# Patient Record
Sex: Female | Born: 1955 | Race: White | Hispanic: No | Marital: Married | Smoking: Never smoker
Health system: Southern US, Community
[De-identification: ages and names within clinical notes are randomized; demographics above are authoritative.]

## PROBLEM LIST (undated history)

## (undated) ENCOUNTER — Emergency Department (HOSPITAL_COMMUNITY): Payer: Medicare Other

## (undated) DIAGNOSIS — E079 Disorder of thyroid, unspecified: Secondary | ICD-10-CM

## (undated) DIAGNOSIS — F419 Anxiety disorder, unspecified: Secondary | ICD-10-CM

## (undated) DIAGNOSIS — M1990 Unspecified osteoarthritis, unspecified site: Secondary | ICD-10-CM

## (undated) DIAGNOSIS — I10 Essential (primary) hypertension: Secondary | ICD-10-CM

## (undated) DIAGNOSIS — R6889 Other general symptoms and signs: Secondary | ICD-10-CM

## (undated) DIAGNOSIS — R232 Flushing: Secondary | ICD-10-CM

## (undated) DIAGNOSIS — R002 Palpitations: Secondary | ICD-10-CM

## (undated) DIAGNOSIS — Z87891 Personal history of nicotine dependence: Secondary | ICD-10-CM

## (undated) DIAGNOSIS — R5383 Other fatigue: Secondary | ICD-10-CM

## (undated) DIAGNOSIS — R05 Cough: Secondary | ICD-10-CM

## (undated) HISTORY — DX: Cough: R05

## (undated) HISTORY — DX: Flushing: R23.2

## (undated) HISTORY — DX: Other general symptoms and signs: R68.89

## (undated) HISTORY — PX: NASAL SEPTUM SURGERY: SHX37

## (undated) HISTORY — DX: Disorder of thyroid, unspecified: E07.9

## (undated) HISTORY — PX: COLONOSCOPY: SHX174

## (undated) HISTORY — DX: Unspecified osteoarthritis, unspecified site: M19.90

## (undated) HISTORY — DX: Personal history of nicotine dependence: Z87.891

## (undated) HISTORY — DX: Anxiety disorder, unspecified: F41.9

## (undated) HISTORY — PX: ABDOMINAL HYSTERECTOMY: SHX81

## (undated) HISTORY — DX: Other fatigue: R53.83

## (undated) HISTORY — DX: Palpitations: R00.2

## (undated) HISTORY — DX: Essential (primary) hypertension: I10

---

## 1988-03-05 HISTORY — PX: PAROTIDECTOMY: SHX2163

## 1996-03-05 HISTORY — PX: OTHER SURGICAL HISTORY: SHX169

## 1997-08-31 ENCOUNTER — Other Ambulatory Visit: Admission: RE | Admit: 1997-08-31 | Discharge: 1997-08-31 | Payer: Self-pay | Admitting: Obstetrics and Gynecology

## 2003-10-28 ENCOUNTER — Emergency Department (HOSPITAL_COMMUNITY): Admission: EM | Admit: 2003-10-28 | Discharge: 2003-10-28 | Payer: Self-pay | Admitting: Emergency Medicine

## 2004-07-10 ENCOUNTER — Ambulatory Visit: Payer: Self-pay | Admitting: Internal Medicine

## 2004-07-18 ENCOUNTER — Ambulatory Visit (HOSPITAL_COMMUNITY): Admission: RE | Admit: 2004-07-18 | Discharge: 2004-07-18 | Payer: Self-pay | Admitting: Internal Medicine

## 2005-01-08 ENCOUNTER — Ambulatory Visit: Payer: Self-pay | Admitting: Internal Medicine

## 2005-01-11 ENCOUNTER — Ambulatory Visit: Payer: Self-pay | Admitting: Internal Medicine

## 2007-12-08 ENCOUNTER — Encounter: Admission: RE | Admit: 2007-12-08 | Discharge: 2007-12-08 | Payer: Self-pay | Admitting: Orthopedic Surgery

## 2008-01-04 HISTORY — PX: BACK SURGERY: SHX140

## 2008-01-21 ENCOUNTER — Ambulatory Visit: Payer: Self-pay | Admitting: *Deleted

## 2008-01-21 ENCOUNTER — Inpatient Hospital Stay (HOSPITAL_COMMUNITY): Admission: RE | Admit: 2008-01-21 | Discharge: 2008-01-23 | Payer: Self-pay | Admitting: Orthopedic Surgery

## 2008-01-22 ENCOUNTER — Encounter (INDEPENDENT_AMBULATORY_CARE_PROVIDER_SITE_OTHER): Payer: Self-pay | Admitting: Orthopedic Surgery

## 2008-02-03 ENCOUNTER — Encounter: Admission: RE | Admit: 2008-02-03 | Discharge: 2008-02-03 | Payer: Self-pay | Admitting: Orthopedic Surgery

## 2008-03-23 ENCOUNTER — Encounter: Admission: RE | Admit: 2008-03-23 | Discharge: 2008-05-06 | Payer: Self-pay | Admitting: Orthopedic Surgery

## 2008-09-23 ENCOUNTER — Encounter: Admission: RE | Admit: 2008-09-23 | Discharge: 2008-09-23 | Payer: Self-pay | Admitting: Cardiology

## 2010-01-31 ENCOUNTER — Encounter: Admission: RE | Admit: 2010-01-31 | Discharge: 2010-01-31 | Payer: Self-pay | Admitting: Obstetrics and Gynecology

## 2010-07-18 NOTE — Op Note (Signed)
NAMEYEE, GANGI NO.:  [id number]   MEDICAL RECORD NO.:  [id number]          PATIENT TYPE:  INP   LOCATION:  5007                         FACILITY:  [facility]   PHYSICIAN:  Alvy Beal, MD    DATE OF BIRTH:  [birth year]   DATE OF PROCEDURE:  01/21/2008  DATE OF DISCHARGE:                               OPERATIVE REPORT   PREOPERATIVE DIAGNOSIS:  Degenerative disk disease with slight  anterolisthesis L4-5.   POSTOPERATIVE DIAGNOSIS:  Degenerative disk disease with slight  anterolisthesis L4-5.   OPERATIVE PROCEDURE:  Anterior lumbar interbody fusion L4-5  instrumentation using a Stryker size 14 small interbody spacer with a 14-  mm RSB anterior plating device with 25-mm locking screws.   COMPLICATIONS:  None.   ESTIMATED BLOOD LOSS:  200 mL.   HISTORY:  This is a very pleasant 55 year old woman who has had chronic  debilitating low back pain secondary to degenerative disk disease with  instability and spinal stenosis.  The patient has tried and failed  prolonged conservative management consisting of physical therapy,  injection therapy, and narcotic medications.  Despite these modalities,  her symptoms progressed and her quality of life has suffered.  As a  result of this, we elected to proceed to surgery.  All appropriate  risks, benefits and alternatives were discussed with the patient.  Consent was obtained.   OPERATIVE NOTE:  The patient was brought to the operating room and  placed supine on the operating table.  After successful induction of  general anesthesia and endotracheal intubation, TEDs, SCDs and Foley  were applied.  The patient was flipped and then the abdomen was prepped  and draped in a standard fashion.  Dr. Madilyn Fireman, the vascular surgeon then  scrubbed in to perform a standard anterior approach to the spine.  Please refer to his dictation for specifics of the approach.  Once we  had the retractors in, we placed a needle into the L4-5  disk space, took  an x-ray confirming that we are at the appropriate level.  At this point  with the L4-5 level confirmed, I then incised the annulus with a 10  blade scalpel and began to resect the disk space.  Using a combination  of curettes, pituitary rongeurs and Kerrison rongeurs, I removed the  entire L4-5 disk material.  I then placed a lamina spreader and  distracted slightly to the posterior annulus.  I then used a fine angled  curette to strip the annulus from the posterior aspect of the bodies of  L4 and L5.  I then used a 3-mm Kerrison to resect the posterior  osteophyte.  At this point, I had excellent posterior decompression and  release of the annulus. Under live fluoro, I could clearly see parallel  distraction of the endplates.  I then rasped the endplates and measured  the interbody space.  Once I had the appropriate size, I obtained  hemostasis and then packed the appropriate size Stryker PEEK interbody  cage with Actifuse and used the threaded inserting device to place it to  the appropriate depth.  I confirmed trajectory in  position in the AP and  lateral planes.   Once the interbody device was properly seated, I used the remaining  portion of the Actifuse, packed it anterior to the plate anterior to the  interbody device for sentinel fusion.  I then obtained the RSB anterior  lumbar zero profile plate and positioned it appropriately over the  interbody graft.  I then used an awl to breach the cortex and placed two  screws under fluoroscopic guidance through the targeting device into the  body of L4.  I then placed a single screw into the body of L5 again  confirming trajectory in position in the lateral plane.  We had all  three screws properly locked.  I noted I had excellent purchase of the  screws and good lag fixation.  I then irrigated the wound copiously with  normal saline, put the anterior locking plate, secured it to the plate  to prevent back out of the  screws.   I then irrigated again copiously with normal saline and then removed the  retractors and checked to ensure that there was no significant  hemorrhage.  I used bipolar electrocautery to obtain hemostasis and  maintained it with FloSeal.  I then closed the fascia of the rectus  sheath with interrupted #1 Vicryl sutures and closed the adipose tissue  with a layered 0 Vicryl runner and interrupted 2-0 Vicryl sutures.  I  then used a 3-0 Monocryl for the skin.  Final AP and lateral fluoro  views demonstrated satisfactory position of the hardware and the  interbody device in both the lateral and AP planes.  I then took a final  intraoperative digital AP film which confirmed that there was no  retained fragments.  The needle and sponge counts were correct.  The  patient was ultimately extubated and transferred to the PACU without  incident.      Alvy Beal, MD  Electronically Signed     DDB/MEDQ  D:  01/21/2008  T:  01/21/2008  Job:  [id number]   cc:   Balinda Quails, M.D.

## 2010-07-18 NOTE — Op Note (Signed)
NAMEMarland Kitchen  SHAUNIKA, ITALIANO NO.:  [id number]   MEDICAL RECORD NO.:  [id number]          PATIENT TYPE:  INP   LOCATION:  5007                         FACILITY:  [facility]   PHYSICIAN:  Balinda Quails, M.D.    DATE OF BIRTH:  1955/06/19   DATE OF PROCEDURE:  01/21/2008  DATE OF DISCHARGE:                               OPERATIVE REPORT   SURGEON:  Balinda Quails, MD   CO-SURGEON:  Alvy Beal, MD   ANESTHETIC:  General endotracheal.   PREOPERATIVE DIAGNOSIS:  L4-5 degenerative disk disease.   POSTOPERATIVE DIAGNOSIS:  L4-5 degenerative disk disease.   PROCEDURE:  L4-5 anterior lumbar interbody fusion (ALIF).   CLINICAL NOTE:  Anna Moore is a 55 year old female scheduled this  time to undergo L4-5 ALIF.  The patient was evaluated preoperatively in  the holding area.  No contraindications to surgery.  Normal lower  extremity pulses.  No history of DVT or pulmonary embolism.   Procedure reviewed with the patient preoperatively and she consented for  surgery.  Potential risks were reviewed.   OPERATIVE PROCEDURE:  The patient was brought to the operating room in  stable condition.  Placed under general endotracheal anesthesia.  In the  supine position, the abdomen was prepped and draped in a sterile  fashion.  Foley catheter, arterial line, central venous catheter, and  pulse oximetry in the left foot.   A transverse skin incision was made from midline to the lateral margin  of the left rectus muscle at the projection of L4-5 and the anterior  abdominal wall.  Subcutaneous tissue divided by electrocautery.  Left  anterior rectus sheath was incised from midline to lateral margin of the  rectus.  Superior and inferior rectus sheath flaps were created.  The  rectus muscle was mobilized and retracted medially.  The left  retroperitoneal space was entered, the common peritoneum pushed off the  posterior rectus sheath, which was incised longitudinally.  The  peritoneum was then mobilized anteriorly and the psoas muscle and  genitofemoral nerve identified and preserved.  The left common and  external iliac artery was skeletonized, pushing the lymphatics  laterally.  The ureter was mobilized medially with abdominal contents.  The left common iliac vein was exposed and the left external iliac vein  was mobilized.  Three distinct iliolumbar branches of the common iliac  vein were identified, ligated with 2-0 silk and divided.  The L4  segmental vessels were ligated with clips and divided.  The L4-5 disk  was palpated and the soft tissue was pushed off the disk to the right.  Middle sacral vessel was ligated with clips and divided.  The disk was  fully exposed from left to right.   Reverse lip Brau blades were used to expose the disk, placing the blades  on the lateral margins of L4-5 bilaterally with malleable retractor was  placed inferiorly and superiorly.   The L4-5 disk was verified by fluoroscopy.   Dr. Shon Baton then completed L4-5 ALIF.  Details closure dictated under  separate heading by Dr. Shon Baton.      Balinda Quails, M.D.  Electronically Signed     PGH/MEDQ  D:  01/21/2008  T:  01/22/2008  Job:  [id number]   cc:   Alvy Beal, MD

## 2010-12-05 LAB — CBC
HCT: 44.7
Hemoglobin: 15
MCHC: 33.6
MCV: 96.4
Platelets: 296
RBC: 4.64
RDW: 13.2
WBC: 8.5

## 2010-12-05 LAB — TYPE AND SCREEN
ABO/RH(D): O POS
Antibody Screen: NEGATIVE

## 2010-12-05 LAB — ABO/RH: ABO/RH(D): O POS

## 2011-04-30 ENCOUNTER — Encounter: Payer: Self-pay | Admitting: Gastroenterology

## 2011-05-30 ENCOUNTER — Other Ambulatory Visit: Payer: Self-pay | Admitting: Gastroenterology

## 2011-08-07 ENCOUNTER — Encounter: Payer: Self-pay | Admitting: Cardiology

## 2012-04-30 ENCOUNTER — Ambulatory Visit (INDEPENDENT_AMBULATORY_CARE_PROVIDER_SITE_OTHER): Payer: No Typology Code available for payment source | Admitting: Family Medicine

## 2012-04-30 ENCOUNTER — Encounter: Payer: Self-pay | Admitting: Family Medicine

## 2012-04-30 ENCOUNTER — Encounter: Payer: Self-pay | Admitting: Gastroenterology

## 2012-04-30 VITALS — BP 150/88 | HR 88 | Temp 98.1°F | Wt 144.0 lb

## 2012-04-30 DIAGNOSIS — F419 Anxiety disorder, unspecified: Secondary | ICD-10-CM

## 2012-04-30 DIAGNOSIS — Z780 Asymptomatic menopausal state: Secondary | ICD-10-CM

## 2012-04-30 DIAGNOSIS — L301 Dyshidrosis [pompholyx]: Secondary | ICD-10-CM

## 2012-04-30 DIAGNOSIS — F17200 Nicotine dependence, unspecified, uncomplicated: Secondary | ICD-10-CM

## 2012-04-30 DIAGNOSIS — R002 Palpitations: Secondary | ICD-10-CM | POA: Insufficient documentation

## 2012-04-30 DIAGNOSIS — M1990 Unspecified osteoarthritis, unspecified site: Secondary | ICD-10-CM

## 2012-04-30 DIAGNOSIS — F411 Generalized anxiety disorder: Secondary | ICD-10-CM

## 2012-04-30 DIAGNOSIS — Z1211 Encounter for screening for malignant neoplasm of colon: Secondary | ICD-10-CM

## 2012-04-30 DIAGNOSIS — I10 Essential (primary) hypertension: Secondary | ICD-10-CM

## 2012-04-30 DIAGNOSIS — Z136 Encounter for screening for cardiovascular disorders: Secondary | ICD-10-CM

## 2012-04-30 DIAGNOSIS — N959 Unspecified menopausal and perimenopausal disorder: Secondary | ICD-10-CM

## 2012-04-30 DIAGNOSIS — Z720 Tobacco use: Secondary | ICD-10-CM

## 2012-04-30 LAB — COMPREHENSIVE METABOLIC PANEL
Alkaline Phosphatase: 62 U/L (ref 39–117)
BUN: 13 mg/dL (ref 6–23)
Creatinine, Ser: 0.7 mg/dL (ref 0.4–1.2)
Glucose, Bld: 92 mg/dL (ref 70–99)
Sodium: 138 mEq/L (ref 135–145)
Total Bilirubin: 0.8 mg/dL (ref 0.3–1.2)

## 2012-04-30 LAB — LIPID PANEL
Cholesterol: 212 mg/dL — ABNORMAL HIGH (ref 0–200)
HDL: 68.8 mg/dL (ref >39.00)
Total CHOL/HDL Ratio: 3
Triglycerides: 121 mg/dL (ref 0.0–149.0)
VLDL: 24.2 mg/dL (ref 0.0–40.0)

## 2012-04-30 MED ORDER — ESTRADIOL 1 MG PO TABS: 1.0000 mg | ORAL_TABLET | Freq: Every day | ORAL | Status: DC

## 2012-04-30 MED ORDER — HYDROCHLOROTHIAZIDE 12.5 MG PO CAPS: 12.5000 mg | ORAL_CAPSULE | Freq: Every day | ORAL | Status: DC

## 2012-04-30 MED ORDER — TRIAMCINOLONE ACETONIDE 0.1 % EX CREA: TOPICAL_CREAM | Freq: Two times a day (BID) | CUTANEOUS | Status: DC

## 2012-04-30 MED ORDER — AMLODIPINE BESYLATE 5 MG PO TABS: 5.0000 mg | ORAL_TABLET | Freq: Every day | ORAL | Status: DC

## 2012-04-30 NOTE — Progress Notes (Signed)
Subjective:    Patient ID: Anna Moore, female    DOB: October 23, 1955, 57 y.o.   MRN: [id number]  HPI  Very pleasant G1P1 here to establish care.  HTN- on HCTZ 12.5 mg and norvasc 5 mg daily. Could not tolerate lisinopril- "made me feel wiped out." Nervous about meeting me today, states normally BP runs in 130s/70s-80s.  No HA, blurred vision, CP or SOB.  She is currently menopausal- s/p hysterectomy but does still have ovaries.  On estrace- attempted to wean off but hot flashes and insomnia were unbearable. She does still smoke 1-2 cigarettes per week.  Rash on her heels bilaterally- itchy, peeling skin for past 6 months.  Moisturizing only helps a little bit.  Patient Active Problem List  Diagnosis  . Hypertension  . Anxiety  . DJD (degenerative joint disease)  . Palpitations  . Post menopausal problems  . Tobacco abuse  . Eczema, dyshidrotic   Past Medical History  Diagnosis Date  . Cough     Questionably ACE related  . Fatigue     Significant fatigue in the setting of back pain and diaphoresis  . History of tobacco abuse   . Fatigue   . Palpitations   . Forgetfulness   . Hot flashes   . Hypertension   . Anxiety   . DJD (degenerative joint disease)     Of the back   Past Surgical History  Procedure Laterality Date  . Back surgery  01/2008    On L4 and L5  . Other surgical history  1998    Hysterectomy  . Parotidectomy  1990  . Nasal septum surgery    . Abdominal hysterectomy     History  Substance Use Topics  . Smoking status: Current Some Day Smoker  . Smokeless tobacco: Not on file  . Alcohol Use: Yes   Family History  Problem Relation Age of Onset  . Arrhythmia Mother   . Hypertension Father   . Heart disease Father   . Heart attack Father     59's  . Cancer Father     non hodgkins lymphoma   No Known Allergies No current outpatient prescriptions on file prior to visit.   No current facility-administered medications on file prior to  visit.   The PMH, PSH, Social History, Family History, Medications, and allergies have been reviewed in [facility], and have been updated if relevant.   Review of Systems See HPI Patient reports no  vision/ hearing changes,anorexia, weight change, fever ,adenopathy, persistant / recurrent hoarseness, swallowing issues, chest pain, edema,persistant / recurrent cough, hemoptysis, dyspnea(rest, exertional, paroxysmal nocturnal), gastrointestinal  bleeding (melena, rectal bleeding), abdominal pain, excessive heart burn, GU symptoms(dysuria, hematuria, pyuria, voiding/incontinence  Issues) syncope, focal weakness, severe memory loss, concerning skin lesions, depression, anxiety, abnormal bruising/bleeding, major joint swelling, breast masses or abnormal vaginal bleeding.       Objective:   Physical Exam BP 150/88  Pulse 88  Temp(Src) 98.1 F (36.7 C)  Wt 144 lb (65.318 kg)  General:  Well-developed,well-nourished,in no acute distress; alert,appropriate and cooperative throughout examination Head:  normocephalic and atraumatic.   Eyes:  vision grossly intact, pupils equal, pupils round, and pupils reactive to light.   Ears:  R ear normal and L ear normal.   Nose:  no external deformity.   Mouth:  good dentition.   Neck:  No deformities, masses, or tenderness noted. Lungs:  Normal respiratory effort, chest expands symmetrically. Lungs are clear to auscultation, no crackles or wheezes. Heart:  Normal rate and regular rhythm. S1 and S2 normal without gallop, murmur, click, rub or other extra sounds. Abdomen:  Bowel sounds positive,abdomen soft and non-tender without masses, organomegaly or hernias noted. Msk:  No deformity or scoliosis noted of thoracic or lumbar spine.   Extremities:  No clubbing, cyanosis, edema, or deformity noted with normal full range of motion of all joints.   Neurologic:  alert & oriented X3 and gait normal.   Skin:  Dry peeling skin on heels bilaterally, no erythema or  drainage Cervical Nodes:  No lymphadenopathy noted Axillary Nodes:  No palpable lymphadenopathy Psych:  Cognition and judgment appear intact. Alert and cooperative with normal attention span and concentration. No apparent delusions, illusions, hallucinations     Assessment & Plan:  1. Hypertension A little elevated today, likely due to white coat HTN.  She is asymptomatic and will monitor BP at home.  Rxs refilled, no changes made. - Comprehensive metabolic panel  2. Screening for ischemic heart disease  - Lipid Panel  3. Post menopausal problems Unable to tolerate Estrace wean. Advised STOPPING cigarette smoking as it does increase her risk of blood clots. UTD on mammogram  4. Tobacco abuse See above  5. Special screening for malignant neoplasms, colon  - Ambulatory referral to Gastroenterology  6. Eczema, dyshidrotic Triamcinolone cream to area twice daily x 10 days or until area clears. Call or return to clinic prn if these symptoms worsen or fail to improve as anticipated. The patient indicates understanding of these issues and agrees with the plan.

## 2012-04-30 NOTE — Patient Instructions (Addendum)
Nice to meet you. We will call you with your lab results.  Please stop by to see Shirlee Limerick on your way out to set up your colonoscopy.

## 2012-05-01 ENCOUNTER — Encounter: Payer: Self-pay | Admitting: *Deleted

## 2012-05-19 ENCOUNTER — Ambulatory Visit (AMBULATORY_SURGERY_CENTER): Payer: No Typology Code available for payment source | Admitting: *Deleted

## 2012-05-19 VITALS — Ht 65.75 in | Wt 144.8 lb

## 2012-05-19 DIAGNOSIS — Z1211 Encounter for screening for malignant neoplasm of colon: Secondary | ICD-10-CM

## 2012-05-19 MED ORDER — NA SULFATE-K SULFATE-MG SULF 17.5-3.13-1.6 GM/177ML PO SOLN: ORAL | Status: DC

## 2012-05-23 ENCOUNTER — Telehealth: Payer: Self-pay

## 2012-05-23 MED ORDER — AMLODIPINE BESYLATE 10 MG PO TABS: ORAL_TABLET | ORAL | Status: DC

## 2012-05-23 MED ORDER — AMLODIPINE BESYLATE 10 MG PO TABS: 5.0000 mg | ORAL_TABLET | Freq: Every day | ORAL | Status: DC

## 2012-05-23 MED ORDER — ESTRADIOL 1 MG PO TABS: 1.0000 mg | ORAL_TABLET | Freq: Two times a day (BID) | ORAL | Status: DC

## 2012-05-23 MED ORDER — HYDROCHLOROTHIAZIDE 12.5 MG PO CAPS: 12.5000 mg | ORAL_CAPSULE | Freq: Every day | ORAL | Status: DC

## 2012-05-23 NOTE — Telephone Encounter (Signed)
Yes, let's increase her norvasc first to 10 mg daily, continue current dose of HCTZ.  Keep checking BP and call us back next week.  Rx sent to [facility].

## 2012-05-23 NOTE — Telephone Encounter (Signed)
Pt left v/m; pt seen on 04/30/12 and BP readings are higher and pt was to call back with readings and get amlodipine and HCTZ increased. Pt presently taking amlodipine 5 mg one daily and HCTZ 12.5 mg one daily. Once decision made if med needs to be increased pt request refill for both meds to [facility], Pts BP readings averaging 150-156 / 90-99. No h/a, dizziness, CP or SOB. Pt request call back.

## 2012-05-23 NOTE — Telephone Encounter (Signed)
Advised patient as instructed.  Amlodipine instructions changed on med list and new script sent to pharmacy.

## 2012-05-28 ENCOUNTER — Encounter: Payer: Self-pay | Admitting: Gastroenterology

## 2012-06-02 ENCOUNTER — Encounter: Payer: Self-pay | Admitting: Gastroenterology

## 2012-06-02 ENCOUNTER — Ambulatory Visit (AMBULATORY_SURGERY_CENTER): Payer: No Typology Code available for payment source | Admitting: Gastroenterology

## 2012-06-02 VITALS — BP 123/77 | HR 71 | Temp 96.5°F | Resp 24 | Ht 65.75 in | Wt 144.0 lb

## 2012-06-02 DIAGNOSIS — D126 Benign neoplasm of colon, unspecified: Secondary | ICD-10-CM

## 2012-06-02 DIAGNOSIS — Z1211 Encounter for screening for malignant neoplasm of colon: Secondary | ICD-10-CM

## 2012-06-02 MED ORDER — SODIUM CHLORIDE 0.9 % IV SOLN: 500.0000 mL | INTRAVENOUS | Status: DC

## 2012-06-02 NOTE — Patient Instructions (Addendum)
A handout was given to your care partner on polyps.  You may resume your current medications today.  Please call if any questions or concerns.    YOU HAD AN ENDOSCOPIC PROCEDURE TODAY AT THE [facility]: Refer to the procedure report that was given to you for any specific questions about what was found during the examination.  If the procedure report does not answer your questions, please call your gastroenterologist to clarify.  If you requested that your care partner not be given the details of your procedure findings, then the procedure report has been included in a sealed envelope for you to review at your convenience later.  YOU SHOULD EXPECT: Some feelings of bloating in the abdomen. Passage of more gas than usual.  Walking can help get rid of the air that was put into your GI tract during the procedure and reduce the bloating. If you had a lower endoscopy (such as a colonoscopy or flexible sigmoidoscopy) you may notice spotting of blood in your stool or on the toilet paper. If you underwent a bowel prep for your procedure, then you may not have a normal bowel movement for a few days.  DIET: Your first meal following the procedure should be a light meal and then it is ok to progress to your normal diet.  A half-sandwich or bowl of soup is an example of a good first meal.  Heavy or fried foods are harder to digest and may make you feel nauseous or bloated.  Likewise meals heavy in dairy and vegetables can cause extra gas to form and this can also increase the bloating.  Drink plenty of fluids but you should avoid alcoholic beverages for 24 hours.  ACTIVITY: Your care partner should take you home directly after the procedure.  You should plan to take it easy, moving slowly for the rest of the day.  You can resume normal activity the day after the procedure however you should NOT DRIVE or use heavy machinery for 24 hours (because of the sedation medicines used during the test).    SYMPTOMS  TO REPORT IMMEDIATELY: A gastroenterologist can be reached at any hour.  During normal business hours, 8:30 AM to 5:00 PM Monday through Friday, call [phone].  After hours and on weekends, please call the GI answering service at [phone] who will take a message and have the physician on call contact you.   Following lower endoscopy (colonoscopy or flexible sigmoidoscopy):  Excessive amounts of blood in the stool  Significant tenderness or worsening of abdominal pains  Swelling of the abdomen that is new, acute  Fever of 100F or higher    FOLLOW UP: If any biopsies were taken you will be contacted by phone or by letter within the next 1-3 weeks.  Call your gastroenterologist if you have not heard about the biopsies in 3 weeks.  Our staff will call the home number listed on your records the next business day following your procedure to check on you and address any questions or concerns that you may have at that time regarding the information given to you following your procedure. This is a courtesy call and so if there is no answer at the home number and we have not heard from you through the emergency physician on call, we will assume that you have returned to your regular daily activities without incident.  SIGNATURES/CONFIDENTIALITY: You and/or your care partner have signed paperwork which will be entered into your electronic medical record.    These signatures attest to the fact that that the information above on your After Visit Summary has been reviewed and is understood.  Full responsibility of the confidentiality of this discharge information lies with you and/or your care-partner.  

## 2012-06-02 NOTE — Progress Notes (Signed)
1047patient watching procedure denies discomfort.

## 2012-06-02 NOTE — Progress Notes (Signed)
No complaints noted in the recovery room. Maw  Patient did not experience any of the following events: a burn prior to discharge; a fall within the facility; wrong site/side/patient/procedure/implant event; or a hospital transfer or hospital admission upon discharge from the facility. (G8907) Patient did not have preoperative order for IV antibiotic SSI prophylaxis. (G8918)  

## 2012-06-02 NOTE — Op Note (Signed)
[facility] [address] [place], [address]   COLONOSCOPY PROCEDURE REPORT  PATIENT: Anna Moore, Anna Moore  MR#: [id number] BIRTHDATE: 02/19/1956 , 56  yrs. old GENDER: Female ENDOSCOPIST: Rachael Fee, MD REFERRED WG:NFAOZ Aron, M.D. PROCEDURE DATE:  06/02/2012 PROCEDURE:   Colonoscopy with snare polypectomy ASA CLASS:   Class II INDICATIONS:average risk screening. MEDICATIONS: Fentanyl 75 mcg IV, Versed 8 mg IV, and These medications were titrated to patient response per physician's verbal order  DESCRIPTION OF PROCEDURE:   After the risks benefits and alternatives of the procedure were thoroughly explained, informed consent was obtained.  A digital rectal exam revealed no abnormalities of the rectum.   The LB [id number]-[id number]  endoscope was introduced through the anus and advanced to the cecum, which was identified by both the appendix and ileocecal valve. No adverse events experienced.   The quality of the prep was good.  The instrument was then slowly withdrawn as the colon was fully examined.  COLON FINDINGS: Three polyps were found, removed and all were sent to pathology.  One was sessile, ascending segment, 8mm across, removed with cold snare, sent to pathology (jar 1).  One was sessile 3mm across, sigmoid segment, cold snare, pathology jar 1. The last was pedunculated, 10mm, sigmoid segment, removed with snare/cautery, pathology jar 2.  The examination was otherwise normal.  Retroflexed views revealed no abnormalities. The time to cecum=4 minutes 12 seconds.  Withdrawal time=11 minutes 42 seconds. The scope was withdrawn and the procedure completed. COMPLICATIONS: There were no complications.  ENDOSCOPIC IMPRESSION: Three polyps were found, removed and all were sent to pathology. The examination was otherwise normal.  RECOMMENDATIONS: If the polyp(s) removed today are proven to be adenomatous (pre-cancerous) polyps, you will need a colonoscopy  in 3 years. Otherwise you should continue to follow colorectal cancer screening guidelines for "routine risk" patients with a colonoscopy in 10 years.  You will receive a letter within 1-2 weeks with the results of your biopsy as well as final recommendations.  Please call my office if you have not received a letter after 3 weeks.   eSigned:  Rachael Fee, MD 06/02/2012 10:55 AM

## 2012-06-03 ENCOUNTER — Telehealth: Payer: Self-pay | Admitting: *Deleted

## 2012-06-03 NOTE — Telephone Encounter (Signed)
No identifier, left message, follow-up  

## 2012-06-06 ENCOUNTER — Encounter: Payer: Self-pay | Admitting: Gastroenterology

## 2012-12-22 ENCOUNTER — Other Ambulatory Visit: Payer: Self-pay | Admitting: *Deleted

## 2012-12-22 MED ORDER — AMLODIPINE BESYLATE 10 MG PO TABS: ORAL_TABLET | ORAL | Status: DC

## 2013-02-13 ENCOUNTER — Encounter: Payer: Self-pay | Admitting: Family Medicine

## 2013-05-26 ENCOUNTER — Other Ambulatory Visit: Payer: Self-pay | Admitting: *Deleted

## 2013-05-26 MED ORDER — ESTRADIOL 1 MG PO TABS: 1.0000 mg | ORAL_TABLET | Freq: Two times a day (BID) | ORAL | Status: DC

## 2013-05-26 MED ORDER — HYDROCHLOROTHIAZIDE 12.5 MG PO CAPS: 12.5000 mg | ORAL_CAPSULE | Freq: Every day | ORAL | Status: DC

## 2013-05-26 NOTE — Addendum Note (Signed)
Addended by: Daralene Milch C on: 05/26/2013 11:57 AM   Modules accepted: Orders

## 2013-06-22 ENCOUNTER — Other Ambulatory Visit: Payer: Self-pay | Admitting: *Deleted

## 2013-06-22 MED ORDER — AMLODIPINE BESYLATE 10 MG PO TABS: ORAL_TABLET | ORAL | Status: DC

## 2013-07-16 ENCOUNTER — Encounter: Payer: Self-pay | Admitting: Family Medicine

## 2013-07-16 ENCOUNTER — Ambulatory Visit (INDEPENDENT_AMBULATORY_CARE_PROVIDER_SITE_OTHER): Payer: No Typology Code available for payment source | Admitting: Family Medicine

## 2013-07-16 ENCOUNTER — Ambulatory Visit (INDEPENDENT_AMBULATORY_CARE_PROVIDER_SITE_OTHER)
Admission: RE | Admit: 2013-07-16 | Discharge: 2013-07-16 | Disposition: A | Discharge status: Home / Self Care | Payer: No Typology Code available for payment source | Source: Ambulatory Visit | Attending: Family Medicine | Admitting: Family Medicine

## 2013-07-16 VITALS — BP 120/74 | HR 75 | Temp 98.0°F | Ht 65.5 in | Wt 136.5 lb

## 2013-07-16 DIAGNOSIS — I10 Essential (primary) hypertension: Secondary | ICD-10-CM

## 2013-07-16 DIAGNOSIS — Z0000 Encounter for general adult medical examination without abnormal findings: Secondary | ICD-10-CM

## 2013-07-16 DIAGNOSIS — M1990 Unspecified osteoarthritis, unspecified site: Secondary | ICD-10-CM

## 2013-07-16 DIAGNOSIS — R5383 Other fatigue: Secondary | ICD-10-CM | POA: Insufficient documentation

## 2013-07-16 DIAGNOSIS — F17200 Nicotine dependence, unspecified, uncomplicated: Secondary | ICD-10-CM

## 2013-07-16 DIAGNOSIS — R5381 Other malaise: Secondary | ICD-10-CM

## 2013-07-16 DIAGNOSIS — Z136 Encounter for screening for cardiovascular disorders: Secondary | ICD-10-CM

## 2013-07-16 DIAGNOSIS — F419 Anxiety disorder, unspecified: Secondary | ICD-10-CM

## 2013-07-16 DIAGNOSIS — Z720 Tobacco use: Secondary | ICD-10-CM

## 2013-07-16 DIAGNOSIS — F411 Generalized anxiety disorder: Secondary | ICD-10-CM

## 2013-07-16 DIAGNOSIS — R079 Chest pain, unspecified: Secondary | ICD-10-CM

## 2013-07-16 DIAGNOSIS — H6120 Impacted cerumen, unspecified ear: Secondary | ICD-10-CM

## 2013-07-16 DIAGNOSIS — G4700 Insomnia, unspecified: Secondary | ICD-10-CM | POA: Insufficient documentation

## 2013-07-16 LAB — CBC WITH DIFFERENTIAL/PLATELET
Basophils Absolute: 0 10*3/uL (ref 0.0–0.1)
Basophils Relative: 0.3 % (ref 0.0–3.0)
EOS PCT: 0.6 % (ref 0.0–5.0)
Eosinophils Absolute: 0.1 10*3/uL (ref 0.0–0.7)
HEMATOCRIT: 48.7 % — AB (ref 36.0–46.0)
Hemoglobin: 16.3 g/dL — ABNORMAL HIGH (ref 12.0–15.0)
LYMPHS ABS: 1.6 10*3/uL (ref 0.7–4.0)
Lymphocytes Relative: 12.8 % (ref 12.0–46.0)
MCHC: 33.5 g/dL (ref 30.0–36.0)
MCV: 94.4 fl (ref 78.0–100.0)
MONO ABS: 0.6 10*3/uL (ref 0.1–1.0)
Monocytes Relative: 4.9 % (ref 3.0–12.0)
Neutro Abs: 10 10*3/uL — ABNORMAL HIGH (ref 1.4–7.7)
Neutrophils Relative %: 81.4 % — ABNORMAL HIGH (ref 43.0–77.0)
Platelets: 382 10*3/uL (ref 150.0–400.0)
RBC: 5.16 Mil/uL — ABNORMAL HIGH (ref 3.87–5.11)
RDW: 14.1 % (ref 11.5–15.5)
WBC: 12.3 10*3/uL — ABNORMAL HIGH (ref 4.0–10.5)

## 2013-07-16 LAB — COMPREHENSIVE METABOLIC PANEL
ALT: 23 U/L (ref 0–35)
AST: 21 U/L (ref 0–37)
Albumin: 4.8 g/dL (ref 3.5–5.2)
Alkaline Phosphatase: 72 U/L (ref 39–117)
BILIRUBIN TOTAL: 0.7 mg/dL (ref 0.2–1.2)
BUN: 19 mg/dL (ref 6–23)
CO2: 29 meq/L (ref 19–32)
CREATININE: 0.7 mg/dL (ref 0.4–1.2)
Calcium: 10.1 mg/dL (ref 8.4–10.5)
Chloride: 99 mEq/L (ref 96–112)
GFR: 91.23 mL/min (ref >60.00)
GLUCOSE: 103 mg/dL — AB (ref 70–99)
Potassium: 3.7 mEq/L (ref 3.5–5.1)
Sodium: 137 mEq/L (ref 135–145)
Total Protein: 7.7 g/dL (ref 6.0–8.3)

## 2013-07-16 LAB — TSH: TSH: 0.13 u[IU]/mL — ABNORMAL LOW (ref 0.35–4.50)

## 2013-07-16 LAB — VITAMIN B12: Vitamin B-12: 360 pg/mL (ref 211–911)

## 2013-07-16 LAB — LIPID PANEL
CHOLESTEROL: 243 mg/dL — AB (ref 0–200)
HDL: 80 mg/dL (ref >39.00)
LDL Cholesterol: 140 mg/dL — ABNORMAL HIGH (ref 0–99)
TRIGLYCERIDES: 113 mg/dL (ref 0.0–149.0)
Total CHOL/HDL Ratio: 3
VLDL: 22.6 mg/dL (ref 0.0–40.0)

## 2013-07-16 MED ORDER — TRAZODONE HCL 50 MG PO TABS: 25.0000 mg | ORAL_TABLET | Freq: Every evening | ORAL | Status: DC | PRN

## 2013-07-16 MED ORDER — HYDROCHLOROTHIAZIDE 12.5 MG PO CAPS: 12.5000 mg | ORAL_CAPSULE | Freq: Every day | ORAL | Status: DC

## 2013-07-16 MED ORDER — AMLODIPINE BESYLATE 10 MG PO TABS: ORAL_TABLET | ORAL | Status: DC

## 2013-07-16 MED ORDER — ESTRADIOL 1 MG PO TABS: 1.0000 mg | ORAL_TABLET | Freq: Two times a day (BID) | ORAL | Status: DC

## 2013-07-16 NOTE — Addendum Note (Signed)
Addended by: Lucille Passy on: 07/16/2013 09:12 AM   Modules accepted: Level of Service

## 2013-07-16 NOTE — Assessment & Plan Note (Signed)
Likely multifactorial.  Will check labs today.

## 2013-07-16 NOTE — Progress Notes (Addendum)
Subjective:    Patient ID: Anna Moore, female    DOB: [birth year], 58 y.o.   MRN: [id number]  HPI  Very pleasant G1P1 here for CPX.  I have not seen her since she established care with me in 04/2012.  Colonoscopy 06/02/12 Anna Moore)- 3 year recall. Mammogram 02/12/13 Pap smear 06/17/09 (Anna Moore- scanned in [place]). S/p hysterectomy.   Fatigue- primary caregiver for her mom and dad who are aging, have dementia.  She has been struggling and thinking about putting them in SNF.  She is not sure if fatigue is associated with this.  Does not feel depressed.  Not sleeping well- has had trouble staying asleep for years.  Seems worse now that she thinks she is menopausal.  Chest pain- feels tight band- intermittent- can occur at rest or with exertion.  She is very physically active but feels the pain more when she is resting. +nausea +FH of CAD- dad had CABG in his 55s. Lab Results  Component Value Date   CHOL 212* 04/30/2012   HDL 68.80 04/30/2012   LDLDIRECT 127.1 04/30/2012   TRIG 121.0 04/30/2012   CHOLHDL 3 04/30/2012    HTN- on HCTZ 12.5 mg and norvasc 5 mg daily. Could not tolerate lisinopril- "made me feel wiped out."  No HA, blurred vision, CP or SOB.  She is currently menopausal- s/p hysterectomy but does still have ovaries.  On estrace- attempted to wean off but hot flashes and insomnia were unbearable.  Tobacco abuse- She does still smoke 1-2 cigarettes per week.   Patient Active Problem List   Diagnosis Date Noted  . Routine general medical examination at a health care facility 07/16/2013  . Chest pain 07/16/2013  . Post menopausal problems 04/30/2012  . Tobacco abuse 04/30/2012  . Eczema, dyshidrotic 04/30/2012  . Hypertension   . Anxiety   . DJD (degenerative joint disease)   . Palpitations    Past Medical History  Diagnosis Date  . Cough     Questionably ACE related  . Fatigue     Significant fatigue in the setting of back pain and diaphoresis  .  History of tobacco abuse   . Fatigue   . Palpitations   . Forgetfulness   . Hot flashes   . Hypertension   . Anxiety   . DJD (degenerative joint disease)     Of the back   Past Surgical History  Procedure Laterality Date  . Back surgery  01/2008    On L4 and L5  . Other surgical history  1998    Hysterectomy  . Parotidectomy  1990  . Nasal septum surgery    . Abdominal hysterectomy     History  Substance Use Topics  . Smoking status: Current Some Day Smoker    Types: Cigarettes  . Smokeless tobacco: Never Used  . Alcohol Use: 3.6 oz/week    6 Cans of beer per week   Family History  Problem Relation Age of Onset  . Arrhythmia Mother   . Hypertension Father   . Heart disease Father   . Heart attack Father     27's  . Cancer Father     non hodgkins lymphoma  . Colon cancer Neg Hx    No Known Allergies Current Outpatient Prescriptions on File Prior to Visit  Medication Sig Dispense Refill  . aspirin 81 MG tablet Take 81 mg by mouth every other day.      . estradiol (ESTRACE) 1 MG tablet Take 1 tablet (  1 mg total) by mouth 2 (two) times daily.  60 tablet  2   No current facility-administered medications on file prior to visit.   The PMH, PSH, Social History, Family History, Medications, and allergies have been reviewed in [facility], and have been updated if relevant.   Review of Systems See HPI No DOE No LE edema +anxiety, no depression + insomnia No changes in her bowel habits or blood in her stool Denies classic symptoms of GERD    Objective:   Physical Exam BP 120/74  Pulse 75  Temp(Src) 98 F (36.7 C) (Oral)  Ht 5' 5.5" (1.664 m)  Wt 136 lb 8 oz (61.916 kg)  BMI 22.36 kg/m2  SpO2 97%   General:  Well-developed,well-nourished,in no acute distress; alert,appropriate and cooperative throughout examination Head:  normocephalic and atraumatic.   Eyes:  vision grossly intact, pupils equal, pupils round, and pupils reactive to light.   Ears: cerumen  impaction right. Nose:  no external deformity.   Mouth:  good dentition.   Neck:  No deformities, masses, or tenderness noted. Breasts:  No mass, nodules, thickening, tenderness, bulging, retraction, inflamation, nipple discharge or skin changes noted.   Lungs:  Normal respiratory effort, chest expands symmetrically. Lungs are clear to auscultation, no crackles or wheezes. Heart:  Normal rate and regular rhythm. S1 and S2 normal without gallop, murmur, click, rub or other extra sounds. Abdomen:  Bowel sounds positive,abdomen soft and non-tender without masses, organomegaly or hernias noted.  Msk:  No deformity or scoliosis noted of thoracic or lumbar spine.   Extremities:  No clubbing, cyanosis, edema, or deformity noted with normal full range of motion of all joints.   Neurologic:  alert & oriented X3 and gait normal.   Skin:  Intact without suspicious lesions or rashes Cervical Nodes:  No lymphadenopathy noted Axillary Nodes:  No palpable lymphadenopathy Psych:  Cognition and judgment appear intact. Alert and cooperative with normal attention span and concentration. No apparent delusions, illusions, hallucinations      Assessment & Plan:

## 2013-07-16 NOTE — Assessment & Plan Note (Signed)
>  25 min spent with patient, at least half of which was spent on counseling insomnia.  The problem of recurrent insomnia is discussed. Avoidance of caffeine sources is strongly encouraged. Sleep hygiene issues are reviewed.   Start Trazadone prn qhs insomnia.

## 2013-07-16 NOTE — Assessment & Plan Note (Addendum)
Reviewed preventive care protocols, scheduled due services, and updated immunizations Discussed nutrition, exercise, diet, and healthy lifestyle.  Orders Placed This Encounter  Procedures  . CBC with Differential  . Comprehensive metabolic panel  . Lipid panel  . TSH  . Vitamin B12  . Vitamin D, 25-hydroxy  . EKG 12-Lead

## 2013-07-16 NOTE — Assessment & Plan Note (Addendum)
Atypical but does have risk factors for CAD- FH, tobacco abuse, HTN. EKG reassuring- NSR. Unlikely cardiac but if lab work and xray normal and pain continues, will refer to cardiology.

## 2013-07-16 NOTE — Addendum Note (Signed)
Addended by: Ellamae Sia on: 07/16/2013 09:35 AM   Modules accepted: Orders

## 2013-07-16 NOTE — Assessment & Plan Note (Signed)
Ceruminosis is noted.  Wax is removed by syringing and manual debridement. Instructions for home care to prevent wax buildup are given.  

## 2013-07-16 NOTE — Assessment & Plan Note (Signed)
Stable on current rx.  

## 2013-07-16 NOTE — Patient Instructions (Addendum)
Great to see you. I will call you with your lab results.  Let's try Trazadone at bedtime. Let me know how this works.  I will call you with your chest xray results as well.

## 2013-07-16 NOTE — Progress Notes (Signed)
Pre visit review using our clinic review tool, if applicable. No additional management support is needed unless otherwise documented below in the visit note. 

## 2013-07-17 ENCOUNTER — Other Ambulatory Visit: Payer: Self-pay | Admitting: Family Medicine

## 2013-07-17 DIAGNOSIS — R7989 Other specified abnormal findings of blood chemistry: Secondary | ICD-10-CM | POA: Insufficient documentation

## 2013-07-17 LAB — VITAMIN D 25 HYDROXY (VIT D DEFICIENCY, FRACTURES): Vit D, 25-Hydroxy: 57 ng/mL (ref 30–89)

## 2013-07-21 ENCOUNTER — Other Ambulatory Visit (INDEPENDENT_AMBULATORY_CARE_PROVIDER_SITE_OTHER): Payer: No Typology Code available for payment source

## 2013-07-21 DIAGNOSIS — R946 Abnormal results of thyroid function studies: Secondary | ICD-10-CM

## 2013-07-21 DIAGNOSIS — R7989 Other specified abnormal findings of blood chemistry: Secondary | ICD-10-CM

## 2013-07-21 LAB — CBC WITH DIFFERENTIAL/PLATELET
BASOS ABS: 0 10*3/uL (ref 0.0–0.1)
Basophils Relative: 0.5 % (ref 0.0–3.0)
EOS PCT: 1.4 % (ref 0.0–5.0)
Eosinophils Absolute: 0.1 10*3/uL (ref 0.0–0.7)
HEMATOCRIT: 42 % (ref 36.0–46.0)
Hemoglobin: 14.1 g/dL (ref 12.0–15.0)
LYMPHS PCT: 28.1 % (ref 12.0–46.0)
Lymphs Abs: 2.2 10*3/uL (ref 0.7–4.0)
MCHC: 33.6 g/dL (ref 30.0–36.0)
MCV: 93.7 fl (ref 78.0–100.0)
MONOS PCT: 7 % (ref 3.0–12.0)
Monocytes Absolute: 0.6 10*3/uL (ref 0.1–1.0)
Neutro Abs: 5 10*3/uL (ref 1.4–7.7)
Neutrophils Relative %: 63 % (ref 43.0–77.0)
PLATELETS: 336 10*3/uL (ref 150.0–400.0)
RBC: 4.48 Mil/uL (ref 3.87–5.11)
RDW: 13.9 % (ref 11.5–15.5)
WBC: 7.9 10*3/uL (ref 4.0–10.5)

## 2013-07-21 LAB — T3, FREE: T3 FREE: 2.8 pg/mL (ref 2.3–4.2)

## 2013-07-21 LAB — TSH: TSH: 0.14 u[IU]/mL — AB (ref 0.35–4.50)

## 2013-07-21 LAB — T4, FREE: FREE T4: 0.94 ng/dL (ref 0.60–1.60)

## 2013-07-22 ENCOUNTER — Other Ambulatory Visit: Payer: Self-pay | Admitting: Family Medicine

## 2013-07-22 DIAGNOSIS — R7989 Other specified abnormal findings of blood chemistry: Secondary | ICD-10-CM

## 2013-09-01 ENCOUNTER — Ambulatory Visit (INDEPENDENT_AMBULATORY_CARE_PROVIDER_SITE_OTHER): Payer: No Typology Code available for payment source | Admitting: Endocrinology

## 2013-09-01 ENCOUNTER — Encounter: Payer: Self-pay | Admitting: Endocrinology

## 2013-09-01 VITALS — BP 132/88 | HR 73 | Temp 98.1°F | Ht 65.5 in | Wt 142.0 lb

## 2013-09-01 DIAGNOSIS — R7989 Other specified abnormal findings of blood chemistry: Secondary | ICD-10-CM

## 2013-09-01 DIAGNOSIS — R946 Abnormal results of thyroid function studies: Secondary | ICD-10-CM

## 2013-09-01 MED ORDER — METHIMAZOLE 5 MG PO TABS: 5.0000 mg | ORAL_TABLET | Freq: Every day | ORAL | Status: DC

## 2013-09-01 NOTE — Progress Notes (Signed)
Subjective:    Patient ID: Anna Moore, female    DOB: October 05, 1955, 58 y.o.   MRN: [id number]  HPI Pt reports he was dx'ed with hyperthyroidism in early 2015, on a routine blood test.  she has never been on therapy for this.  she has never had XRT to the anterior neck, or thyroid surgery.  she has never had thyroid imaging.  she does not consume kelp or any other prescribed or non-prescribed thyroid medication.  she has never been on amiodarone.  She reports moderate sweating, throughout the body, in the context of sleep.  She has assoc heat intolerance.   Past Medical History  Diagnosis Date  . Cough     Questionably ACE related  . Fatigue     Significant fatigue in the setting of back pain and diaphoresis  . History of tobacco abuse   . Fatigue   . Palpitations   . Forgetfulness   . Hot flashes   . Hypertension   . Anxiety   . DJD (degenerative joint disease)     Of the back    Past Surgical History  Procedure Laterality Date  . Back surgery  01/2008    On L4 and L5  . Other surgical history  1998    Hysterectomy  . Parotidectomy  1990  . Nasal septum surgery    . Abdominal hysterectomy      History   Social History  . Marital Status: Married    Spouse Name: N/A    Number of Children: N/A  . Years of Education: N/A   Occupational History  . Not on file.   Social History Main Topics  . Smoking status: Current Some Day Smoker    Types: Cigarettes  . Smokeless tobacco: Never Used  . Alcohol Use: 3.6 oz/week    6 Cans of beer per week  . Drug Use: No  . Sexual Activity: Not on file   Other Topics Concern  . Not on file   Social History Narrative   Married.  One daughter, 44 yo- moving back in with her.   Retired Medical illustrator.    Current Outpatient Prescriptions on File Prior to Visit  Medication Sig Dispense Refill  . amLODipine (NORVASC) 10 MG tablet Take one tablet by mouth daily.  30 tablet  5  . aspirin 81 MG tablet Take 81 mg by mouth  every other day.      . estradiol (ESTRACE) 1 MG tablet Take 1 tablet (1 mg total) by mouth 2 (two) times daily.  60 tablet  2  . hydrochlorothiazide (MICROZIDE) 12.5 MG capsule Take 1 capsule (12.5 mg total) by mouth daily.  30 capsule  2  . traZODone (DESYREL) 50 MG tablet Take 0.5-1 tablets (25-50 mg total) by mouth at bedtime as needed for sleep.  30 tablet  3   No current facility-administered medications on file prior to visit.    No Known Allergies  Family History  Problem Relation Age of Onset  . Arrhythmia Mother   . Hypertension Father   . Heart disease Father   . Heart attack Father     71's  . Cancer Father     non hodgkins lymphoma  . Colon cancer Neg Hx     BP 132/88  Pulse 73  Temp(Src) 98.1 F (36.7 C) (Oral)  Ht 5' 5.5" (1.664 m)  Wt 142 lb (64.411 kg)  BMI 23.26 kg/m2  SpO2 98%  Review of Systems denies weight loss,  headache, hoarseness, double vision, palpitations, sob, diarrhea, polyuria, itching, numbness, tremor, hypoglycemia, and rhinorrhea.  She has arthralgias, easy bruising, and anxiety.     Objective:   Physical Exam VS: see vs page GEN: no distress HEAD: head: no deformity eyes: no periorbital swelling, no proptosis external nose and ears are normal mouth: no lesion seen NECK: supple, thyroid is not enlarged CHEST WALL: no deformity LUNGS:  Clear to auscultation CV: reg rate and rhythm, no murmur ABD: abdomen is soft, nontender.  no hepatosplenomegaly.  not distended.  no hernia.   MUSCULOSKELETAL: muscle bulk and strength are grossly normal.  no obvious joint swelling.  gait is normal and steady EXTEMITIES: no deformity.  no ulcer on the feet.  feet are of normal color and temp.  no edema PULSES: dorsalis pedis intact bilat.  no carotid bruit NEURO:  cn 2-12 grossly intact.   readily moves all 4's.  sensation is intact to touch on the feet.  No tremor.   SKIN:  Normal texture and temperature.  No rash or suspicious lesion is visible.   Not diaphoretic.  NODES:  None palpable at the neck.   PSYCH: alert, well-oriented.  Does not appear anxious nor depressed.    Lab Results  Component Value Date   TSH 0.14* 07/21/2013      Assessment & Plan:  Hyperthyroidism, new to me.  At this TSH level, usually due to small multinodular goiter.     Anxiety: unlikely thyroid-related.   Diaphoresis: unlikely thyroid-related.     Patient is advised the following: Patient Instructions  i have sent a prescription to your pharmacy, for a pill to slow down the thyroid. if ever you have fever while taking methimazole, stop it and call us, because of the risk of a rare side-effect.   You should consider taking the trazodone pill that Dr Deborra Medina recommended.   Please come back for a follow-up appointment in 2-4 weeks.

## 2013-09-01 NOTE — Patient Instructions (Addendum)
i have sent a prescription to your pharmacy, for a pill to slow down the thyroid. if ever you have fever while taking methimazole, stop it and call us, because of the risk of a rare side-effect.   You should consider taking the trazodone pill that Dr Deborra Medina recommended.   Please come back for a follow-up appointment in 2-4 weeks.

## 2013-09-22 ENCOUNTER — Ambulatory Visit (INDEPENDENT_AMBULATORY_CARE_PROVIDER_SITE_OTHER): Payer: No Typology Code available for payment source | Admitting: Endocrinology

## 2013-09-22 ENCOUNTER — Encounter: Payer: Self-pay | Admitting: Endocrinology

## 2013-09-22 VITALS — BP 134/74 | HR 75 | Temp 98.1°F | Ht 65.5 in | Wt 142.0 lb

## 2013-09-22 DIAGNOSIS — E0590 Thyrotoxicosis, unspecified without thyrotoxic crisis or storm: Secondary | ICD-10-CM | POA: Insufficient documentation

## 2013-09-22 LAB — TSH: TSH: 0.3 u[IU]/mL — ABNORMAL LOW (ref 0.35–4.50)

## 2013-09-22 LAB — T4, FREE: FREE T4: 1.05 ng/dL (ref 0.60–1.60)

## 2013-09-22 NOTE — Patient Instructions (Addendum)
Thyroid blood tests are being requested for you today.  We'll contact you with results.  if ever you have fever while taking methimazole, stop it and call us, because of the risk of a rare side-effect.   Please come back for a follow-up appointment in 6 weeks  

## 2013-09-22 NOTE — Progress Notes (Signed)
Subjective:    Patient ID: Anna Moore, female    DOB: 1955/09/03, 58 y.o.   MRN: [id number]  HPI Pt returns for f/u of hyperthyroidism (dx'ed early 2015, on a routine blood test; tapazole was chosen as rx, as it is mild; she has never had thyroid imaging). Since on the methimazole, pt states she feels no different, and well in general.  Past Medical History  Diagnosis Date  . Cough     Questionably ACE related  . Fatigue     Significant fatigue in the setting of back pain and diaphoresis  . History of tobacco abuse   . Fatigue   . Palpitations   . Forgetfulness   . Hot flashes   . Hypertension   . Anxiety   . DJD (degenerative joint disease)     Of the back    Past Surgical History  Procedure Laterality Date  . Back surgery  01/2008    On L4 and L5  . Other surgical history  1998    Hysterectomy  . Parotidectomy  1990  . Nasal septum surgery    . Abdominal hysterectomy      History   Social History  . Marital Status: Married    Spouse Name: N/A    Number of Children: N/A  . Years of Education: N/A   Occupational History  . Not on file.   Social History Main Topics  . Smoking status: Current Some Day Smoker    Types: Cigarettes  . Smokeless tobacco: Never Used  . Alcohol Use: 3.6 oz/week    6 Cans of beer per week  . Drug Use: No  . Sexual Activity: Not on file   Other Topics Concern  . Not on file   Social History Narrative   Married.  One daughter, 66 yo- moving back in with her.   Retired Medical illustrator.    Current Outpatient Prescriptions on File Prior to Visit  Medication Sig Dispense Refill  . amLODipine (NORVASC) 10 MG tablet Take one tablet by mouth daily.  30 tablet  5  . aspirin 81 MG tablet Take 81 mg by mouth every other day.      . estradiol (ESTRACE) 1 MG tablet Take 1 tablet (1 mg total) by mouth 2 (two) times daily.  60 tablet  2  . hydrochlorothiazide (MICROZIDE) 12.5 MG capsule Take 1 capsule (12.5 mg total) by mouth daily.   30 capsule  2  . methimazole (TAPAZOLE) 5 MG tablet Take 1 tablet (5 mg total) by mouth daily.  30 tablet  2  . traZODone (DESYREL) 50 MG tablet Take 0.5-1 tablets (25-50 mg total) by mouth at bedtime as needed for sleep.  30 tablet  3   No current facility-administered medications on file prior to visit.    No Known Allergies  Family History  Problem Relation Age of Onset  . Arrhythmia Mother   . Hypertension Father   . Heart disease Father   . Heart attack Father     61's  . Cancer Father     non hodgkins lymphoma  . Colon cancer Neg Hx   . Thyroid disease Neg Hx     BP 134/74  Pulse 75  Temp(Src) 98.1 F (36.7 C) (Oral)  Ht 5' 5.5" (1.664 m)  Wt 142 lb (64.411 kg)  BMI 23.26 kg/m2  SpO2 97%  Review of Systems Denies fever.    Objective:   Physical Exam VITAL SIGNS:  See vs page GENERAL:  no distress Skin: not diaphoretic.   Neuro: no tremor.    Lab Results  Component Value Date   TSH 0.30* 09/22/2013       Assessment & Plan:  Hyperthyroidism, improved.   Patient is advised the following: Patient Instructions  Thyroid blood tests are being requested for you today.  We'll contact you with results. if ever you have fever while taking methimazole, stop it and call us, because of the risk of a rare side-effect.   Please come back for a follow-up appointment in 6 weeks.

## 2013-11-03 ENCOUNTER — Encounter: Payer: Self-pay | Admitting: Endocrinology

## 2013-11-03 ENCOUNTER — Ambulatory Visit (INDEPENDENT_AMBULATORY_CARE_PROVIDER_SITE_OTHER): Payer: No Typology Code available for payment source | Admitting: Endocrinology

## 2013-11-03 VITALS — BP 136/88 | HR 74 | Temp 97.9°F | Ht 65.5 in | Wt 139.0 lb

## 2013-11-03 DIAGNOSIS — E0590 Thyrotoxicosis, unspecified without thyrotoxic crisis or storm: Secondary | ICD-10-CM

## 2013-11-03 LAB — T4, FREE: Free T4: 1.01 ng/dL (ref 0.80–1.80)

## 2013-11-03 LAB — TSH: TSH: 0.592 u[IU]/mL (ref 0.350–4.500)

## 2013-11-03 NOTE — Progress Notes (Signed)
Subjective:    Patient ID: Anna Moore, female    DOB: [birth year], 58 y.o.   MRN: [id number]  HPI Pt returns for f/u of hyperthyroidism (dx'ed early 2015, on a routine blood test; tapazole was chosen as rx, as it is mild; she has never had thyroid imaging).  She takes tapazole as rx'ed.   She has slight "sour" sensation in the stomach, but no assoc heartburn.   Past Medical History  Diagnosis Date  . Cough     Questionably ACE related  . Fatigue     Significant fatigue in the setting of back pain and diaphoresis  . History of tobacco abuse   . Fatigue   . Palpitations   . Forgetfulness   . Hot flashes   . Hypertension   . Anxiety   . DJD (degenerative joint disease)     Of the back    Past Surgical History  Procedure Laterality Date  . Back surgery  01/2008    On L4 and L5  . Other surgical history  1998    Hysterectomy  . Parotidectomy  1990  . Nasal septum surgery    . Abdominal hysterectomy      History   Social History  . Marital Status: Married    Spouse Name: N/A    Number of Children: N/A  . Years of Education: N/A   Occupational History  . Not on file.   Social History Main Topics  . Smoking status: Current Some Day Smoker    Types: Cigarettes  . Smokeless tobacco: Never Used  . Alcohol Use: 3.6 oz/week    6 Cans of beer per week  . Drug Use: No  . Sexual Activity: Not on file   Other Topics Concern  . Not on file   Social History Narrative   Married.  One daughter, 26 yo- moving back in with her.   Retired Medical illustrator.    Current Outpatient Prescriptions on File Prior to Visit  Medication Sig Dispense Refill  . amLODipine (NORVASC) 10 MG tablet Take one tablet by mouth daily.  30 tablet  5  . aspirin 81 MG tablet Take 81 mg by mouth every other day.      . estradiol (ESTRACE) 1 MG tablet Take 1 tablet (1 mg total) by mouth 2 (two) times daily.  60 tablet  2  . hydrochlorothiazide (MICROZIDE) 12.5 MG capsule Take 1 capsule (12.5  mg total) by mouth daily.  30 capsule  2  . methimazole (TAPAZOLE) 5 MG tablet Take 1 tablet (5 mg total) by mouth daily.  30 tablet  2  . traZODone (DESYREL) 50 MG tablet Take 0.5-1 tablets (25-50 mg total) by mouth at bedtime as needed for sleep.  30 tablet  3   No current facility-administered medications on file prior to visit.    No Known Allergies  Family History  Problem Relation Age of Onset  . Arrhythmia Mother   . Hypertension Father   . Heart disease Father   . Heart attack Father     34's  . Cancer Father     non hodgkins lymphoma  . Colon cancer Neg Hx   . Thyroid disease Neg Hx     BP 136/88  Pulse 74  Temp(Src) 97.9 F (36.6 C) (Oral)  Ht 5' 5.5" (1.664 m)  Wt 139 lb (63.05 kg)  BMI 22.77 kg/m2  SpO2 98%   Review of Systems Denies fever, BRBPR, and dysphagia.  She has weight  gain.     Objective:   Physical Exam VITAL SIGNS:  See vs page GENERAL: no distress LUNGS:  Clear to auscultation ABDOMEN: abdomen is soft, nontender.  no hepatosplenomegaly. not distended.  no hernia.     Lab Results  Component Value Date   TSH 0.592 11/03/2013       Assessment & Plan:  Dyspeptic sxs, new, uncertain etiology Hyperthyroidism, well-controlled    Patient is advised the following: Patient Instructions  Try "omeprazole" 20 mg daily. Please see Dr Deborra Medina if this does not help. blood tests are being requested for you today.  We'll contact you with results. Please come back for a follow-up appointment in 2 months.

## 2013-11-03 NOTE — Patient Instructions (Signed)
Try "omeprazole" 20 mg daily. Please see Dr Deborra Medina if this does not help. blood tests are being requested for you today.  We'll contact you with results. Please come back for a follow-up appointment in 2 months.

## 2013-11-24 ENCOUNTER — Other Ambulatory Visit: Payer: Self-pay | Admitting: *Deleted

## 2013-11-24 ENCOUNTER — Other Ambulatory Visit: Payer: Self-pay | Admitting: Endocrinology

## 2013-11-24 MED ORDER — ESTRADIOL 1 MG PO TABS: 1.0000 mg | ORAL_TABLET | Freq: Two times a day (BID) | ORAL | Status: DC

## 2013-11-24 MED ORDER — HYDROCHLOROTHIAZIDE 12.5 MG PO CAPS: 12.5000 mg | ORAL_CAPSULE | Freq: Every day | ORAL | Status: DC

## 2013-12-20 ENCOUNTER — Other Ambulatory Visit: Payer: Self-pay | Admitting: Endocrinology

## 2014-01-05 ENCOUNTER — Ambulatory Visit: Payer: No Typology Code available for payment source | Admitting: Endocrinology

## 2014-01-07 ENCOUNTER — Ambulatory Visit (INDEPENDENT_AMBULATORY_CARE_PROVIDER_SITE_OTHER): Payer: No Typology Code available for payment source | Admitting: Endocrinology

## 2014-01-07 ENCOUNTER — Encounter: Payer: Self-pay | Admitting: Endocrinology

## 2014-01-07 VITALS — BP 132/70 | HR 71 | Temp 98.2°F | Ht 65.5 in | Wt 141.0 lb

## 2014-01-07 DIAGNOSIS — E0590 Thyrotoxicosis, unspecified without thyrotoxic crisis or storm: Secondary | ICD-10-CM

## 2014-01-07 LAB — TSH: TSH: 0.56 u[IU]/mL (ref 0.35–4.50)

## 2014-01-07 LAB — T4, FREE: Free T4: 1.13 ng/dL (ref 0.60–1.60)

## 2014-01-07 NOTE — Patient Instructions (Addendum)
blood tests are being requested for you today.  We'll contact you with results. Please come back for a follow-up appointment in 3-4 months.  if ever you have fever while taking methimazole, stop it and call us, because of the risk of a rare side-effect.

## 2014-01-07 NOTE — Progress Notes (Signed)
Subjective:    Patient ID: Anna Moore, female    DOB: [birth year], 58 y.o.   MRN: [id number]  HPI Pt returns for f/u of hyperthyroidism (dx'ed early 2015, on a routine blood test; tapazole was chosen as rx, as it is mild; she has never had thyroid imaging).  She takes tapazole as rx'ed.  She denies fever and tremor.  Past Medical History  Diagnosis Date  . Cough     Questionably ACE related  . Fatigue     Significant fatigue in the setting of back pain and diaphoresis  . History of tobacco abuse   . Fatigue   . Palpitations   . Forgetfulness   . Hot flashes   . Hypertension   . Anxiety   . DJD (degenerative joint disease)     Of the back    Past Surgical History  Procedure Laterality Date  . Back surgery  01/2008    On L4 and L5  . Other surgical history  1998    Hysterectomy  . Parotidectomy  1990  . Nasal septum surgery    . Abdominal hysterectomy      History   Social History  . Marital Status: Married    Spouse Name: N/A    Number of Children: N/A  . Years of Education: N/A   Occupational History  . Not on file.   Social History Main Topics  . Smoking status: Current Some Day Smoker    Types: Cigarettes  . Smokeless tobacco: Never Used  . Alcohol Use: 3.6 oz/week    6 Cans of beer per week  . Drug Use: No  . Sexual Activity: Not on file   Other Topics Concern  . Not on file   Social History Narrative   Married.  One daughter, 70 yo- moving back in with her.   Retired Medical illustrator.    Current Outpatient Prescriptions on File Prior to Visit  Medication Sig Dispense Refill  . amLODipine (NORVASC) 10 MG tablet Take one tablet by mouth daily. 30 tablet 5  . aspirin 81 MG tablet Take 81 mg by mouth every other day.    . estradiol (ESTRACE) 1 MG tablet Take 1 tablet (1 mg total) by mouth 2 (two) times daily. 60 tablet 2  . hydrochlorothiazide (MICROZIDE) 12.5 MG capsule Take 1 capsule (12.5 mg total) by mouth daily. 30 capsule 2  .  methimazole (TAPAZOLE) 5 MG tablet TAKE ONE TABLET BY MOUTH ONCE DAILY 30 tablet 0  . traZODone (DESYREL) 50 MG tablet Take 0.5-1 tablets (25-50 mg total) by mouth at bedtime as needed for sleep. 30 tablet 3   No current facility-administered medications on file prior to visit.    No Known Allergies  Family History  Problem Relation Age of Onset  . Arrhythmia Mother   . Hypertension Father   . Heart disease Father   . Heart attack Father     63's  . Cancer Father     non hodgkins lymphoma  . Colon cancer Neg Hx   . Thyroid disease Neg Hx     BP 132/70 mmHg  Pulse 71  Temp(Src) 98.2 F (36.8 C) (Oral)  Ht 5' 5.5" (1.664 m)  Wt 141 lb (63.957 kg)  BMI 23.10 kg/m2  SpO2 96%    Review of Systems Heartburn is resolved.     Objective:   Physical Exam VITAL SIGNS:  See vs page GENERAL: no distress NECK: There is no palpable thyroid enlargement.  No  thyroid nodule is palpable.  No palpable lymphadenopathy at the anterior neck.     Lab Results  Component Value Date   TSH 0.56 01/07/2014       Assessment & Plan:  Hyperthyroidism, well-controlled  Patient is advised the following: Patient Instructions  blood tests are being requested for you today.  We'll contact you with results. Please come back for a follow-up appointment in 3-4 months.  if ever you have fever while taking methimazole, stop it and call us, because of the risk of a rare side-effect.

## 2014-01-18 ENCOUNTER — Other Ambulatory Visit: Payer: Self-pay | Admitting: Endocrinology

## 2014-02-15 ENCOUNTER — Other Ambulatory Visit: Payer: Self-pay | Admitting: Endocrinology

## 2014-02-23 ENCOUNTER — Other Ambulatory Visit: Payer: Self-pay

## 2014-02-23 MED ORDER — HYDROCHLOROTHIAZIDE 12.5 MG PO CAPS: 12.5000 mg | ORAL_CAPSULE | Freq: Every day | ORAL | Status: DC

## 2014-02-23 MED ORDER — ESTRADIOL 1 MG PO TABS: 1.0000 mg | ORAL_TABLET | Freq: Two times a day (BID) | ORAL | Status: DC

## 2014-02-23 NOTE — Telephone Encounter (Signed)
Pt left v/m requesting refill estradiol and HCTZ to [facility]; pt said requested refills from pharmacy last week; pt going out of town on 02/24/14; pt request refill to be done today. Pt last seen 07/16/13 and no future appt scheduled.

## 2014-03-19 ENCOUNTER — Other Ambulatory Visit: Payer: Self-pay | Admitting: Endocrinology

## 2014-03-29 ENCOUNTER — Other Ambulatory Visit: Payer: Self-pay | Admitting: *Deleted

## 2014-03-29 MED ORDER — ESTRADIOL 1 MG PO TABS: 1.0000 mg | ORAL_TABLET | Freq: Two times a day (BID) | ORAL | Status: DC

## 2014-03-29 MED ORDER — HYDROCHLOROTHIAZIDE 12.5 MG PO CAPS: 12.5000 mg | ORAL_CAPSULE | Freq: Every day | ORAL | Status: DC

## 2014-04-16 ENCOUNTER — Other Ambulatory Visit: Payer: Self-pay | Admitting: Endocrinology

## 2014-05-11 ENCOUNTER — Encounter: Payer: Self-pay | Admitting: Endocrinology

## 2014-05-11 ENCOUNTER — Ambulatory Visit (INDEPENDENT_AMBULATORY_CARE_PROVIDER_SITE_OTHER): Payer: No Typology Code available for payment source | Admitting: Endocrinology

## 2014-05-11 VITALS — BP 166/87 | HR 87 | Temp 98.1°F | Wt 142.6 lb

## 2014-05-11 DIAGNOSIS — E0590 Thyrotoxicosis, unspecified without thyrotoxic crisis or storm: Secondary | ICD-10-CM

## 2014-05-11 LAB — T4, FREE: FREE T4: 1.07 ng/dL (ref 0.60–1.60)

## 2014-05-11 LAB — TSH: TSH: 0.98 u[IU]/mL (ref 0.35–4.50)

## 2014-05-11 NOTE — Patient Instructions (Addendum)
blood tests are being requested for you today.  We'll contact you with results.  Please come back for a follow-up appointment in 4-6 months.  if ever you have fever while taking methimazole, stop it and call us, because of the risk of a rare side-effect.

## 2014-05-11 NOTE — Progress Notes (Signed)
Subjective:    Patient ID: Anna Moore, female    DOB: 1955-06-10, 59 y.o.   MRN: [id number]  HPI Pt returns for f/u of hyperthyroidism (dx'ed early 2015, on a routine blood test; tapazole was chosen as rx, as it is mild; she has never had thyroid imaging).  She takes tapazole as rx'ed.  She denies tremor.   Past Medical History  Diagnosis Date  . Cough     Questionably ACE related  . Fatigue     Significant fatigue in the setting of back pain and diaphoresis  . History of tobacco abuse   . Fatigue   . Palpitations   . Forgetfulness   . Hot flashes   . Hypertension   . Anxiety   . DJD (degenerative joint disease)     Of the back    Past Surgical History  Procedure Laterality Date  . Back surgery  01/2008    On L4 and L5  . Other surgical history  1998    Hysterectomy  . Parotidectomy  1990  . Nasal septum surgery    . Abdominal hysterectomy      History   Social History  . Marital Status: Married    Spouse Name: N/A  . Number of Children: N/A  . Years of Education: N/A   Occupational History  . Not on file.   Social History Main Topics  . Smoking status: Current Some Day Smoker    Types: Cigarettes  . Smokeless tobacco: Never Used  . Alcohol Use: 3.6 oz/week    6 Cans of beer per week  . Drug Use: No  . Sexual Activity: Not on file   Other Topics Concern  . Not on file   Social History Narrative   Married.  One daughter, 69 yo- moving back in with her.   Retired Medical illustrator.    Current Outpatient Prescriptions on File Prior to Visit  Medication Sig Dispense Refill  . amLODipine (NORVASC) 10 MG tablet Take one tablet by mouth daily. 30 tablet 5  . aspirin 81 MG tablet Take 81 mg by mouth every other day.    . estradiol (ESTRACE) 1 MG tablet Take 1 tablet (1 mg total) by mouth 2 (two) times daily. 60 tablet 5  . hydrochlorothiazide (MICROZIDE) 12.5 MG capsule Take 1 capsule (12.5 mg total) by mouth daily. 30 capsule 5  . methimazole  (TAPAZOLE) 5 MG tablet TAKE ONE TABLET BY MOUTH ONCE DAILY. 30 tablet 0  . traZODone (DESYREL) 50 MG tablet Take 0.5-1 tablets (25-50 mg total) by mouth at bedtime as needed for sleep. 30 tablet 3   No current facility-administered medications on file prior to visit.    No Known Allergies  Family History  Problem Relation Age of Onset  . Arrhythmia Mother   . Hypertension Father   . Heart disease Father   . Heart attack Father     1's  . Cancer Father     non hodgkins lymphoma  . Colon cancer Neg Hx   . Thyroid disease Neg Hx     BP 166/87 mmHg  Pulse 87  Temp(Src) 98.1 F (36.7 C) (Oral)  Wt 142 lb 9.6 oz (64.683 kg)  Review of Systems Denies fever    Objective:   Physical Exam VITAL SIGNS:  See vs page GENERAL: no distress Skin: not diaphoretic Neuro: no tremor.     Lab Results  Component Value Date   TSH 0.98 05/11/2014  Assessment & Plan:  Hyperthyroidism, well-controlled  Patient is advised the following: Patient Instructions  blood tests are being requested for you today.  We'll contact you with results.  Please come back for a follow-up appointment in 4-6 months.  if ever you have fever while taking methimazole, stop it and call us, because of the risk of a rare side-effect.   addendum: please continue the same tapazole

## 2014-05-14 ENCOUNTER — Other Ambulatory Visit: Payer: Self-pay | Admitting: Endocrinology

## 2014-05-19 ENCOUNTER — Ambulatory Visit (INDEPENDENT_AMBULATORY_CARE_PROVIDER_SITE_OTHER): Payer: No Typology Code available for payment source | Admitting: Family Medicine

## 2014-05-19 ENCOUNTER — Encounter: Payer: Self-pay | Admitting: Family Medicine

## 2014-05-19 ENCOUNTER — Telehealth: Payer: Self-pay

## 2014-05-19 VITALS — BP 146/90 | HR 75 | Temp 97.8°F | Wt 144.0 lb

## 2014-05-19 DIAGNOSIS — R079 Chest pain, unspecified: Secondary | ICD-10-CM

## 2014-05-19 LAB — CBC WITH DIFFERENTIAL/PLATELET
BASOS PCT: 0.4 % (ref 0.0–3.0)
Basophils Absolute: 0 10*3/uL (ref 0.0–0.1)
EOS ABS: 0.1 10*3/uL (ref 0.0–0.7)
EOS PCT: 0.6 % (ref 0.0–5.0)
HCT: 44.6 % (ref 36.0–46.0)
Hemoglobin: 15.2 g/dL — ABNORMAL HIGH (ref 12.0–15.0)
Lymphocytes Relative: 23.2 % (ref 12.0–46.0)
Lymphs Abs: 2.3 10*3/uL (ref 0.7–4.0)
MCHC: 34 g/dL (ref 30.0–36.0)
MCV: 91.8 fl (ref 78.0–100.0)
Monocytes Absolute: 0.7 10*3/uL (ref 0.1–1.0)
Monocytes Relative: 7.4 % (ref 3.0–12.0)
NEUTROS PCT: 68.4 % (ref 43.0–77.0)
Neutro Abs: 6.7 10*3/uL (ref 1.4–7.7)
Platelets: 337 10*3/uL (ref 150.0–400.0)
RBC: 4.86 Mil/uL (ref 3.87–5.11)
RDW: 14 % (ref 11.5–15.5)
WBC: 9.8 10*3/uL (ref 4.0–10.5)

## 2014-05-19 LAB — BASIC METABOLIC PANEL
BUN: 14 mg/dL (ref 6–23)
CO2: 31 mEq/L (ref 19–32)
Calcium: 10.3 mg/dL (ref 8.4–10.5)
Chloride: 100 mEq/L (ref 96–112)
Creatinine, Ser: 0.63 mg/dL (ref 0.40–1.20)
GFR: 102.73 mL/min (ref >60.00)
Glucose, Bld: 90 mg/dL (ref 70–99)
POTASSIUM: 3.8 meq/L (ref 3.5–5.1)
Sodium: 137 mEq/L (ref 135–145)

## 2014-05-19 LAB — TROPONIN I: TNIDX: 0.02 ug/L (ref 0.00–0.06)

## 2014-05-19 NOTE — Telephone Encounter (Signed)
-----   Message from Owens Loffler, MD sent at 05/19/2014  5:43 PM EDT ----- All labs look stable and ok

## 2014-05-19 NOTE — Progress Notes (Signed)
Pre visit review using our clinic review tool, if applicable. No additional management support is needed unless otherwise documented below in the visit note. 

## 2014-05-19 NOTE — Progress Notes (Signed)
Dr. Frederico Hamman T. Bensen Chadderdon, MD, [facility] [place], [address] Phone: [phone] Fax: [phone]  05/19/2014  Patient: Britiny Defrain, MRN: [id number], DOB: 1955-04-27, 59 y.o.  Primary Physician:  Arnette Norris, MD  Chief Complaint: Back Pain  Subjective:   Loralai Eisman is a 59 y.o. very pleasant female patient who presents with the following:  Hurting really bad in her upper back. Having some radiating pain down shoulder, radiating around neck.  No real history of thoracic back pain. Ibuprofen did not help.   Had to stop on Monday, hurting bad in her back. Hard to get a deep breath.   L4-5 fusion in 2009 by Dr. Rolena Infante.   Cardiac risk factors.  Smoking 35 years, 2 cigs a week. HTN Hyperlipidemia Father, CABG x 5 - 22 - 60, s/p aortic valve replacement Mom, A. Fib No brothers or sisters with CAD  Past Medical History, Surgical History, Social History, Family History, Problem List, Medications, and Allergies have been reviewed and updated if relevant.  GEN: No fevers, chills. Nontoxic. Primarily MSK c/o today. MSK: Detailed in the HPI GI: tolerating PO intake without difficulty Neuro: No numbness, parasthesias, or tingling associated. Otherwise the pertinent positives of the ROS are noted above.   Objective:   BP 146/90 mmHg  Pulse 75  Temp(Src) 97.8 F (36.6 C) (Oral)  Wt 144 lb (65.318 kg)  SpO2 98%  GEN: WDWN, NAD, Non-toxic, A & O x 3 HEENT: Atraumatic, Normocephalic. Neck supple. No masses, No LAD. Ears and Nose: No external deformity. CV: RRR, No M/G/R. No JVD. No thrill. No extra heart sounds. Some chest wall pain PULM: CTA B, no wheezes, crackles, rhonchi. No retractions. No resp. distress. No accessory muscle use. EXTR: No c/c/e NEURO Normal gait.  PSYCH: Normally interactive. Conversant. Not depressed or anxious appearing.  Calm demeanor.    Radiology: Results for orders placed or  performed in visit on 70/62/37  Basic metabolic panel  Result Value Ref Range   Sodium 137 135 - 145 mEq/L   Potassium 3.8 3.5 - 5.1 mEq/L   Chloride 100 96 - 112 mEq/L   CO2 31 19 - 32 mEq/L   Glucose, Bld 90 70 - 99 mg/dL   BUN 14 6 - 23 mg/dL   Creatinine, Ser 0.63 0.40 - 1.20 mg/dL   Calcium 10.3 8.4 - 10.5 mg/dL   GFR 102.73 >60.00 mL/min  CBC with Differential/Platelet  Result Value Ref Range   WBC 9.8 4.0 - 10.5 K/uL   RBC 4.86 3.87 - 5.11 Mil/uL   Hemoglobin 15.2 (H) 12.0 - 15.0 g/dL   HCT 44.6 36.0 - 46.0 %   MCV 91.8 78.0 - 100.0 fl   MCHC 34.0 30.0 - 36.0 g/dL   RDW 14.0 11.5 - 15.5 %   Platelets 337.0 150.0 - 400.0 K/uL   Neutrophils Relative % 68.4 43.0 - 77.0 %   Lymphocytes Relative 23.2 12.0 - 46.0 %   Monocytes Relative 7.4 3.0 - 12.0 %   Eosinophils Relative 0.6 0.0 - 5.0 %   Basophils Relative 0.4 0.0 - 3.0 %   Neutro Abs 6.7 1.4 - 7.7 K/uL   Lymphs Abs 2.3 0.7 - 4.0 K/uL   Monocytes Absolute 0.7 0.1 - 1.0 K/uL   Eosinophils Absolute 0.1 0.0 - 0.7 K/uL   Basophils Absolute 0.0 0.0 - 0.1 K/uL  Troponin I  Result Value Ref Range   TNIDX 0.02 0.00 - 0.06  ug/l     Assessment and Plan:   Chest pain, unspecified chest pain type - Plan: EKG 36-RWER, Basic metabolic panel, CBC with Differential/Platelet, Troponin I, Ambulatory referral to Cardiology, CANCELED: Troponin I  Atypical chest pain in a woman with current chest pain. Troponin and EKG are normal. Multiple risk factors. I think cardiology eval is most appropriate in this case.   May be atypical thoracic radicular pain, as well.   EKG: Normal sinus rhythm. Normal axis, normal R wave progression, No acute ST elevation or depression.   Follow-up: No Follow-up on file.  New Prescriptions   No medications on file   Orders Placed This Encounter  Procedures  . Basic metabolic panel  . CBC with Differential/Platelet  . Troponin I  . Ambulatory referral to Cardiology  . EKG 12-Lead     Signed,  Lonni Dirden T. Lawanda Holzheimer, MD   Patient's Medications  New Prescriptions   No medications on file  Previous Medications   AMLODIPINE (NORVASC) 10 MG TABLET    Take one tablet by mouth daily.   ASPIRIN 81 MG TABLET    Take 81 mg by mouth every other day.   ESTRADIOL (ESTRACE) 1 MG TABLET    Take 1 tablet (1 mg total) by mouth 2 (two) times daily.   HYDROCHLOROTHIAZIDE (MICROZIDE) 12.5 MG CAPSULE    Take 1 capsule (12.5 mg total) by mouth daily.   METHIMAZOLE (TAPAZOLE) 5 MG TABLET    TAKE ONE TABLET BY MOUTH ONCE DAILY   METHOCARBAMOL (ROBAXIN) 500 MG TABLET    Take 500 mg by mouth as needed.   Modified Medications   No medications on file  Discontinued Medications   TRAZODONE (DESYREL) 50 MG TABLET    Take 0.5-1 tablets (25-50 mg total) by mouth at bedtime as needed for sleep.

## 2014-05-19 NOTE — Patient Instructions (Signed)

## 2014-05-19 NOTE — Telephone Encounter (Signed)
Patient informed of lab results. 

## 2014-05-20 ENCOUNTER — Encounter: Payer: Self-pay | Admitting: Cardiovascular Disease

## 2014-05-20 ENCOUNTER — Telehealth: Payer: Self-pay | Admitting: Family Medicine

## 2014-05-20 ENCOUNTER — Ambulatory Visit (INDEPENDENT_AMBULATORY_CARE_PROVIDER_SITE_OTHER): Payer: No Typology Code available for payment source | Admitting: Cardiovascular Disease

## 2014-05-20 VITALS — BP 138/86 | HR 81 | Ht 66.0 in | Wt 141.5 lb

## 2014-05-20 DIAGNOSIS — R5382 Chronic fatigue, unspecified: Secondary | ICD-10-CM

## 2014-05-20 DIAGNOSIS — E785 Hyperlipidemia, unspecified: Secondary | ICD-10-CM

## 2014-05-20 DIAGNOSIS — M479 Spondylosis, unspecified: Secondary | ICD-10-CM

## 2014-05-20 DIAGNOSIS — I10 Essential (primary) hypertension: Secondary | ICD-10-CM

## 2014-05-20 DIAGNOSIS — M549 Dorsalgia, unspecified: Secondary | ICD-10-CM

## 2014-05-20 DIAGNOSIS — R079 Chest pain, unspecified: Secondary | ICD-10-CM

## 2014-05-20 DIAGNOSIS — Z720 Tobacco use: Secondary | ICD-10-CM

## 2014-05-20 NOTE — Assessment & Plan Note (Signed)
Blood pressure is well controlled on today's visit. No changes made to the medications. 

## 2014-05-20 NOTE — Telephone Encounter (Signed)
emmi emailed °

## 2014-05-20 NOTE — Progress Notes (Signed)
Patient ID: Anna Moore, female    DOB: [birth year], 59 y.o.   MRN: [id number]  HPI Comments: Anna Moore is a very pleasant 59 year old woman with prior back pain, back surgery in the lumbar area, cervical back issues followed by orthopedics in [place], hypertension, hyperlipidemia, previous smoking history who presents for evaluation of back pain, neck pain, shortness of breath.  She reports that approximately 5 days ago on Sunday, she developed thoracic back pain. She denies any trauma or heavy lifting over the weekend. It hurts to move her arms outstretched, hurts to twist her upper body. She has to lay a certain way in bed. There is pain on palpation in her thoracic area. She is concerned as she did have some discomfort radiating into her left neck, also some shortness of breath. 4 days ago on Monday she was for laying out  fertilizer and week killer on her lawn. She had fatigue with exertion and she started to worry. In general she tries to stay active through the winter. Does regular activities, not very sedentary. Denies having cardiac issues in the past but does report that her father had bypass surgery. He did not smoke for a much but he was a diabetic.  She's never had chest pain symptoms in the past. Never had back pain like this in the past and she is concerned it is different. She does report having routine treadmill study in the past several years ago.  EKG on today's visit shows normal sinus rhythm with rate 81 bpm, no significant ST or T-wave changes   No Known Allergies  Outpatient Encounter Prescriptions as of 05/20/2014  Medication Sig  . amLODipine (NORVASC) 10 MG tablet Take one tablet by mouth daily.  Marland Kitchen aspirin 81 MG tablet Take 81 mg by mouth every other day.  . estradiol (ESTRACE) 1 MG tablet Take 1 tablet (1 mg total) by mouth 2 (two) times daily.  . hydrochlorothiazide (MICROZIDE) 12.5 MG capsule Take 1 capsule (12.5 mg total) by mouth daily.  . methimazole  (TAPAZOLE) 5 MG tablet TAKE ONE TABLET BY MOUTH ONCE DAILY  . methocarbamol (ROBAXIN) 500 MG tablet Take 500 mg by mouth as needed.   . [DISCONTINUED] traZODone (DESYREL) 50 MG tablet Take 0.5-1 tablets (25-50 mg total) by mouth at bedtime as needed for sleep. (Patient not taking: Reported on 05/20/2014)    Past Medical History  Diagnosis Date  . Cough     Questionably ACE related  . Fatigue     Significant fatigue in the setting of back pain and diaphoresis  . History of tobacco abuse   . Fatigue   . Palpitations   . Forgetfulness   . Hot flashes   . Hypertension   . Anxiety   . DJD (degenerative joint disease)     Of the back    Past Surgical History  Procedure Laterality Date  . Back surgery  01/2008    On L4 and L5  . Other surgical history  1998    Hysterectomy  . Parotidectomy  1990  . Nasal septum surgery    . Abdominal hysterectomy      Social History  reports that she has been smoking Cigarettes.  She has never used smokeless tobacco. She reports that she drinks about 3.6 oz of alcohol per week. She reports that she does not use illicit drugs.  Family History family history includes Arrhythmia in her mother; Cancer in her father; Heart attack in her father; Heart disease in her  father; Hypertension in her father. There is no history of Colon cancer or Thyroid disease.   \   Review of Systems  Constitutional: Negative.   HENT: Negative.   Eyes: Negative.   Respiratory: Negative.   Cardiovascular: Negative.   Gastrointestinal: Negative.   Endocrine: Negative.   Musculoskeletal: Negative.   Skin: Negative.   Allergic/Immunologic: Negative.   Neurological: Negative.   Hematological: Negative.   Psychiatric/Behavioral: Negative.   All other systems reviewed and are negative.   BP 138/86 mmHg  Pulse 81  Ht 5\' 6"  (1.676 m)  Wt 141 lb 8 oz (64.184 kg)  BMI 22.85 kg/m2  Physical Exam  Constitutional: She is oriented to person, place, and time. She  appears well-developed and well-nourished.  HENT:  Head: Normocephalic.  Nose: Nose normal.  Mouth/Throat: Oropharynx is clear and moist.  Eyes: Conjunctivae are normal. Pupils are equal, round, and reactive to light.  Neck: Normal range of motion. Neck supple. No JVD present.  Cardiovascular: Normal rate, regular rhythm, S1 normal, S2 normal, normal heart sounds and intact distal pulses.  Exam reveals no gallop and no friction rub.   No murmur heard. Pulmonary/Chest: Effort normal and breath sounds normal. No respiratory distress. She has no wheezes. She has no rales. She exhibits no tenderness.  Abdominal: Soft. Bowel sounds are normal. She exhibits no distension. There is no tenderness.  Musculoskeletal: Normal range of motion. She exhibits no edema or tenderness.  Mild discomfort to palpation, also some discomfort when moving her arms and torso with pain in her thoracic vertebral area midline  Lymphadenopathy:    She has no cervical adenopathy.  Neurological: She is alert and oriented to person, place, and time. Coordination normal.  Skin: Skin is warm and dry. No rash noted. No erythema.  Psychiatric: She has a normal mood and affect. Her behavior is normal. Judgment and thought content normal.    Assessment and Plan  Nursing note and vitals reviewed.

## 2014-05-20 NOTE — Assessment & Plan Note (Signed)
With symptoms of chest pain neck pain back pain are likely coming from her back, most likely muscular ligamental, unable to exclude other disease such as disc injury. She does have a orthopedic doctor that she will follow-up with. Given her family history of coronary disease, we have talked about various treatment options for her including stress testing with treadmill, alternatively a coronary calcium score. We will order the latter for risk stratification. Her cholesterol is elevated and if her calcium score is high, would need to discuss more aggressive cholesterol management with her. If  coronary calcium score is low, no further cardiac testing would be needed as symptoms most likely musculoskeletal

## 2014-05-20 NOTE — Assessment & Plan Note (Signed)
Recent cholesterol in 2015 of 240. Strong family history of coronary artery disease. Coronary calcium score ordered for risk stratification

## 2014-05-20 NOTE — Assessment & Plan Note (Signed)
We have encouraged her to continue to work on weaning her cigarettes and smoking cessation. She will continue to work on this and does not want any assistance with chantix.  

## 2014-05-20 NOTE — Patient Instructions (Addendum)
You are doing well. No medication changes were made.  We will schedule a coronary calcium score for shortness of breath, back pain, chest pain, neck pain 3/24 at 0945 am   Please call us if you have new issues that need to be addressed before your next appt.

## 2014-05-20 NOTE — Assessment & Plan Note (Signed)
Recent fatigue with exertion out in her yard. Very atypical in nature, likely from poor sleep, secondary to her back pain. Unable to exclude some deconditioning after the winter

## 2014-05-20 NOTE — Assessment & Plan Note (Signed)
I suspect her heart back pain is a new issue and have recommended that she see her orthopedic physician. Recommended ice, muscle relaxants, NSAIDs

## 2014-05-21 ENCOUNTER — Telehealth: Payer: Self-pay | Admitting: Family Medicine

## 2014-05-21 NOTE — Telephone Encounter (Signed)
emmi emailed °

## 2014-05-25 ENCOUNTER — Encounter: Payer: Self-pay | Admitting: Family Medicine

## 2014-05-27 ENCOUNTER — Ambulatory Visit (INDEPENDENT_AMBULATORY_CARE_PROVIDER_SITE_OTHER)
Admission: RE | Admit: 2014-05-27 | Discharge: 2014-05-27 | Disposition: A | Discharge status: Home / Self Care | Payer: No Typology Code available for payment source | Source: Ambulatory Visit | Attending: Cardiovascular Disease | Admitting: Cardiovascular Disease

## 2014-05-27 DIAGNOSIS — M549 Dorsalgia, unspecified: Secondary | ICD-10-CM

## 2014-05-27 DIAGNOSIS — R079 Chest pain, unspecified: Secondary | ICD-10-CM

## 2014-06-04 ENCOUNTER — Telehealth: Payer: Self-pay | Admitting: *Deleted

## 2014-06-04 NOTE — Telephone Encounter (Signed)
Pt is calling asking about the calcium score that she did last week and has not heard anything from [place] She kept stating that someone in the office told her she would hear from [place] by Tuesday  Stated to her that doctor is in clinic and was not sure if we were able to read the results or if we have them, I was not able to see it She said that since she paid out of pocket  She needs to know soon.  Please advise.

## 2014-06-04 NOTE — Telephone Encounter (Signed)
Please see result note 

## 2014-06-15 ENCOUNTER — Other Ambulatory Visit: Payer: Self-pay | Admitting: *Deleted

## 2014-06-15 MED ORDER — AMLODIPINE BESYLATE 10 MG PO TABS: ORAL_TABLET | ORAL | Status: DC

## 2014-06-15 NOTE — Telephone Encounter (Signed)
Received refill request electronically from pharmacy. Refill sent to pharmacy electronically.

## 2014-07-20 ENCOUNTER — Encounter: Payer: No Typology Code available for payment source | Admitting: Family Medicine

## 2014-07-28 ENCOUNTER — Ambulatory Visit (INDEPENDENT_AMBULATORY_CARE_PROVIDER_SITE_OTHER): Payer: No Typology Code available for payment source | Admitting: Family Medicine

## 2014-07-28 ENCOUNTER — Encounter: Payer: Self-pay | Admitting: Family Medicine

## 2014-07-28 VITALS — BP 136/82 | HR 78 | Temp 98.2°F | Ht 65.25 in | Wt 139.2 lb

## 2014-07-28 DIAGNOSIS — Z0000 Encounter for general adult medical examination without abnormal findings: Secondary | ICD-10-CM | POA: Diagnosis not present

## 2014-07-28 DIAGNOSIS — E0590 Thyrotoxicosis, unspecified without thyrotoxic crisis or storm: Secondary | ICD-10-CM

## 2014-07-28 DIAGNOSIS — G4700 Insomnia, unspecified: Secondary | ICD-10-CM | POA: Diagnosis not present

## 2014-07-28 DIAGNOSIS — Z79890 Hormone replacement therapy (postmenopausal): Secondary | ICD-10-CM | POA: Insufficient documentation

## 2014-07-28 DIAGNOSIS — I10 Essential (primary) hypertension: Secondary | ICD-10-CM

## 2014-07-28 DIAGNOSIS — E785 Hyperlipidemia, unspecified: Secondary | ICD-10-CM | POA: Diagnosis not present

## 2014-07-28 DIAGNOSIS — F419 Anxiety disorder, unspecified: Secondary | ICD-10-CM

## 2014-07-28 LAB — LIPID PANEL
CHOLESTEROL: 238 mg/dL — AB (ref 0–200)
HDL: 81.1 mg/dL (ref >39.00)
LDL CALC: 140 mg/dL — AB (ref 0–99)
NonHDL: 156.9
Total CHOL/HDL Ratio: 3
Triglycerides: 83 mg/dL (ref 0.0–149.0)
VLDL: 16.6 mg/dL (ref 0.0–40.0)

## 2014-07-28 LAB — VITAMIN D 25 HYDROXY (VIT D DEFICIENCY, FRACTURES): VITD: 40.72 ng/mL (ref 30.00–100.00)

## 2014-07-28 LAB — VITAMIN B12: VITAMIN B 12: 315 pg/mL (ref 211–911)

## 2014-07-28 NOTE — Patient Instructions (Signed)
Good to see you. We will call you with your lab results.   

## 2014-07-28 NOTE — Assessment & Plan Note (Signed)
Reviewed preventive care protocols, scheduled due services, and updated immunizations Discussed nutrition, exercise, diet, and healthy lifestyle.  BME done today.  Orders Placed This Encounter  Procedures  . Lipid panel  . Vitamin B12  . Vitamin D, 25-hydroxy

## 2014-07-28 NOTE — Addendum Note (Signed)
Addended by: Lucille Passy on: 07/28/2014 09:48 AM   Modules accepted: Miquel Dunn

## 2014-07-28 NOTE — Assessment & Plan Note (Signed)
Well controlled. No changes made today. 

## 2014-07-28 NOTE — Progress Notes (Signed)
Subjective:    Patient ID: Anna Moore, female    DOB: Apr 03, 1955, 60 y.o.   MRN: [id number]  HPI  Very pleasant 59 yo female here for CPX   Colonoscopy 06/02/12 Ardis Hughs)- 3 year recall. Mammogram 05/24/14 Pap smear 06/17/09 ([facility] [facility]- scanned in [place]). S/p hysterectomy.   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  Mom is currently at SNF due to recent fall.  Does get tearful but does not feel depressed.  Husband is very supportive.    Chest pain-recurrent episodes in 05/2014.  Neg CT cardiac scoring in 05/2014.  No further episodes.   +FH of CAD- dad had CABG in his 61s. Lab Results  Component Value Date   CHOL 243* 07/16/2013   HDL 80.00 07/16/2013   LDLCALC 140* 07/16/2013   LDLDIRECT 127.1 04/30/2012   TRIG 113.0 07/16/2013   CHOLHDL 3 07/16/2013   Hyperthyroidism- well controlled on Methimazole. Lab Results  Component Value Date   TSH 0.98 05/11/2014    HTN- on HCTZ 12.5 mg and norvasc 10 mg daily.  No HA, blurred vision, CP or SOB.  Lab Results  Component Value Date   CREATININE 0.63 05/19/2014    Lab Results  Component Value Date   TSH 0.98 05/11/2014   Lab Results  Component Value Date   WBC 9.8 05/19/2014   HGB 15.2* 05/19/2014   HCT 44.6 05/19/2014   MCV 91.8 05/19/2014   PLT 337.0 05/19/2014   Lab Results  Component Value Date   NA 137 05/19/2014   K 3.8 05/19/2014   CL 100 05/19/2014   CO2 31 05/19/2014    HRT- s/p hysterectomy but does still have ovaries.  On estrace- attempted to wean off but hot flashes and insomnia were unbearable.  Tobacco abuse- She does still smoke 1-2 cigarettes per week- "I'm a stress smoker.".   Patient Active Problem List   Diagnosis Date Noted  . Hyperlipidemia 05/20/2014  . Hyperthyroidism 09/22/2013  . Routine general medical examination at a health care facility 07/16/2013  . Chest pain 07/16/2013  . Fatigue 07/16/2013  . Insomnia 07/16/2013  . Post  menopausal problems 04/30/2012  . Tobacco abuse 04/30/2012  . Eczema, dyshidrotic 04/30/2012  . Hypertension   . Anxiety   . DJD (degenerative joint disease)   . Palpitations    Past Medical History  Diagnosis Date  . Cough     Questionably ACE related  . Fatigue     Significant fatigue in the setting of back pain and diaphoresis  . History of tobacco abuse   . Fatigue   . Palpitations   . Forgetfulness   . Hot flashes   . Hypertension   . Anxiety   . DJD (degenerative joint disease)     Of the back   Past Surgical History  Procedure Laterality Date  . Back surgery  01/2008    On L4 and L5  . Other surgical history  1998    Hysterectomy  . Parotidectomy  1990  . Nasal septum surgery    . Abdominal hysterectomy     History  Substance Use Topics  . Smoking status: Current Some Day Smoker    Types: Cigarettes  . Smokeless tobacco: Never Used     Comment: while drinking  . Alcohol Use: 3.6 oz/week    6 Cans of beer per week     Comment: occasional   Family History  Problem Relation Age of Onset  .  Arrhythmia Mother   . Hypertension Father   . Heart disease Father   . Heart attack Father     12's  . Cancer Father     non hodgkins lymphoma  . Colon cancer Neg Hx   . Thyroid disease Neg Hx    No Known Allergies Current Outpatient Prescriptions on File Prior to Visit  Medication Sig Dispense Refill  . amLODipine (NORVASC) 10 MG tablet Take one tablet by mouth daily. 30 tablet 5  . aspirin 81 MG tablet Take 81 mg by mouth every other day.    . estradiol (ESTRACE) 1 MG tablet Take 1 tablet (1 mg total) by mouth 2 (two) times daily. 60 tablet 5  . hydrochlorothiazide (MICROZIDE) 12.5 MG capsule Take 1 capsule (12.5 mg total) by mouth daily. 30 capsule 5  . methimazole (TAPAZOLE) 5 MG tablet TAKE ONE TABLET BY MOUTH ONCE DAILY 30 tablet 2  . methocarbamol (ROBAXIN) 500 MG tablet Take 500 mg by mouth as needed.      No current facility-administered medications on  file prior to visit.   The PMH, PSH, Social History, Family History, Medications, and allergies have been reviewed in [facility], and have been updated if relevant.   Review of Systems  Constitutional: Positive for fatigue. Negative for unexpected weight change.  HENT: Negative.   Eyes: Negative.  Negative for discharge.  Respiratory: Negative.   Endocrine: Negative.   Genitourinary: Negative.   Musculoskeletal: Negative.   Skin: Negative.   Allergic/Immunologic: Negative.   Neurological: Negative.   Hematological: Negative.   Psychiatric/Behavioral: Negative.        Objective:   Physical Exam BP 136/82 mmHg  Pulse 78  Temp(Src) 98.2 F (36.8 C) (Oral)  Ht 5' 5.25" (1.657 m)  Wt 139 lb 4 oz (63.163 kg)  BMI 23.00 kg/m2  SpO2 96%   General:  Well-developed,well-nourished,in no acute distress; alert,appropriate and cooperative throughout examination Head:  normocephalic and atraumatic.   Eyes:  vision grossly intact, pupils equal, pupils round, and pupils reactive to light.   Ears:  R ear normal and L ear normal.   Nose:  no external deformity.   Mouth:  good dentition.   Neck:  No deformities, masses, or tenderness noted. Breasts:  No mass, nodules, thickening, tenderness, bulging, retraction, inflamation, nipple discharge or skin changes noted.   Lungs:  Normal respiratory effort, chest expands symmetrically. Lungs are clear to auscultation, no crackles or wheezes. Heart:  Normal rate and regular rhythm. S1 and S2 normal without gallop, murmur, click, rub or other extra sounds. Abdomen:  Bowel sounds positive,abdomen soft and non-tender without masses, organomegaly or hernias noted. Rectal:  no external abnormalities.   Genitalia:  Pelvic Exam:        External: normal female genitalia without lesions or masses        Vagina: normal without lesions or masses        Cervix: absent        Adnexa: normal bimanual exam without masses or fullness        Uterus: absent    Msk:   No deformity or scoliosis noted of thoracic or lumbar spine.   Extremities:  No clubbing, cyanosis, edema, or deformity noted with normal full range of motion of all joints.   Neurologic:  alert & oriented X3 and gait normal.   Skin:  Intact without suspicious lesions or rashes Cervical Nodes:  No lymphadenopathy noted Axillary Nodes:  No palpable lymphadenopathy Psych:  Cognition and judgment appear  intact. Alert and cooperative with normal attention span and concentration. No apparent delusions, illusions, hallucinations      Assessment & Plan:

## 2014-07-28 NOTE — Assessment & Plan Note (Signed)
Discussing attempting a wean again- start with 1 tablet night, then 1 tablet every other night, etc. She will keep me updated.

## 2014-07-28 NOTE — Progress Notes (Signed)
Pre visit review using our clinic review tool, if applicable. No additional management support is needed unless otherwise documented below in the visit note. 

## 2014-07-28 NOTE — Assessment & Plan Note (Signed)
Adjusting to current stressors well. She will keep me updated- offered my support.

## 2014-07-29 ENCOUNTER — Encounter: Payer: Self-pay | Admitting: *Deleted

## 2014-08-16 ENCOUNTER — Other Ambulatory Visit: Payer: Self-pay | Admitting: Endocrinology

## 2014-09-15 ENCOUNTER — Other Ambulatory Visit: Payer: Self-pay | Admitting: Endocrinology

## 2014-09-16 ENCOUNTER — Other Ambulatory Visit: Payer: Self-pay | Admitting: *Deleted

## 2014-09-16 MED ORDER — HYDROCHLOROTHIAZIDE 12.5 MG PO CAPS: 12.5000 mg | ORAL_CAPSULE | Freq: Every day | ORAL | Status: DC

## 2014-09-16 MED ORDER — ESTRADIOL 1 MG PO TABS: 1.0000 mg | ORAL_TABLET | Freq: Two times a day (BID) | ORAL | Status: DC

## 2014-11-11 ENCOUNTER — Encounter: Payer: Self-pay | Admitting: Endocrinology

## 2014-11-11 ENCOUNTER — Ambulatory Visit (INDEPENDENT_AMBULATORY_CARE_PROVIDER_SITE_OTHER): Payer: No Typology Code available for payment source | Admitting: Endocrinology

## 2014-11-11 VITALS — BP 137/84 | HR 73 | Temp 98.0°F | Ht 65.25 in | Wt 140.0 lb

## 2014-11-11 DIAGNOSIS — E0590 Thyrotoxicosis, unspecified without thyrotoxic crisis or storm: Secondary | ICD-10-CM | POA: Diagnosis not present

## 2014-11-11 NOTE — Progress Notes (Signed)
Subjective:    Patient ID: Anna Moore, female    DOB: 03-Jul-1955, 59 y.o.   MRN: [id number]  HPI Pt returns for f/u of hyperthyroidism (dx'ed early 2015, on a routine blood test; tapazole was chosen as rx, as it is mild; she has never had thyroid imaging).  She takes tapazole as rx'ed.  She denies tremor.   Past Medical History  Diagnosis Date  . Cough     Questionably ACE related  . Fatigue     Significant fatigue in the setting of back pain and diaphoresis  . History of tobacco abuse   . Fatigue   . Palpitations   . Forgetfulness   . Hot flashes   . Hypertension   . Anxiety   . DJD (degenerative joint disease)     Of the back    Past Surgical History  Procedure Laterality Date  . Back surgery  01/2008    On L4 and L5  . Other surgical history  1998    Hysterectomy  . Parotidectomy  1990  . Nasal septum surgery    . Abdominal hysterectomy      Social History   Social History  . Marital Status: Married    Spouse Name: N/A  . Number of Children: N/A  . Years of Education: N/A   Occupational History  . Not on file.   Social History Main Topics  . Smoking status: Current Some Day Smoker    Types: Cigarettes  . Smokeless tobacco: Never Used     Comment: while drinking  . Alcohol Use: 3.6 oz/week    6 Cans of beer per week     Comment: occasional  . Drug Use: No  . Sexual Activity: Not on file   Other Topics Concern  . Not on file   Social History Narrative   Married.  One daughter, 61 yo- moving back in with her.   Retired Medical illustrator.    Current Outpatient Prescriptions on File Prior to Visit  Medication Sig Dispense Refill  . amLODipine (NORVASC) 10 MG tablet Take one tablet by mouth daily. 30 tablet 5  . aspirin 81 MG tablet Take 81 mg by mouth every other day.    . estradiol (ESTRACE) 1 MG tablet Take 1 tablet (1 mg total) by mouth 2 (two) times daily. 60 tablet 5  . hydrochlorothiazide (MICROZIDE) 12.5 MG capsule Take 1 capsule (12.5  mg total) by mouth daily. 30 capsule 5  . methimazole (TAPAZOLE) 5 MG tablet TAKE ONE TABLET BY MOUTH ONCE DAILY 30 tablet 5  . methocarbamol (ROBAXIN) 500 MG tablet Take 500 mg by mouth as needed.      No current facility-administered medications on file prior to visit.    No Known Allergies  Family History  Problem Relation Age of Onset  . Arrhythmia Mother   . Hypertension Father   . Heart disease Father   . Heart attack Father     69's  . Cancer Father     non hodgkins lymphoma  . Colon cancer Neg Hx   . Thyroid disease Neg Hx     BP 137/84 mmHg  Pulse 73  Temp(Src) 98 F (36.7 C) (Oral)  Ht 5' 5.25" (1.657 m)  Wt 140 lb (63.504 kg)  BMI 23.13 kg/m2  SpO2 98%  Review of Systems Denies fever.      Objective:   Physical Exam VITAL SIGNS:  See vs page GENERAL: no distress NECK: Thyroid is slightly and diffusely  enlarged.  No thyroid nodule is palpable.   Nodes: No palpable lymphadenopathy at the anterior neck. SKIN: not diaphoretic NEURO: no tremor      Assessment & Plan:  Hyperthyroidism: due for recheck.   Patient is advised the following: Patient Instructions  Thyroid blood tests are being requested for you today.  We'll contact you with results.  Please come back for a follow-up appointment in 6 months.   if ever you have fever while taking methimazole, stop it and call us, because of the risk of a rare side-effect.

## 2014-11-11 NOTE — Patient Instructions (Signed)
Thyroid blood tests are being requested for you today.  We'll contact you with results.  Please come back for a follow-up appointment in 6 months.   if ever you have fever while taking methimazole, stop it and call us, because of the risk of a rare side-effect.

## 2014-11-16 ENCOUNTER — Other Ambulatory Visit: Payer: No Typology Code available for payment source

## 2014-12-03 ENCOUNTER — Other Ambulatory Visit (INDEPENDENT_AMBULATORY_CARE_PROVIDER_SITE_OTHER): Payer: No Typology Code available for payment source

## 2014-12-03 DIAGNOSIS — E0590 Thyrotoxicosis, unspecified without thyrotoxic crisis or storm: Secondary | ICD-10-CM

## 2014-12-03 LAB — TSH: TSH: 0.47 u[IU]/mL (ref 0.35–4.50)

## 2014-12-17 ENCOUNTER — Other Ambulatory Visit: Payer: Self-pay | Admitting: Family Medicine

## 2015-02-04 ENCOUNTER — Telehealth: Payer: Self-pay | Admitting: *Deleted

## 2015-02-04 NOTE — Telephone Encounter (Signed)
Lm on pts vm requesting a call back if wanting to schedule a flu shot 

## 2015-03-24 ENCOUNTER — Other Ambulatory Visit: Payer: Self-pay | Admitting: Endocrinology

## 2015-03-24 ENCOUNTER — Other Ambulatory Visit: Payer: Self-pay | Admitting: Family Medicine

## 2015-05-11 ENCOUNTER — Encounter: Payer: Self-pay | Admitting: Endocrinology

## 2015-05-11 ENCOUNTER — Ambulatory Visit (INDEPENDENT_AMBULATORY_CARE_PROVIDER_SITE_OTHER): Payer: No Typology Code available for payment source | Admitting: Endocrinology

## 2015-05-11 VITALS — BP 124/74 | HR 78 | Temp 97.7°F | Ht 65.25 in | Wt 142.0 lb

## 2015-05-11 DIAGNOSIS — E0590 Thyrotoxicosis, unspecified without thyrotoxic crisis or storm: Secondary | ICD-10-CM | POA: Diagnosis not present

## 2015-05-11 DIAGNOSIS — E785 Hyperlipidemia, unspecified: Secondary | ICD-10-CM

## 2015-05-11 NOTE — Patient Instructions (Addendum)
Please do your thyroid blood tests, at your annual visit in May with Dr Deborra Medina.  Please come back here for a follow-up appointment in 9 months (or if you want, you could have this checked twice a year with Dr Deborra Medina, and come back here as needed).  if ever you have fever while taking methimazole, stop it and call us, because of the risk of a rare side-effect.

## 2015-05-11 NOTE — Progress Notes (Signed)
Subjective:    Patient ID: Anna Moore, female    DOB: 12-16-1955, 60 y.o.   MRN: [id number]  HPI Pt returns for f/u of hyperthyroidism (dx'ed 2015, on a routine blood test; tapazole was chosen as rx, as it is mild; she has never had thyroid imaging).  She takes tapazole as rx'ed.  She denies tremor.   Past Medical History  Diagnosis Date  . Cough     Questionably ACE related  . Fatigue     Significant fatigue in the setting of back pain and diaphoresis  . History of tobacco abuse   . Fatigue   . Palpitations   . Forgetfulness   . Hot flashes   . Hypertension   . Anxiety   . DJD (degenerative joint disease)     Of the back    Past Surgical History  Procedure Laterality Date  . Back surgery  01/2008    On L4 and L5  . Other surgical history  1998    Hysterectomy  . Parotidectomy  1990  . Nasal septum surgery    . Abdominal hysterectomy      Social History   Social History  . Marital Status: Married    Spouse Name: N/A  . Number of Children: N/A  . Years of Education: N/A   Occupational History  . Not on file.   Social History Main Topics  . Smoking status: Current Some Day Smoker    Types: Cigarettes  . Smokeless tobacco: Never Used     Comment: while drinking  . Alcohol Use: 3.6 oz/week    6 Cans of beer per week     Comment: occasional  . Drug Use: No  . Sexual Activity: Not on file   Other Topics Concern  . Not on file   Social History Narrative   Married.  One daughter, 65 yo- moving back in with her.   Retired Medical illustrator.    Current Outpatient Prescriptions on File Prior to Visit  Medication Sig Dispense Refill  . amLODipine (NORVASC) 10 MG tablet TAKE ONE TABLET BY MOUTH ONCE DAILY 30 tablet 6  . aspirin 81 MG tablet Take 81 mg by mouth every other day.    . estradiol (ESTRACE) 1 MG tablet TAKE ONE TABLET BY MOUTH TWICE DAILY 60 tablet 4  . hydrochlorothiazide (MICROZIDE) 12.5 MG capsule TAKE ONE CAPSULE BY MOUTH ONCE DAILY 30  capsule 4  . methimazole (TAPAZOLE) 5 MG tablet TAKE ONE TABLET BY MOUTH ONCE DAILY 30 tablet 3  . methocarbamol (ROBAXIN) 500 MG tablet Take 500 mg by mouth as needed.      No current facility-administered medications on file prior to visit.    No Known Allergies  Family History  Problem Relation Age of Onset  . Arrhythmia Mother   . Hypertension Father   . Heart disease Father   . Heart attack Father     32's  . Cancer Father     non hodgkins lymphoma  . Colon cancer Neg Hx   . Thyroid disease Neg Hx     BP 124/74 mmHg  Pulse 78  Temp(Src) 97.7 F (36.5 C) (Oral)  Ht 5' 5.25" (1.657 m)  Wt 142 lb (64.411 kg)  BMI 23.46 kg/m2  SpO2 96%  Review of Systems Denies fever.     Objective:   Physical Exam VITAL SIGNS:  See vs page GENERAL: no distress NECK: Thyroid is slightly and diffusely enlarged.  No thyroid nodule is palpable.  Nodes: No palpable lymphadenopathy at the anterior neck.  SKIN: not diaphoretic.  NEURO: no tremor.      Assessment & Plan:  Hyperthyroidism: clinically well-controlled.   Patient is advised the following: Patient Instructions  Please do your thyroid blood tests, at your annual visit in May with Dr Deborra Medina.  Please come back here for a follow-up appointment in 9 months (or if you want, you could have this checked twice a year with Dr Deborra Medina, and come back here as needed).  if ever you have fever while taking methimazole, stop it and call us, because of the risk of a rare side-effect.

## 2015-05-26 ENCOUNTER — Encounter: Payer: Self-pay | Admitting: Gastroenterology

## 2015-08-02 ENCOUNTER — Ambulatory Visit (INDEPENDENT_AMBULATORY_CARE_PROVIDER_SITE_OTHER): Payer: No Typology Code available for payment source | Admitting: Family Medicine

## 2015-08-02 ENCOUNTER — Other Ambulatory Visit: Payer: Self-pay | Admitting: Family Medicine

## 2015-08-02 ENCOUNTER — Encounter: Payer: Self-pay | Admitting: Family Medicine

## 2015-08-02 VITALS — BP 116/62 | HR 68 | Temp 97.7°F | Ht 65.25 in | Wt 137.2 lb

## 2015-08-02 DIAGNOSIS — M159 Polyosteoarthritis, unspecified: Secondary | ICD-10-CM

## 2015-08-02 DIAGNOSIS — M150 Primary generalized (osteo)arthritis: Secondary | ICD-10-CM

## 2015-08-02 DIAGNOSIS — Z1159 Encounter for screening for other viral diseases: Secondary | ICD-10-CM

## 2015-08-02 DIAGNOSIS — Z0000 Encounter for general adult medical examination without abnormal findings: Secondary | ICD-10-CM | POA: Diagnosis not present

## 2015-08-02 DIAGNOSIS — E0590 Thyrotoxicosis, unspecified without thyrotoxic crisis or storm: Secondary | ICD-10-CM

## 2015-08-02 DIAGNOSIS — E785 Hyperlipidemia, unspecified: Secondary | ICD-10-CM

## 2015-08-02 DIAGNOSIS — Z720 Tobacco use: Secondary | ICD-10-CM

## 2015-08-02 DIAGNOSIS — Z79890 Hormone replacement therapy (postmenopausal): Secondary | ICD-10-CM | POA: Diagnosis not present

## 2015-08-02 DIAGNOSIS — I10 Essential (primary) hypertension: Secondary | ICD-10-CM

## 2015-08-02 DIAGNOSIS — F419 Anxiety disorder, unspecified: Secondary | ICD-10-CM

## 2015-08-02 LAB — CBC WITH DIFFERENTIAL/PLATELET
BASOS ABS: 0 10*3/uL (ref 0.0–0.1)
Basophils Relative: 0.3 % (ref 0.0–3.0)
EOS PCT: 1.5 % (ref 0.0–5.0)
Eosinophils Absolute: 0.2 10*3/uL (ref 0.0–0.7)
HCT: 44 % (ref 36.0–46.0)
HEMOGLOBIN: 14.7 g/dL (ref 12.0–15.0)
LYMPHS ABS: 2.6 10*3/uL (ref 0.7–4.0)
Lymphocytes Relative: 24.9 % (ref 12.0–46.0)
MCHC: 33.3 g/dL (ref 30.0–36.0)
MCV: 93.5 fl (ref 78.0–100.0)
MONO ABS: 0.7 10*3/uL (ref 0.1–1.0)
MONOS PCT: 6.7 % (ref 3.0–12.0)
NEUTROS PCT: 66.6 % (ref 43.0–77.0)
Neutro Abs: 6.8 10*3/uL (ref 1.4–7.7)
Platelets: 353 10*3/uL (ref 150.0–400.0)
RBC: 4.71 Mil/uL (ref 3.87–5.11)
RDW: 14.1 % (ref 11.5–15.5)
WBC: 10.3 10*3/uL (ref 4.0–10.5)

## 2015-08-02 LAB — COMPREHENSIVE METABOLIC PANEL
ALBUMIN: 4.7 g/dL (ref 3.5–5.2)
ALK PHOS: 66 U/L (ref 39–117)
ALT: 17 U/L (ref 0–35)
AST: 16 U/L (ref 0–37)
BILIRUBIN TOTAL: 0.4 mg/dL (ref 0.2–1.2)
BUN: 16 mg/dL (ref 6–23)
CO2: 29 mEq/L (ref 19–32)
Calcium: 10 mg/dL (ref 8.4–10.5)
Chloride: 103 mEq/L (ref 96–112)
Creatinine, Ser: 0.65 mg/dL (ref 0.40–1.20)
GFR: 98.68 mL/min (ref >60.00)
GLUCOSE: 96 mg/dL (ref 70–99)
POTASSIUM: 3.7 meq/L (ref 3.5–5.1)
SODIUM: 138 meq/L (ref 135–145)
TOTAL PROTEIN: 6.8 g/dL (ref 6.0–8.3)

## 2015-08-02 LAB — LIPID PANEL
CHOL/HDL RATIO: 3
Cholesterol: 230 mg/dL — ABNORMAL HIGH (ref 0–200)
HDL: 76.2 mg/dL (ref >39.00)
LDL CALC: 131 mg/dL — AB (ref 0–99)
NONHDL: 153.66
Triglycerides: 114 mg/dL (ref 0.0–149.0)
VLDL: 22.8 mg/dL (ref 0.0–40.0)

## 2015-08-02 LAB — TSH: TSH: 0.61 u[IU]/mL (ref 0.35–4.50)

## 2015-08-02 LAB — T4, FREE: Free T4: 0.73 ng/dL (ref 0.60–1.60)

## 2015-08-02 MED ORDER — HYDROCHLOROTHIAZIDE 12.5 MG PO CAPS: 12.5000 mg | ORAL_CAPSULE | Freq: Every day | ORAL | Status: DC

## 2015-08-02 NOTE — Assessment & Plan Note (Addendum)
Reviewed preventive care protocols, scheduled due services, and updated immunizations Discussed nutrition, exercise, diet, and healthy lifestyle.  She will call her insurance company regarding zostavax coverage.  She will call Dr. Ardis Hughs about scheduling follow up colonoscopy.  She will also schedule mammogram.

## 2015-08-02 NOTE — Addendum Note (Signed)
Addended by: Marchia Bond on: 08/02/2015 02:47 PM   Modules accepted: Miquel Dunn

## 2015-08-02 NOTE — Assessment & Plan Note (Signed)
Well controlled.  No changes made. 

## 2015-08-02 NOTE — Progress Notes (Signed)
Subjective:    Patient ID: Anna Moore, female    DOB: 1955-12-03, 60 y.o.   MRN: [id number]  HPI  Very pleasant 60 yo female here for CPX   Colonoscopy 06/02/12 Ardis Hughs)- 3 year recall. Mammogram 05/24/14 Pap smear 06/17/09 ([facility]- scanned in [place]). S/p hysterectomy.   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   Dad currently at [facility].  She feels she is coping with the stress ok.  Lab Results  Component Value Date   CHOL 238* 07/28/2014   HDL 81.10 07/28/2014   LDLCALC 140* 07/28/2014   LDLDIRECT 127.1 04/30/2012   TRIG 83.0 07/28/2014   CHOLHDL 3 07/28/2014   Hyperthyroidism- well controlled on Methimazole.  Followed by Dr. Loanne Drilling. Last saw him on 05/11/15.  Note reviewed.  No changes made to rxs.  Lab Results  Component Value Date   TSH 0.47 12/03/2014    HTN- on HCTZ 12.5 mg and norvasc 10 mg daily.  No HA, blurred vision, CP or SOB.  Lab Results  Component Value Date   CREATININE 0.63 05/19/2014    Lab Results  Component Value Date   TSH 0.47 12/03/2014   Lab Results  Component Value Date   WBC 9.8 05/19/2014   HGB 15.2* 05/19/2014   HCT 44.6 05/19/2014   MCV 91.8 05/19/2014   PLT 337.0 05/19/2014   Lab Results  Component Value Date   NA 137 05/19/2014   K 3.8 05/19/2014   CL 100 05/19/2014   CO2 31 05/19/2014    HRT- s/p hysterectomy but does still have ovaries.  On estrace- attempted to wean off but hot flashes and insomnia were unbearable.  Tobacco abuse- She does still smoke 1-2 cigarettes per week- "I'm a stress smoker.".   Patient Active Problem List   Diagnosis Date Noted  . Postmenopausal HRT (hormone replacement therapy) 07/28/2014  . Hyperlipidemia 05/20/2014  . Hyperthyroidism 09/22/2013  . Routine general medical examination at a health care facility 07/16/2013  . Insomnia 07/16/2013  . Post menopausal problems 04/30/2012  . Tobacco abuse 04/30/2012  . Eczema, dyshidrotic 04/30/2012  .  Hypertension   . Anxiety   . DJD (degenerative joint disease)   . Palpitations    Past Medical History  Diagnosis Date  . Cough     Questionably ACE related  . Fatigue     Significant fatigue in the setting of back pain and diaphoresis  . History of tobacco abuse   . Fatigue   . Palpitations   . Forgetfulness   . Hot flashes   . Hypertension   . Anxiety   . DJD (degenerative joint disease)     Of the back   Past Surgical History  Procedure Laterality Date  . Back surgery  01/2008    On L4 and L5  . Other surgical history  1998    Hysterectomy  . Parotidectomy  1990  . Nasal septum surgery    . Abdominal hysterectomy     Social History  Substance Use Topics  . Smoking status: Current Some Day Smoker    Types: Cigarettes  . Smokeless tobacco: Never Used     Comment: while drinking  . Alcohol Use: 3.6 oz/week    6 Cans of beer per week     Comment: occasional   Family History  Problem Relation Age of Onset  . Arrhythmia Mother   . Hypertension Father   . Heart disease Father   . Heart attack Father  106's  . Cancer Father     non hodgkins lymphoma  . Colon cancer Neg Hx   . Thyroid disease Neg Hx    No Known Allergies Current Outpatient Prescriptions on File Prior to Visit  Medication Sig Dispense Refill  . estradiol (ESTRACE) 1 MG tablet TAKE ONE TABLET BY MOUTH TWICE DAILY 60 tablet 4  . methimazole (TAPAZOLE) 5 MG tablet TAKE ONE TABLET BY MOUTH ONCE DAILY 30 tablet 3  . methocarbamol (ROBAXIN) 500 MG tablet Take 500 mg by mouth as needed.      No current facility-administered medications on file prior to visit.   The PMH, PSH, Social History, Family History, Medications, and allergies have been reviewed in [facility], and have been updated if relevant.   Review of Systems  Constitutional: Positive for fatigue. Negative for unexpected weight change.  HENT: Negative.   Eyes: Negative.  Negative for discharge.  Respiratory: Negative.   Endocrine:  Negative.   Genitourinary: Negative.   Musculoskeletal: Negative.   Skin: Negative.   Allergic/Immunologic: Negative.   Neurological: Negative.   Hematological: Negative.   Psychiatric/Behavioral: Negative.        Objective:   Physical Exam BP 116/62 mmHg  Pulse 68  Temp(Src) 97.7 F (36.5 C) (Oral)  Ht 5' 5.25" (1.657 m)  Wt 137 lb 4 oz (62.256 kg)  BMI 22.67 kg/m2  SpO2 97%   General:  Well-developed,well-nourished,in no acute distress; alert,appropriate and cooperative throughout examination Head:  normocephalic and atraumatic.   Eyes:  vision grossly intact, pupils equal, pupils round, and pupils reactive to light.   Ears:  R ear normal and L ear normal.   Nose:  no external deformity.   Mouth:  good dentition.   Neck:  No deformities, masses, or tenderness noted. Breasts:  No mass, nodules, thickening, tenderness, bulging, retraction, inflamation, nipple discharge or skin changes noted.   Lungs:  Normal respiratory effort, chest expands symmetrically. Lungs are clear to auscultation, no crackles or wheezes. Heart:  Normal rate and regular rhythm. S1 and S2 normal without gallop, murmur, click, rub or other extra sounds. Abdomen:  Bowel sounds positive,abdomen soft and non-tender without masses, organomegaly or hernias noted. Rectal:  no external abnormalities.   Genitalia:  Pelvic Exam:        External: normal female genitalia without lesions or masses        Vagina: normal without lesions or masses        Cervix: absent        Adnexa: normal bimanual exam without masses or fullness        Uterus: absent   Msk:  No deformity or scoliosis noted of thoracic or lumbar spine.   Extremities:  No clubbing, cyanosis, edema, or deformity noted with normal full range of motion of all joints.   Neurologic:  alert & oriented X3 and gait normal.   Skin:  Intact without suspicious lesions or rashes Cervical Nodes:  No lymphadenopathy noted Axillary Nodes:  No palpable  lymphadenopathy Psych:  Cognition and judgment appear intact. Alert and cooperative with normal attention span and concentration. No apparent delusions, illusions, hallucinations      Assessment & Plan:

## 2015-08-02 NOTE — Assessment & Plan Note (Signed)
Well controlled clinically. Due for labs. Followed by endo.

## 2015-08-02 NOTE — Patient Instructions (Addendum)
Great to see you. Please call Dr. Eugenia Pancoast office about your colonoscopy.  Please schedule your colonoscopy.  Check with your insurance to see if they will cover the shingles shot.  We will call you with your lab results and you can view them online.

## 2015-08-02 NOTE — Progress Notes (Signed)
Pre visit review using our clinic review tool, if applicable. No additional management support is needed unless otherwise documented below in the visit note. 

## 2015-08-03 LAB — HEPATITIS C ANTIBODY: HCV Ab: NEGATIVE

## 2015-08-04 ENCOUNTER — Encounter: Payer: Self-pay | Admitting: *Deleted

## 2015-08-04 ENCOUNTER — Other Ambulatory Visit: Payer: Self-pay | Admitting: Family Medicine

## 2015-08-30 ENCOUNTER — Other Ambulatory Visit: Payer: Self-pay | Admitting: Family Medicine

## 2016-03-25 NOTE — Progress Notes (Signed)
Subjective:    Patient ID: Anna Moore, female    DOB: 18-Dec-1955, 61 y.o.   MRN: [id number]  HPI Pt returns for f/u of hyperthyroidism (dx'ed 2015, on a routine blood test; tapazole was chosen as rx, as it is mild; she has never had thyroid imaging).  She takes tapazole only MWF.  She denies tremor.   Past Medical History:  Diagnosis Date  . Anxiety   . Cough    Questionably ACE related  . DJD (degenerative joint disease)    Of the back  . Fatigue    Significant fatigue in the setting of back pain and diaphoresis  . Fatigue   . Forgetfulness   . History of tobacco abuse   . Hot flashes   . Hypertension   . Palpitations     Past Surgical History:  Procedure Laterality Date  . ABDOMINAL HYSTERECTOMY    . BACK SURGERY  01/2008   On L4 and L5  . NASAL SEPTUM SURGERY    . OTHER SURGICAL HISTORY  1998   Hysterectomy  . PAROTIDECTOMY  1990    Social History   Social History  . Marital status: Married    Spouse name: N/A  . Number of children: N/A  . Years of education: N/A   Occupational History  . Not on file.   Social History Main Topics  . Smoking status: Current Some Day Smoker    Types: Cigarettes  . Smokeless tobacco: Never Used     Comment: while drinking  . Alcohol use 3.6 oz/week    6 Cans of beer per week     Comment: occasional  . Drug use: No  . Sexual activity: Not on file   Other Topics Concern  . Not on file   Social History Narrative   Married.  One daughter, 68 yo- moving back in with her.   Retired Medical illustrator.    Current Outpatient Prescriptions on File Prior to Visit  Medication Sig Dispense Refill  . amLODipine (NORVASC) 10 MG tablet TAKE ONE TABLET BY MOUTH ONCE DAILY 90 tablet 3  . estradiol (ESTRACE) 1 MG tablet TAKE ONE TABLET BY MOUTH TWICE DAILY 180 tablet 3  . hydrochlorothiazide (MICROZIDE) 12.5 MG capsule Take 1 capsule (12.5 mg total) by mouth daily. 30 capsule 10  . methocarbamol (ROBAXIN) 500 MG tablet Take  500 mg by mouth as needed.      No current facility-administered medications on file prior to visit.     No Known Allergies  Family History  Problem Relation Age of Onset  . Arrhythmia Mother   . Hypertension Father   . Heart disease Father   . Heart attack Father     7's  . Cancer Father     non hodgkins lymphoma  . Colon cancer Neg Hx   . Thyroid disease Neg Hx     BP 130/80   Pulse 82   Ht 5' 2.5" (1.588 m)   Wt 138 lb (62.6 kg)   SpO2 95%   BMI 24.84 kg/m    Review of Systems Denies fever.      Objective:   Physical Exam VITAL SIGNS:  See vs page GENERAL: no distress. NECK: Thyroid is slightly and diffusely enlarged.  No nodule is palpable at the anterior neck.   Nodes: No palpable lymphadenopathy at the anterior neck.  SKIN: not diaphoretic.  NEURO: no tremor.   Lab Results  Component Value Date   TSH 0.78 03/28/2016  Assessment & Plan:  Hyperthyroidism: well-controlled.  Please continue the same medication

## 2016-03-28 ENCOUNTER — Ambulatory Visit (INDEPENDENT_AMBULATORY_CARE_PROVIDER_SITE_OTHER): Payer: No Typology Code available for payment source | Admitting: Endocrinology

## 2016-03-28 ENCOUNTER — Encounter: Payer: Self-pay | Admitting: Endocrinology

## 2016-03-28 VITALS — BP 130/80 | HR 82 | Ht 62.5 in | Wt 138.0 lb

## 2016-03-28 DIAGNOSIS — E0590 Thyrotoxicosis, unspecified without thyrotoxic crisis or storm: Secondary | ICD-10-CM | POA: Diagnosis not present

## 2016-03-28 LAB — TSH: TSH: 0.78 u[IU]/mL (ref 0.35–4.50)

## 2016-03-28 LAB — T4, FREE: FREE T4: 0.89 ng/dL (ref 0.60–1.60)

## 2016-03-28 MED ORDER — METHIMAZOLE 5 MG PO TABS: 5.0000 mg | ORAL_TABLET | ORAL | 5 refills | Status: DC

## 2016-03-28 NOTE — Patient Instructions (Addendum)
Thyroid blood tests are requested for you today.  We'll let you know about the results.  Please come back here for a follow-up appointment in 6 months (or if you want, you could have this checked twice a year with Dr Deborra Medina, and come back here as needed).  if ever you have fever while taking methimazole, stop it and call us, because of the risk of a rare side-effect.

## 2016-04-12 ENCOUNTER — Encounter: Payer: Self-pay | Admitting: Family Medicine

## 2016-04-12 ENCOUNTER — Ambulatory Visit (INDEPENDENT_AMBULATORY_CARE_PROVIDER_SITE_OTHER): Payer: No Typology Code available for payment source | Admitting: Family Medicine

## 2016-04-12 VITALS — BP 124/70 | HR 111 | Temp 98.7°F | Ht 65.25 in | Wt 132.5 lb

## 2016-04-12 DIAGNOSIS — E860 Dehydration: Secondary | ICD-10-CM | POA: Diagnosis not present

## 2016-04-12 DIAGNOSIS — B349 Viral infection, unspecified: Secondary | ICD-10-CM | POA: Diagnosis not present

## 2016-04-12 DIAGNOSIS — R1110 Vomiting, unspecified: Secondary | ICD-10-CM | POA: Diagnosis not present

## 2016-04-12 DIAGNOSIS — R112 Nausea with vomiting, unspecified: Secondary | ICD-10-CM | POA: Diagnosis not present

## 2016-04-12 DIAGNOSIS — R110 Nausea: Secondary | ICD-10-CM | POA: Insufficient documentation

## 2016-04-12 MED ORDER — ONDANSETRON 8 MG PO TBDP: 8.0000 mg | ORAL_TABLET | Freq: Three times a day (TID) | ORAL | 0 refills | Status: DC | PRN

## 2016-04-12 MED ORDER — PROMETHAZINE HCL 25 MG/ML IJ SOLN
25.0000 mg | Freq: Once | INTRAMUSCULAR | Status: AC
  Administered 2016-04-12: 25 mg via INTRAMUSCULAR

## 2016-04-12 NOTE — Progress Notes (Signed)
Pre visit review using our clinic review tool, if applicable. No additional management support is needed unless otherwise documented below in the visit note. 

## 2016-04-12 NOTE — Patient Instructions (Signed)
Likely viral infection.  Rest.  Given phenergan in office for nausea. Gradually push fluids such as gatorade. Start zofran for nausea as needed.  If unable to keep down fluids in next 8-10 hours go to ER for IVF.

## 2016-04-12 NOTE — Assessment & Plan Note (Signed)
Supportive care. Not clearly suggestive of flu.  No s/s of bacterial infection.

## 2016-04-12 NOTE — Progress Notes (Signed)
   Subjective:    Patient ID: Anna Moore, female    DOB: [birth year], 61 y.o.   MRN: [id number]  HPI  61 year old female presents with new onset in last week of low abd pain, chest pain, congestion, cough.  Myalgia early on.  Weak all over. Dizzy with standing.  Neck sore   Nauseous and emesis... Cannot keep down water in last few days. Generalized abd soreness, no sharp pain.  No D, no constipation.  Productive cough, yellow thick mucus. She is mildly short of breath.. Hard to get deep breath.  Chest pain with coughing fits. Nasal congestion, ear popping.  no ear pain, no  Sinus pressure  Started fever 100.1 today F.  She is peeing like normal.  Former smoker, no COPD, asthma.   No flu shot earlier.  Sick :  Friend with flu, not close contact.  Tried to take coricidin.. Could not keep it down.  Occ has kept down tylenol.     Blood pressure 124/70, pulse (!) 111, temperature 98.7 F (37.1 C), temperature source Oral, height 5' 5.25" (1.657 m), weight 132 lb 8 oz (60.1 kg). Review of Systems  Constitutional: Positive for fatigue and fever.  HENT: Negative for ear pain.   Eyes: Negative for pain.  Cardiovascular: Negative for palpitations and leg swelling.  Gastrointestinal: Positive for abdominal pain, nausea and vomiting. Negative for constipation and diarrhea.       Objective:   Physical Exam  Constitutional: Vital signs are normal. She appears well-developed and well-nourished. She is cooperative.  Non-toxic appearance. She appears ill. No distress.  HENT:  Head: Normocephalic.  Right Ear: Hearing, tympanic membrane, external ear and ear canal normal. Tympanic membrane is not erythematous, not retracted and not bulging.  Left Ear: Hearing, tympanic membrane, external ear and ear canal normal. Tympanic membrane is not erythematous, not retracted and not bulging.  Nose: No mucosal edema or rhinorrhea. Right sinus exhibits no maxillary sinus tenderness and no  frontal sinus tenderness. Left sinus exhibits no maxillary sinus tenderness and no frontal sinus tenderness.  Mouth/Throat: Uvula is midline, oropharynx is clear and moist and mucous membranes are normal.  Eyes: Conjunctivae, EOM and lids are normal. Pupils are equal, round, and reactive to light. Lids are everted and swept, no foreign bodies found.  Neck: Trachea normal and normal range of motion. Neck supple. Carotid bruit is not present. No thyroid mass and no thyromegaly present.  Cardiovascular: Regular rhythm, S1 normal, S2 normal, normal heart sounds, intact distal pulses and normal pulses.  Tachycardia present.  Exam reveals no gallop and no friction rub.   No murmur heard. Pulmonary/Chest: Effort normal and breath sounds normal. No tachypnea. No respiratory distress. She has no decreased breath sounds. She has no wheezes. She has no rhonchi. She has no rales.  Abdominal: Soft. Normal appearance and bowel sounds are normal. There is no tenderness. There is no rigidity, no rebound, no guarding and no CVA tenderness.  Neurological: She is alert.  Skin: Skin is warm, dry and intact. No rash noted.  Psychiatric: Her speech is normal and behavior is normal. Judgment and thought content normal. Her mood appears not anxious. Cognition and memory are normal. She does not exhibit a depressed mood.          Assessment & Plan:

## 2016-04-12 NOTE — Assessment & Plan Note (Signed)
Given phenergan in office. Rx for zofran ODT. If not able to keep fluids down in next 8-10 hours.. Go to ER for IVF.

## 2016-04-14 ENCOUNTER — Encounter (HOSPITAL_COMMUNITY): Payer: Self-pay | Admitting: Emergency Medicine

## 2016-04-14 ENCOUNTER — Emergency Department (HOSPITAL_COMMUNITY): Payer: No Typology Code available for payment source

## 2016-04-14 ENCOUNTER — Emergency Department (HOSPITAL_COMMUNITY)
Admission: EM | Admit: 2016-04-14 | Discharge: 2016-04-14 | Disposition: A | Discharge status: Home / Self Care | Payer: No Typology Code available for payment source | Attending: Emergency Medicine | Admitting: Emergency Medicine

## 2016-04-14 DIAGNOSIS — N390 Urinary tract infection, site not specified: Secondary | ICD-10-CM | POA: Diagnosis not present

## 2016-04-14 DIAGNOSIS — R112 Nausea with vomiting, unspecified: Secondary | ICD-10-CM | POA: Diagnosis present

## 2016-04-14 DIAGNOSIS — I10 Essential (primary) hypertension: Secondary | ICD-10-CM | POA: Diagnosis not present

## 2016-04-14 DIAGNOSIS — F17210 Nicotine dependence, cigarettes, uncomplicated: Secondary | ICD-10-CM | POA: Diagnosis not present

## 2016-04-14 LAB — COMPREHENSIVE METABOLIC PANEL
ALT: 22 U/L (ref 14–54)
AST: 27 U/L (ref 15–41)
Albumin: 4.5 g/dL (ref 3.5–5.0)
Alkaline Phosphatase: 74 U/L (ref 38–126)
Anion gap: 12 (ref 5–15)
BUN: 10 mg/dL (ref 6–20)
CO2: 25 mmol/L (ref 22–32)
Calcium: 9.3 mg/dL (ref 8.9–10.3)
Chloride: 95 mmol/L — ABNORMAL LOW (ref 101–111)
Creatinine, Ser: 0.62 mg/dL (ref 0.44–1.00)
GFR calc Af Amer: >60 mL/min (ref >60)
GFR calc non Af Amer: >60 mL/min (ref >60)
Glucose, Bld: 122 mg/dL — ABNORMAL HIGH (ref 65–99)
Potassium: 3 mmol/L — ABNORMAL LOW (ref 3.5–5.1)
Sodium: 132 mmol/L — ABNORMAL LOW (ref 135–145)
Total Bilirubin: 0.4 mg/dL (ref 0.3–1.2)
Total Protein: 7.3 g/dL (ref 6.5–8.1)

## 2016-04-14 LAB — LIPASE, BLOOD: Lipase: 15 U/L (ref 11–51)

## 2016-04-14 LAB — CBC
HCT: 48.2 % — ABNORMAL HIGH (ref 36.0–46.0)
Hemoglobin: 17.2 g/dL — ABNORMAL HIGH (ref 12.0–15.0)
MCH: 30.7 pg (ref 26.0–34.0)
MCHC: 35.7 g/dL (ref 30.0–36.0)
MCV: 85.9 fL (ref 78.0–100.0)
Platelets: 219 10*3/uL (ref 150–400)
RBC: 5.61 MIL/uL — ABNORMAL HIGH (ref 3.87–5.11)
RDW: 12.9 % (ref 11.5–15.5)
WBC: 4.8 10*3/uL (ref 4.0–10.5)

## 2016-04-14 LAB — URINALYSIS, ROUTINE W REFLEX MICROSCOPIC
Bilirubin Urine: NEGATIVE
Glucose, UA: NEGATIVE mg/dL
Ketones, ur: 5 mg/dL — AB
Nitrite: NEGATIVE
Protein, ur: 30 mg/dL — AB
Specific Gravity, Urine: 1.009 (ref 1.005–1.030)
pH: 6 (ref 5.0–8.0)

## 2016-04-14 MED ORDER — SODIUM CHLORIDE 0.9 % IV BOLUS (SEPSIS)
1000.0000 mL | Freq: Once | INTRAVENOUS | Status: AC
  Administered 2016-04-14: 1000 mL via INTRAVENOUS

## 2016-04-14 MED ORDER — ONDANSETRON HCL 4 MG PO TABS: 4.0000 mg | ORAL_TABLET | Freq: Three times a day (TID) | ORAL | 0 refills | Status: DC | PRN

## 2016-04-14 MED ORDER — PROMETHAZINE HCL 25 MG/ML IJ SOLN
12.5000 mg | Freq: Once | INTRAMUSCULAR | Status: AC
  Administered 2016-04-14: 12.5 mg via INTRAVENOUS
  Filled 2016-04-14: qty 1

## 2016-04-14 MED ORDER — HYDROMORPHONE HCL 1 MG/ML IJ SOLN
0.7500 mg | Freq: Once | INTRAMUSCULAR | Status: AC
  Administered 2016-04-14: 0.75 mg via INTRAVENOUS
  Filled 2016-04-14: qty 1

## 2016-04-14 MED ORDER — DEXTROSE 5 % IV SOLN
1.0000 g | Freq: Once | INTRAVENOUS | Status: AC
  Administered 2016-04-14: 1 g via INTRAVENOUS
  Filled 2016-04-14: qty 10

## 2016-04-14 MED ORDER — POTASSIUM CHLORIDE CRYS ER 20 MEQ PO TBCR
40.0000 meq | EXTENDED_RELEASE_TABLET | Freq: Once | ORAL | Status: AC
  Administered 2016-04-14: 40 meq via ORAL
  Filled 2016-04-14: qty 2

## 2016-04-14 MED ORDER — ONDANSETRON HCL 4 MG/2ML IJ SOLN
4.0000 mg | Freq: Once | INTRAMUSCULAR | Status: AC
  Administered 2016-04-14: 4 mg via INTRAVENOUS
  Filled 2016-04-14: qty 2

## 2016-04-14 MED ORDER — CEPHALEXIN 500 MG PO CAPS: 500.0000 mg | ORAL_CAPSULE | Freq: Three times a day (TID) | ORAL | 0 refills | Status: DC

## 2016-04-14 NOTE — ED Provider Notes (Signed)
[facility] DEPT Provider Note   CSN: [id number] Arrival date & time: 04/14/16  1408     History   Chief Complaint Chief Complaint  Patient presents with  . Emesis  . Cough    HPI Anna Moore is a 61 y.o. female.  HPI   61 year old female with nausea and vomiting since Monday. Persistent symptoms since then. She once saw her PCP on Thursday and was given a shot of Phenergan and a prescription for Zofran. She's not had continued vomiting since then but has had significant nausea and anorexia. One episode of diarrhea yesterday. Nonbloody vomitus and stool. She said complaints of persistent cough and congestion since the middle of the week. Denies any dyspnea. She felt generally weak. No urinary complaints. Her significant other at bedside reports cold-like symptoms earlier that week which has since improved. He did not have any vomiting or diarrhea though.  Past Medical History:  Diagnosis Date  . Anxiety   . Cough    Questionably ACE related  . DJD (degenerative joint disease)    Of the back  . Fatigue    Significant fatigue in the setting of back pain and diaphoresis  . Fatigue   . Forgetfulness   . History of tobacco abuse   . Hot flashes   . Hypertension   . Palpitations     Patient Active Problem List   Diagnosis Date Noted  . Acute viral syndrome 04/12/2016  . Dehydration 04/12/2016  . Nausea & vomiting 04/12/2016  . Postmenopausal HRT (hormone replacement therapy) 07/28/2014  . Hyperlipidemia 05/20/2014  . Hyperthyroidism 09/22/2013  . Routine general medical examination at a health care facility 07/16/2013  . Insomnia 07/16/2013  . Post menopausal problems 04/30/2012  . Tobacco abuse 04/30/2012  . Eczema, dyshidrotic 04/30/2012  . Hypertension   . Anxiety   . DJD (degenerative joint disease)   . Palpitations     Past Surgical History:  Procedure Laterality Date  . ABDOMINAL HYSTERECTOMY    . BACK SURGERY  01/2008   On L4 and L5  . NASAL  SEPTUM SURGERY    . OTHER SURGICAL HISTORY  1998   Hysterectomy  . PAROTIDECTOMY  1990    OB History    No data available       Home Medications    Prior to Admission medications   Medication Sig Start Date End Date Taking? Authorizing Provider  amLODipine (NORVASC) 10 MG tablet TAKE ONE TABLET BY MOUTH ONCE DAILY 08/04/15   Lucille Passy, MD  estradiol (ESTRACE) 1 MG tablet TAKE ONE TABLET BY MOUTH TWICE DAILY 08/30/15   Lucille Passy, MD  hydrochlorothiazide (MICROZIDE) 12.5 MG capsule Take 1 capsule (12.5 mg total) by mouth daily. 08/02/15   Lucille Passy, MD  methimazole (TAPAZOLE) 5 MG tablet Take 1 tablet (5 mg total) by mouth 3 (three) times a week. 03/28/16   Renato Shin, MD  methocarbamol (ROBAXIN) 500 MG tablet Take 500 mg by mouth as needed.  04/15/14   Historical Provider, MD  ondansetron (ZOFRAN ODT) 8 MG disintegrating tablet Take 1 tablet (8 mg total) by mouth every 8 (eight) hours as needed for nausea or vomiting. 04/12/16   Jinny Sanders, MD    Family History Family History  Problem Relation Age of Onset  . Arrhythmia Mother   . Hypertension Father   . Heart disease Father   . Heart attack Father     57's  . Cancer Father     non  hodgkins lymphoma  . Colon cancer Neg Hx   . Thyroid disease Neg Hx     Social History Social History  Substance Use Topics  . Smoking status: Current Some Day Smoker    Types: Cigarettes  . Smokeless tobacco: Never Used     Comment: while drinking  . Alcohol use 3.6 oz/week    6 Cans of beer per week     Comment: occasional     Allergies   Patient has no known allergies.   Review of Systems Review of Systems  All systems reviewed and negative, other than as noted in HPI.   Physical Exam Updated Vital Signs BP 159/87 (BP Location: Right Arm)   Pulse (!) 126   Temp 98.6 F (37 C) (Oral)   Resp 18   SpO2 98%   Physical Exam  Constitutional: She appears well-developed and well-nourished. She appears distressed.    Laying in bed. Appears tired but not distressed.  HENT:  Head: Normocephalic and atraumatic.  Eyes: Conjunctivae are normal. Right eye exhibits no discharge. Left eye exhibits no discharge.  Neck: Neck supple.  Cardiovascular: Regular rhythm and normal heart sounds.  Exam reveals no gallop and no friction rub.   No murmur heard. Tachycardia  Pulmonary/Chest: Effort normal and breath sounds normal. No respiratory distress.  Abdominal: Soft. She exhibits no distension. There is no tenderness.  Mild diffuse tenderness without rebound or guarding. No distention.  Musculoskeletal: She exhibits no edema or tenderness.  Neurological: She is alert.  Skin: Skin is warm and dry.  Psychiatric: She has a normal mood and affect. Her behavior is normal. Thought content normal.  Nursing note and vitals reviewed.    ED Treatments / Results  Labs (all labs ordered are listed, but only abnormal results are displayed) Labs Reviewed  URINE CULTURE - Abnormal; Notable for the following:       Result Value   Culture >=100,000 COLONIES/mL ESCHERICHIA COLI (*)    Organism ID, Bacteria ESCHERICHIA COLI (*)    All other components within normal limits  COMPREHENSIVE METABOLIC PANEL - Abnormal; Notable for the following:    Sodium 132 (*)    Potassium 3.0 (*)    Chloride 95 (*)    Glucose, Bld 122 (*)    All other components within normal limits  CBC - Abnormal; Notable for the following:    RBC 5.61 (*)    Hemoglobin 17.2 (*)    HCT 48.2 (*)    All other components within normal limits  URINALYSIS, ROUTINE W REFLEX MICROSCOPIC - Abnormal; Notable for the following:    APPearance HAZY (*)    Hgb urine dipstick SMALL (*)    Ketones, ur 5 (*)    Protein, ur 30 (*)    Leukocytes, UA SMALL (*)    Bacteria, UA MANY (*)    Squamous Epithelial / LPF 6-30 (*)    All other components within normal limits  LIPASE, BLOOD    EKG  EKG Interpretation None       Radiology No results  found.  Procedures Procedures (including critical care time)  Medications Ordered in ED Medications  sodium chloride 0.9 % bolus 1,000 mL (not administered)  ondansetron (ZOFRAN) injection 4 mg (not administered)     Initial Impression / Assessment and Plan / ED Course  I have reviewed the triage vital signs and the nursing notes.  Pertinent labs & imaging results that were available during my care of the patient were reviewed by  me and considered in my medical decision making (see chart for details).   Final Clinical Impressions(s) / ED Diagnoses   Final diagnoses:  Nausea and vomiting, intractability of vomiting not specified, unspecified vomiting type  Urinary tract infection without hematuria, site unspecified    New Prescriptions New Prescriptions   No medications on file     Virgel Manifold, MD 04/25/16 [phone]

## 2016-04-14 NOTE — ED Triage Notes (Signed)
Pt reports emesis and nasal/chest congestion since Monday. Pt reports she has been unable to keep fluids down. Only had 1 episode of diarrhea

## 2016-04-14 NOTE — ED Notes (Signed)
Patient d/c'd self care.  F/U and medications reviewed.  Patient verbalized understanding. 

## 2016-04-17 LAB — URINE CULTURE: Culture: >=100000 — AB

## 2016-04-18 ENCOUNTER — Telehealth: Payer: Self-pay | Admitting: Emergency Medicine

## 2016-04-18 NOTE — Telephone Encounter (Signed)
Post ED Visit - Positive Culture Follow-up  Culture report reviewed by antimicrobial stewardship pharmacist:  []  Elenor Quinones, Pharm.D. [x]  Heide Guile, Pharm.D., BCPS []  Parks Neptune, Pharm.D. []  Alycia Rossetti, Pharm.D., BCPS []  [place], [place].D., BCPS, AAHIVP []  Legrand Como, Pharm.D., BCPS, AAHIVP []  Milus Glazier, Pharm.D. []  Stephens November, Pharm.D.  Positive urine culture Treated with cephalexin, organism sensitive to the same and no further patient follow-up is required at this time.  Hazle Nordmann 04/18/2016, 11:04 AM

## 2016-05-01 ENCOUNTER — Ambulatory Visit (INDEPENDENT_AMBULATORY_CARE_PROVIDER_SITE_OTHER): Payer: No Typology Code available for payment source | Admitting: Family Medicine

## 2016-05-01 ENCOUNTER — Encounter: Payer: Self-pay | Admitting: Family Medicine

## 2016-05-01 DIAGNOSIS — N390 Urinary tract infection, site not specified: Secondary | ICD-10-CM | POA: Diagnosis not present

## 2016-05-01 DIAGNOSIS — R102 Pelvic and perineal pain: Secondary | ICD-10-CM | POA: Diagnosis not present

## 2016-05-01 LAB — POC URINALSYSI DIPSTICK (AUTOMATED)
BILIRUBIN UA: NEGATIVE
GLUCOSE UA: NEGATIVE
KETONES UA: NEGATIVE
Leukocytes, UA: NEGATIVE
Nitrite, UA: NEGATIVE
Protein, UA: NEGATIVE
RBC UA: NEGATIVE
Urobilinogen, UA: NEGATIVE
pH, UA: 6

## 2016-05-01 LAB — COMPREHENSIVE METABOLIC PANEL
ALT: 13 U/L (ref 0–35)
AST: 14 U/L (ref 0–37)
Albumin: 4.4 g/dL (ref 3.5–5.2)
Alkaline Phosphatase: 52 U/L (ref 39–117)
BILIRUBIN TOTAL: 0.4 mg/dL (ref 0.2–1.2)
BUN: 21 mg/dL (ref 6–23)
CHLORIDE: 103 meq/L (ref 96–112)
CO2: 29 meq/L (ref 19–32)
CREATININE: 0.63 mg/dL (ref 0.40–1.20)
Calcium: 9.6 mg/dL (ref 8.4–10.5)
GFR: 102.05 mL/min (ref >60.00)
GLUCOSE: 98 mg/dL (ref 70–99)
Potassium: 3.7 mEq/L (ref 3.5–5.1)
SODIUM: 139 meq/L (ref 135–145)
Total Protein: 6.7 g/dL (ref 6.0–8.3)

## 2016-05-01 MED ORDER — FLUCONAZOLE 150 MG PO TABS: 150.0000 mg | ORAL_TABLET | Freq: Once | ORAL | 0 refills | Status: AC

## 2016-05-01 NOTE — Progress Notes (Signed)
Pre visit review using our clinic review tool, if applicable. No additional management support is needed unless otherwise documented below in the visit note. 

## 2016-05-01 NOTE — Assessment & Plan Note (Signed)
Resolved. UA neg. Will treat with time dose of dilflucan for possible remaining yeast infection- took a topical OTC treatment. Check CMET today. The patient indicates understanding of these issues and agrees with the plan.

## 2016-05-01 NOTE — Addendum Note (Signed)
Addended by: Abelardo Diesel on: 05/01/2016 11:26 AM   Modules accepted: Orders

## 2016-05-01 NOTE — Progress Notes (Signed)
Subjective:   Patient ID: Anna Moore, female    DOB: 1955/05/08, 61 y.o.   MRN: [id number]  Anna Moore is a pleasant 61 y.o. year old female who presents to clinic today with Pelvic Pain  on 05/01/2016  HPI:   Saw Dr. Diona Browner in 04/12/2016 for vomiting. Note reviewed.  Given anti emetics and advised to go to ER if symptoms persisted.  Went to ER on 04/14/16 for persistent vomiting.  Note reviewed.  UA positive- treated with keflex. Urine cx- >100,000 E coli sensitive to cephalosporins.  After finishing keflex, felt better but developed itchy vaginal discharge.  Used OTC yeast treatment.  Feels better but still not quite herself.  Having some pelvic pressure.  No dysuria but did not have dysuria prior to going to ER.  Here today to recheck urine to make sure infection has cleared.  Dg Chest 2 View  Result Date: 04/14/2016 CLINICAL DATA:  Nausea and vomiting.  Congestion and cough. EXAM: CHEST  2 VIEW COMPARISON:  Jul 16, 2013 FINDINGS: The heart size and mediastinal contours are within normal limits. Both lungs are clear. The visualized skeletal structures are unremarkable. IMPRESSION: No active cardiopulmonary disease. Electronically Signed   By: Dorise Bullion III M.D   On: 04/14/2016 17:57       Current Outpatient Prescriptions on File Prior to Visit  Medication Sig Dispense Refill  . acetaminophen (TYLENOL) 500 MG tablet Take 500 mg by mouth every 6 (six) hours as needed (pain, fever).    Marland Kitchen amLODipine (NORVASC) 10 MG tablet TAKE ONE TABLET BY MOUTH ONCE DAILY 90 tablet 3  . estradiol (ESTRACE) 1 MG tablet TAKE ONE TABLET BY MOUTH TWICE DAILY 180 tablet 3  . hydrochlorothiazide (MICROZIDE) 12.5 MG capsule Take 1 capsule (12.5 mg total) by mouth daily. 30 capsule 10  . methimazole (TAPAZOLE) 5 MG tablet Take 1 tablet (5 mg total) by mouth 3 (three) times a week. 13 tablet 5  . ondansetron (ZOFRAN ODT) 8 MG disintegrating tablet Take 1 tablet (8 mg total) by mouth  every 8 (eight) hours as needed for nausea or vomiting. (Patient not taking: Reported on 05/01/2016) 20 tablet 0  . ondansetron (ZOFRAN) 4 MG tablet Take 1 tablet (4 mg total) by mouth every 8 (eight) hours as needed for nausea or vomiting. (Patient not taking: Reported on 05/01/2016) 12 tablet 0   No current facility-administered medications on file prior to visit.     No Known Allergies  Past Medical History:  Diagnosis Date  . Anxiety   . Cough    Questionably ACE related  . DJD (degenerative joint disease)    Of the back  . Fatigue    Significant fatigue in the setting of back pain and diaphoresis  . Fatigue   . Forgetfulness   . History of tobacco abuse   . Hot flashes   . Hypertension   . Palpitations     Past Surgical History:  Procedure Laterality Date  . ABDOMINAL HYSTERECTOMY    . BACK SURGERY  01/2008   On L4 and L5  . NASAL SEPTUM SURGERY    . OTHER SURGICAL HISTORY  1998   Hysterectomy  . PAROTIDECTOMY  1990    Family History  Problem Relation Age of Onset  . Arrhythmia Mother   . Hypertension Father   . Heart disease Father   . Heart attack Father     71's  . Cancer Father     non hodgkins lymphoma  .  Colon cancer Neg Hx   . Thyroid disease Neg Hx     Social History   Social History  . Marital status: Married    Spouse name: N/A  . Number of children: N/A  . Years of education: N/A   Occupational History  . Not on file.   Social History Main Topics  . Smoking status: Current Some Day Smoker    Types: Cigarettes  . Smokeless tobacco: Never Used     Comment: while drinking  . Alcohol use 3.6 oz/week    6 Cans of beer per week     Comment: occasional  . Drug use: No  . Sexual activity: Not on file   Other Topics Concern  . Not on file   Social History Narrative   Married.  One daughter, 16 yo- moving back in with her.   Retired Medical illustrator.   The PMH, PSH, Social History, Family History, Medications, and allergies have been  reviewed in [facility], and have been updated if relevant.  Review of Systems  Constitutional: Negative.   Gastrointestinal: Negative for abdominal distention, abdominal pain, anal bleeding, constipation, diarrhea, nausea and vomiting.  Genitourinary: Positive for pelvic pain. Negative for difficulty urinating, dyspareunia, dysuria, hematuria, urgency, vaginal bleeding, vaginal discharge and vaginal pain.  Musculoskeletal: Negative.   All other systems reviewed and are negative.      Objective:    BP 118/68 (BP Location: Right Arm, Patient Position: Sitting, Cuff Size: Normal)   Pulse 80   Temp 97.6 F (36.4 C) (Oral)   Ht 5' 5.25" (1.657 m)   Wt 134 lb (60.8 kg)   SpO2 100%   BMI 22.13 kg/m    Physical Exam  Constitutional: She is oriented to person, place, and time. She appears well-developed and well-nourished. No distress.  HENT:  Head: Normocephalic and atraumatic.  Eyes: Conjunctivae are normal.  Cardiovascular: Normal rate.   Pulmonary/Chest: Effort normal.  Abdominal: Soft. Bowel sounds are normal. She exhibits no distension. There is no tenderness. There is no rebound and no guarding.  Neurological: She is alert and oriented to person, place, and time. No cranial nerve deficit.  Skin: Skin is warm and dry. She is not diaphoretic.  Psychiatric: She has a normal mood and affect. Her behavior is normal. Judgment and thought content normal.  Nursing note and vitals reviewed.         Assessment & Plan:   Urinary tract infection without hematuria, site unspecified  Pelvic pain No Follow-up on file.

## 2016-05-01 NOTE — Patient Instructions (Signed)
Great to see you. Your urine was clear which is great news.  Please take the one time dose of Diflucan.  I will call you with your lab results.

## 2016-05-02 ENCOUNTER — Telehealth: Payer: Self-pay | Admitting: Family Medicine

## 2016-05-02 NOTE — Telephone Encounter (Signed)
Patient received Araceli's message.  Patient said she's feeling ok.  She said the antibiotic upset her stomach, but she's feeling better. Patient said she forgot to mention that she's having a hard time sleeping.  Patient said she'd ike to have a rx called in to her pharmacy for something to help her sleep.  She said she doesn't want to take it all the time, but occasionally. Patient uses [facility] [place].

## 2016-05-03 NOTE — Telephone Encounter (Signed)
Unfortunately unless she takes something like Unisom, benadryl, or melatonin OTC, she would need to be seen to prescribe a prescription sleep aid like ambien.

## 2016-05-03 NOTE — Telephone Encounter (Signed)
Pt states she has tried Melatonin but is willing to try Unisom---pt expressed understanding that she needs an OV for Rx sleep aid... She declines at this time

## 2016-06-19 ENCOUNTER — Encounter: Payer: Self-pay | Admitting: Family Medicine

## 2016-08-06 ENCOUNTER — Ambulatory Visit (INDEPENDENT_AMBULATORY_CARE_PROVIDER_SITE_OTHER): Payer: No Typology Code available for payment source | Admitting: Family Medicine

## 2016-08-06 ENCOUNTER — Encounter: Payer: Self-pay | Admitting: Family Medicine

## 2016-08-06 VITALS — BP 120/80 | HR 77 | Ht 65.5 in | Wt 133.5 lb

## 2016-08-06 DIAGNOSIS — I10 Essential (primary) hypertension: Secondary | ICD-10-CM

## 2016-08-06 DIAGNOSIS — Z1211 Encounter for screening for malignant neoplasm of colon: Secondary | ICD-10-CM

## 2016-08-06 DIAGNOSIS — E0590 Thyrotoxicosis, unspecified without thyrotoxic crisis or storm: Secondary | ICD-10-CM | POA: Diagnosis not present

## 2016-08-06 DIAGNOSIS — Z79890 Hormone replacement therapy (postmenopausal): Secondary | ICD-10-CM | POA: Diagnosis not present

## 2016-08-06 DIAGNOSIS — E785 Hyperlipidemia, unspecified: Secondary | ICD-10-CM

## 2016-08-06 DIAGNOSIS — G4700 Insomnia, unspecified: Secondary | ICD-10-CM | POA: Diagnosis not present

## 2016-08-06 DIAGNOSIS — Z0000 Encounter for general adult medical examination without abnormal findings: Secondary | ICD-10-CM | POA: Diagnosis not present

## 2016-08-06 LAB — CBC WITH DIFFERENTIAL/PLATELET
BASOS PCT: 0.6 % (ref 0.0–3.0)
Basophils Absolute: 0.1 10*3/uL (ref 0.0–0.1)
EOS PCT: 1.6 % (ref 0.0–5.0)
Eosinophils Absolute: 0.2 10*3/uL (ref 0.0–0.7)
HEMATOCRIT: 45 % (ref 36.0–46.0)
HEMOGLOBIN: 15.2 g/dL — AB (ref 12.0–15.0)
LYMPHS PCT: 18.5 % (ref 12.0–46.0)
Lymphs Abs: 1.8 10*3/uL (ref 0.7–4.0)
MCHC: 33.7 g/dL (ref 30.0–36.0)
MCV: 94 fl (ref 78.0–100.0)
Monocytes Absolute: 0.5 10*3/uL (ref 0.1–1.0)
Monocytes Relative: 5.8 % (ref 3.0–12.0)
NEUTROS ABS: 7 10*3/uL (ref 1.4–7.7)
Neutrophils Relative %: 73.5 % (ref 43.0–77.0)
Platelets: 348 10*3/uL (ref 150.0–400.0)
RBC: 4.79 Mil/uL (ref 3.87–5.11)
RDW: 13.6 % (ref 11.5–15.5)
WBC: 9.5 10*3/uL (ref 4.0–10.5)

## 2016-08-06 LAB — COMPREHENSIVE METABOLIC PANEL
ALBUMIN: 4.5 g/dL (ref 3.5–5.2)
ALT: 12 U/L (ref 0–35)
AST: 14 U/L (ref 0–37)
Alkaline Phosphatase: 68 U/L (ref 39–117)
BUN: 17 mg/dL (ref 6–23)
CALCIUM: 9.9 mg/dL (ref 8.4–10.5)
CHLORIDE: 104 meq/L (ref 96–112)
CO2: 30 mEq/L (ref 19–32)
CREATININE: 0.67 mg/dL (ref 0.40–1.20)
GFR: 94.97 mL/min (ref >60.00)
Glucose, Bld: 119 mg/dL — ABNORMAL HIGH (ref 70–99)
POTASSIUM: 3.8 meq/L (ref 3.5–5.1)
Sodium: 140 mEq/L (ref 135–145)
Total Bilirubin: 0.3 mg/dL (ref 0.2–1.2)
Total Protein: 6.9 g/dL (ref 6.0–8.3)

## 2016-08-06 LAB — LIPID PANEL
CHOLESTEROL: 226 mg/dL — AB (ref 0–200)
HDL: 74.4 mg/dL (ref >39.00)
LDL CALC: 131 mg/dL — AB (ref 0–99)
NonHDL: 151.66
TRIGLYCERIDES: 102 mg/dL (ref 0.0–149.0)
Total CHOL/HDL Ratio: 3
VLDL: 20.4 mg/dL (ref 0.0–40.0)

## 2016-08-06 LAB — TSH: TSH: 0.31 u[IU]/mL — ABNORMAL LOW (ref 0.35–4.50)

## 2016-08-06 LAB — T4, FREE: FREE T4: 1.02 ng/dL (ref 0.60–1.60)

## 2016-08-06 MED ORDER — ZOLPIDEM TARTRATE 5 MG PO TABS: 5.0000 mg | ORAL_TABLET | Freq: Every evening | ORAL | 0 refills | Status: DC | PRN

## 2016-08-06 NOTE — Addendum Note (Signed)
Addended by: Ellamae Sia on: 08/06/2016 10:02 AM   Modules accepted: Orders

## 2016-08-06 NOTE — Assessment & Plan Note (Signed)
>  25 min spent with patient, at least half of which was spent on counseling insomnia.  The problem of recurrent insomnia is discussed. Avoidance of caffeine sources is strongly encouraged. Sleep hygiene issues are reviewed. The use of sedative hypnotics for temporary relief is appropriate; we discussed the addictive nature of these drugs, and a one-time only prescription for prn use of a hypnotic is given, to use no more than 3 times per week for 2-3 weeks.   

## 2016-08-06 NOTE — Assessment & Plan Note (Signed)
Well controlled on current rxs. 

## 2016-08-06 NOTE — Assessment & Plan Note (Signed)
Controlled with estrace.

## 2016-08-06 NOTE — Assessment & Plan Note (Signed)
Reviewed preventive care protocols, scheduled due services, and updated immunizations Discussed nutrition, exercise, diet, and healthy lifestyle.  Refusing colonoscopy- agrees to stool cards.

## 2016-08-06 NOTE — Assessment & Plan Note (Signed)
Followed by endo.  

## 2016-08-06 NOTE — Progress Notes (Signed)
Subjective:    Patient ID: Anna Moore, female    DOB: Aug 29, 1955, 61 y.o.   MRN: [id number]  HPI  Very pleasant 61 yo female here for CPX   Colonoscopy 06/02/12 Ardis Hughs)- 3 year recall. Mammogram 06/19/16  Remote h/o hysterectomy.   Insomnia- deteriorated. We attempted trazodone last year but was ineffective. Melatonin and benadryl OTC not effective.   Lab Results  Component Value Date   CHOL 230 (H) 08/02/2015   HDL 76.20 08/02/2015   LDLCALC 131 (H) 08/02/2015   LDLDIRECT 127.1 04/30/2012   TRIG 114.0 08/02/2015   CHOLHDL 3 08/02/2015   Hyperthyroidism- well controlled on Methimazole.  Followed by Dr. Loanne Drilling. Last saw him on 03/28/16.  Note reviewed.  No changes made to rxs.  Lab Results  Component Value Date   TSH 0.78 03/28/2016    HTN- on HCTZ 12.5 mg and norvasc 10 mg daily.  No HA, blurred vision, CP or SOB.  Lab Results  Component Value Date   CREATININE 0.63 05/01/2016    Lab Results  Component Value Date   TSH 0.78 03/28/2016   Lab Results  Component Value Date   WBC 4.8 04/14/2016   HGB 17.2 (H) 04/14/2016   HCT 48.2 (H) 04/14/2016   MCV 85.9 04/14/2016   PLT 219 04/14/2016   Lab Results  Component Value Date   NA 139 05/01/2016   K 3.7 05/01/2016   CL 103 05/01/2016   CO2 29 05/01/2016    HRT- s/p hysterectomy but does still have ovaries.  On estrace- attempted to wean off but hot flashes and insomnia were unbearable.  Tobacco abuse- She does still smoke 1-2 cigarettes per week- "I'm a stress smoker.".   Patient Active Problem List   Diagnosis Date Noted  . Dehydration 04/12/2016  . Postmenopausal HRT (hormone replacement therapy) 07/28/2014  . Hyperlipidemia 05/20/2014  . Hyperthyroidism 09/22/2013  . Routine general medical examination at a health care facility 07/16/2013  . Insomnia 07/16/2013  . Post menopausal problems 04/30/2012  . Tobacco abuse 04/30/2012  . Eczema, dyshidrotic 04/30/2012  . Hypertension   .  Anxiety   . DJD (degenerative joint disease)    Past Medical History:  Diagnosis Date  . Anxiety   . Cough    Questionably ACE related  . DJD (degenerative joint disease)    Of the back  . Fatigue    Significant fatigue in the setting of back pain and diaphoresis  . Fatigue   . Forgetfulness   . History of tobacco abuse   . Hot flashes   . Hypertension   . Palpitations    Past Surgical History:  Procedure Laterality Date  . ABDOMINAL HYSTERECTOMY    . BACK SURGERY  01/2008   On L4 and L5  . NASAL SEPTUM SURGERY    . OTHER SURGICAL HISTORY  1998   Hysterectomy  . PAROTIDECTOMY  1990   Social History  Substance Use Topics  . Smoking status: Former Smoker    Types: Cigarettes    Quit date: 04/08/2016  . Smokeless tobacco: Never Used  . Alcohol use 3.6 oz/week    6 Cans of beer per week     Comment: 5 beers a week   Family History  Problem Relation Age of Onset  . Arrhythmia Mother   . Hypertension Father   . Heart disease Father   . Heart attack Father        6's  . Cancer Father  non hodgkins lymphoma  . Colon cancer Neg Hx   . Thyroid disease Neg Hx    No Known Allergies Current Outpatient Prescriptions on File Prior to Visit  Medication Sig Dispense Refill  . acetaminophen (TYLENOL) 500 MG tablet Take 500 mg by mouth every 6 (six) hours as needed (pain, fever).    Marland Kitchen amLODipine (NORVASC) 10 MG tablet TAKE ONE TABLET BY MOUTH ONCE DAILY 90 tablet 3  . estradiol (ESTRACE) 1 MG tablet TAKE ONE TABLET BY MOUTH TWICE DAILY 180 tablet 3  . hydrochlorothiazide (MICROZIDE) 12.5 MG capsule Take 1 capsule (12.5 mg total) by mouth daily. 30 capsule 10  . methimazole (TAPAZOLE) 5 MG tablet Take 1 tablet (5 mg total) by mouth 3 (three) times a week. 13 tablet 5  . ondansetron (ZOFRAN ODT) 8 MG disintegrating tablet Take 1 tablet (8 mg total) by mouth every 8 (eight) hours as needed for nausea or vomiting. 20 tablet 0  . ondansetron (ZOFRAN) 4 MG tablet Take 1 tablet  (4 mg total) by mouth every 8 (eight) hours as needed for nausea or vomiting. 12 tablet 0   No current facility-administered medications on file prior to visit.    The PMH, PSH, Social History, Family History, Medications, and allergies have been reviewed in [facility], and have been updated if relevant.   Review of Systems  Constitutional: Positive for fatigue. Negative for unexpected weight change.  HENT: Negative.   Eyes: Negative.  Negative for discharge.  Respiratory: Negative.   Endocrine: Negative.   Genitourinary: Negative.   Musculoskeletal: Negative.   Skin: Negative.   Allergic/Immunologic: Negative.   Neurological: Negative.   Hematological: Negative.   Psychiatric/Behavioral: Negative.        Objective:   Physical Exam BP 120/80   Pulse 77   Ht 5' 5.5" (1.664 m)   Wt 133 lb 8 oz (60.6 kg)   SpO2 99%   BMI 21.88 kg/m    General:  Well-developed,well-nourished,in no acute distress; alert,appropriate and cooperative throughout examination Head:  normocephalic and atraumatic.   Eyes:  vision grossly intact, pupils equal, pupils round, and pupils reactive to light.   Ears:  R ear normal and L ear normal.   Nose:  no external deformity.   Mouth:  good dentition.   Neck:  No deformities, masses, or tenderness noted. Breasts:  No mass, nodules, thickening, tenderness, bulging, retraction, inflamation, nipple discharge or skin changes noted.   Lungs:  Normal respiratory effort, chest expands symmetrically. Lungs are clear to auscultation, no crackles or wheezes. Heart:  Normal rate and regular rhythm. S1 and S2 normal without gallop, murmur, click, rub or other extra sounds. Abdomen:  Bowel sounds positive,abdomen soft and non-tender without masses, organomegaly or hernias noted. Rectal:  no external abnormalities.   Genitalia:  Pelvic Exam:        External: normal female genitalia without lesions or masses        Vagina: normal without lesions or masses        Cervix:  absent        Adnexa: normal bimanual exam without masses or fullness        Uterus: absent   Msk:  No deformity or scoliosis noted of thoracic or lumbar spine.   Extremities:  No clubbing, cyanosis, edema, or deformity noted with normal full range of motion of all joints.   Neurologic:  alert & oriented X3 and gait normal.   Skin:  Intact without suspicious lesions or rashes Cervical Nodes:  No lymphadenopathy noted Axillary Nodes:  No palpable lymphadenopathy Psych:  Cognition and judgment appear intact. Alert and cooperative with normal attention span and concentration. No apparent delusions, illusions, hallucinations      Assessment & Plan:

## 2016-08-08 ENCOUNTER — Other Ambulatory Visit: Payer: Self-pay | Admitting: Family Medicine

## 2016-08-16 ENCOUNTER — Other Ambulatory Visit: Payer: Self-pay | Admitting: Family Medicine

## 2016-08-23 ENCOUNTER — Other Ambulatory Visit: Payer: Self-pay | Admitting: Family Medicine

## 2016-10-08 ENCOUNTER — Encounter (HOSPITAL_COMMUNITY): Payer: Self-pay | Admitting: *Deleted

## 2016-10-08 ENCOUNTER — Telehealth: Payer: Self-pay | Admitting: Family Medicine

## 2016-10-08 ENCOUNTER — Emergency Department (HOSPITAL_COMMUNITY)
Admission: EM | Admit: 2016-10-08 | Discharge: 2016-10-08 | Disposition: A | Discharge status: Home / Self Care | Payer: No Typology Code available for payment source | Attending: Emergency Medicine | Admitting: Emergency Medicine

## 2016-10-08 ENCOUNTER — Emergency Department (HOSPITAL_COMMUNITY): Payer: No Typology Code available for payment source

## 2016-10-08 DIAGNOSIS — M546 Pain in thoracic spine: Secondary | ICD-10-CM | POA: Diagnosis not present

## 2016-10-08 DIAGNOSIS — Z79899 Other long term (current) drug therapy: Secondary | ICD-10-CM | POA: Diagnosis not present

## 2016-10-08 DIAGNOSIS — R531 Weakness: Secondary | ICD-10-CM

## 2016-10-08 DIAGNOSIS — E0590 Thyrotoxicosis, unspecified without thyrotoxic crisis or storm: Secondary | ICD-10-CM | POA: Insufficient documentation

## 2016-10-08 DIAGNOSIS — Z87891 Personal history of nicotine dependence: Secondary | ICD-10-CM | POA: Diagnosis not present

## 2016-10-08 DIAGNOSIS — R5383 Other fatigue: Secondary | ICD-10-CM | POA: Diagnosis present

## 2016-10-08 DIAGNOSIS — F419 Anxiety disorder, unspecified: Secondary | ICD-10-CM | POA: Insufficient documentation

## 2016-10-08 DIAGNOSIS — I10 Essential (primary) hypertension: Secondary | ICD-10-CM | POA: Insufficient documentation

## 2016-10-08 LAB — URINALYSIS, ROUTINE W REFLEX MICROSCOPIC
Bilirubin Urine: NEGATIVE
GLUCOSE, UA: NEGATIVE mg/dL
HGB URINE DIPSTICK: NEGATIVE
Ketones, ur: 15 mg/dL — AB
Leukocytes, UA: NEGATIVE
Nitrite: NEGATIVE
Protein, ur: NEGATIVE mg/dL
SPECIFIC GRAVITY, URINE: 1.015 (ref 1.005–1.030)
pH: 6.5 (ref 5.0–8.0)

## 2016-10-08 LAB — BASIC METABOLIC PANEL
Anion gap: 14 (ref 5–15)
BUN: 10 mg/dL (ref 6–20)
CALCIUM: 10 mg/dL (ref 8.9–10.3)
CHLORIDE: 103 mmol/L (ref 101–111)
CO2: 19 mmol/L — ABNORMAL LOW (ref 22–32)
CREATININE: 0.72 mg/dL (ref 0.44–1.00)
GFR calc Af Amer: >60 mL/min (ref >60)
GFR calc non Af Amer: >60 mL/min (ref >60)
Glucose, Bld: 137 mg/dL — ABNORMAL HIGH (ref 65–99)
Potassium: 3.3 mmol/L — ABNORMAL LOW (ref 3.5–5.1)
SODIUM: 136 mmol/L (ref 135–145)

## 2016-10-08 LAB — CBC
HCT: 45.6 % (ref 36.0–46.0)
Hemoglobin: 15.7 g/dL — ABNORMAL HIGH (ref 12.0–15.0)
MCH: 31 pg (ref 26.0–34.0)
MCHC: 34.4 g/dL (ref 30.0–36.0)
MCV: 90.1 fL (ref 78.0–100.0)
PLATELETS: 365 10*3/uL (ref 150–400)
RBC: 5.06 MIL/uL (ref 3.87–5.11)
RDW: 14.1 % (ref 11.5–15.5)
WBC: 12.5 10*3/uL — AB (ref 4.0–10.5)

## 2016-10-08 LAB — URINALYSIS, MICROSCOPIC (REFLEX): RBC / HPF: NONE SEEN RBC/hpf (ref 0–5)

## 2016-10-08 LAB — HEPATIC FUNCTION PANEL
ALK PHOS: 69 U/L (ref 38–126)
ALT: 14 U/L (ref 14–54)
AST: 18 U/L (ref 15–41)
Albumin: 4.4 g/dL (ref 3.5–5.0)
BILIRUBIN TOTAL: 0.8 mg/dL (ref 0.3–1.2)
Total Protein: 7 g/dL (ref 6.5–8.1)

## 2016-10-08 LAB — TROPONIN I: Troponin I: <0.03 ng/mL (ref <0.03)

## 2016-10-08 LAB — I-STAT TROPONIN, ED: Troponin i, poc: 0.01 ng/mL (ref 0.00–0.08)

## 2016-10-08 LAB — D-DIMER, QUANTITATIVE: D-Dimer, Quant: 0.38 ug/mL-FEU (ref 0.00–0.50)

## 2016-10-08 MED ORDER — TRAMADOL HCL 50 MG PO TABS: 50.0000 mg | ORAL_TABLET | Freq: Four times a day (QID) | ORAL | 0 refills | Status: DC | PRN

## 2016-10-08 MED ORDER — SODIUM CHLORIDE 0.9 % IV BOLUS (SEPSIS)
1000.0000 mL | Freq: Once | INTRAVENOUS | Status: AC
  Administered 2016-10-08: 1000 mL via INTRAVENOUS

## 2016-10-08 MED ORDER — ONDANSETRON HCL 4 MG/2ML IJ SOLN
4.0000 mg | Freq: Once | INTRAMUSCULAR | Status: AC
  Administered 2016-10-08: 4 mg via INTRAVENOUS
  Filled 2016-10-08: qty 2

## 2016-10-08 MED ORDER — IOPAMIDOL (ISOVUE-370) INJECTION 76%
INTRAVENOUS | Status: AC
Start: 2016-10-08 — End: 2016-10-08
  Administered 2016-10-08: 100 mL
  Filled 2016-10-08: qty 100

## 2016-10-08 MED ORDER — FENTANYL CITRATE (PF) 100 MCG/2ML IJ SOLN
50.0000 ug | Freq: Once | INTRAMUSCULAR | Status: AC
  Administered 2016-10-08: 50 ug via INTRAVENOUS
  Filled 2016-10-08: qty 2

## 2016-10-08 NOTE — ED Notes (Signed)
Pt transported to xray 

## 2016-10-08 NOTE — Telephone Encounter (Signed)
I spoke with pt and she was winded while talking on the phone;pt has had CP on and off for few days; pt has not felt well for 2 weeks; now pt had difficulty breathing, nauseated, sweating and fatigue also upper back pain; no CP now. Pt is at home by herself and concerned it could be her heart; no one there to take pt to hospital. Pt asked me to call 911 and her husband; both done and husband did not say what he was going to do but was notified of above information. I called pt back and the EMTs were there and I notified pt her husband was notified. FYI to Dr Deborra Medina.

## 2016-10-08 NOTE — Telephone Encounter (Signed)
Left message on answering machine at home number for patient to call back. Patient is in ED at time time.

## 2016-10-08 NOTE — ED Notes (Signed)
Pt returned to room from xray.

## 2016-10-08 NOTE — Telephone Encounter (Signed)
Please call to check on patient. 

## 2016-10-08 NOTE — ED Triage Notes (Signed)
C/o generalized weakness with back pain and feeling very tired for 2 weeks. C/o nausea with several periods of vomting last week. States she was in the ED in Feb with UTI. C/o neck pain after arrival to Ed. C/o sob at times states she feels like she can't get a deep enough breath in.

## 2016-10-08 NOTE — Telephone Encounter (Signed)
Patient Name: Anna Moore DOB: 1955/10/20 Initial Comment Caller states she has been sweating, tired, upper pain in her back. Having trouble catching her breath. Nurse Assessment Nurse: Vallery Sa, RN, Cathy Date/Time (Eastern Time): 10/08/2016 8:11:22 AM Confirm and document reason for call. If symptomatic, describe symptoms. ---[name] states she developed shortness of breath with chest pain (pain rated as a 4 on the 1 to 10 scale) about a week ago that has been coming and going. No injury in the past week. Alert and responsive. Does the patient have any new or worsening symptoms? ---Yes Will a triage be completed? ---Yes Related visit to physician within the last 2 weeks? ---No Does the PT have any chronic conditions? (i.e. diabetes, asthma, etc.) ---Yes List chronic conditions. ---Thyroid problems, High Blood Pressure Is this a behavioral health or substance abuse call? ---No Guidelines Guideline Title Affirmed Question Affirmed Notes Chest Pain [1] Intermittent chest pain or "angina" AND [2] increasing in severity or frequency (Exception: pains lasting a few seconds) Final Disposition User Go to ED Now Vallery Sa, RN, [employer] declined the Go to ER disposition. Reinforced the Go to ER disposition. She states she wants to see Dr. Deborra Medina. Called the office backline and [facility] disconnected while on hold. Notified Reena at the office. Referrals GO TO FACILITY REFUSED Disagree/Comply: Disagree Disagree/Comply Reason: Disagree with instructions

## 2016-10-08 NOTE — ED Provider Notes (Signed)
[facility] DEPT Provider Note   CSN: [id number] Arrival date & time: 10/08/16  1287     History   Chief Complaint Chief Complaint  Patient presents with  . Back Pain  . Weakness    HPI Anna Moore is a 61 y.o. female.  Patient is a 61 year old female with a history of hyperlipidemia, hypertension, anxiety and fibromyalgia who presents with vague symptoms of fatigue and upper back pain. She states over the last 2 weeks she's had increased fatigue and just feeling very tired. She's had decreased activity level. She's had some ongoing nausea with occasional episodes of vomiting. She also has pain in her upper back. She typically has pain in the center of her upper back but now has pain to the right side of her upper back. It hurts when she breathes and hurts when she moves. She denies any cough or anterior chest pain. She has a little bit of sinus congestion but no chest congestion. No known fevers. No rashes. No known tick bites. No urinary symptoms. No abdominal pain. No change in bowel habits. She has some tingling in her fingers which is not unusual for her but no other numbness or weakness to extremities.      Past Medical History:  Diagnosis Date  . Anxiety   . Cough    Questionably ACE related  . DJD (degenerative joint disease)    Of the back  . Fatigue    Significant fatigue in the setting of back pain and diaphoresis  . Fatigue   . Forgetfulness   . History of tobacco abuse   . Hot flashes   . Hypertension   . Palpitations     Patient Active Problem List   Diagnosis Date Noted  . Dehydration 04/12/2016  . Postmenopausal HRT (hormone replacement therapy) 07/28/2014  . Hyperlipidemia 05/20/2014  . Hyperthyroidism 09/22/2013  . Routine general medical examination at a health care facility 07/16/2013  . Insomnia 07/16/2013  . Post menopausal problems 04/30/2012  . Tobacco abuse 04/30/2012  . Eczema, dyshidrotic 04/30/2012  . Hypertension   . Anxiety     . DJD (degenerative joint disease)     Past Surgical History:  Procedure Laterality Date  . ABDOMINAL HYSTERECTOMY    . BACK SURGERY  01/2008   On L4 and L5  . NASAL SEPTUM SURGERY    . OTHER SURGICAL HISTORY  1998   Hysterectomy  . PAROTIDECTOMY  1990    OB History    No data available       Home Medications    Prior to Admission medications   Medication Sig Start Date End Date Taking? Authorizing Provider  acetaminophen (TYLENOL) 500 MG tablet Take 500 mg by mouth every 6 (six) hours as needed (pain, fever).    [provider]  amLODipine (NORVASC) 10 MG tablet TAKE ONE TABLET BY MOUTH ONCE DAILY 08/08/16   Lucille Passy, MD  estradiol (ESTRACE) 1 MG tablet TAKE ONE TABLET BY MOUTH TWICE DAILY 08/23/16   Lucille Passy, MD  hydrochlorothiazide (MICROZIDE) 12.5 MG capsule TAKE ONE CAPSULE BY MOUTH ONCE DAILY 08/16/16   Lucille Passy, MD  methimazole (TAPAZOLE) 5 MG tablet Take 1 tablet (5 mg total) by mouth 3 (three) times a week. 03/28/16   Renato Shin, MD  traMADol (ULTRAM) 50 MG tablet Take 1 tablet (50 mg total) by mouth every 6 (six) hours as needed. 10/08/16   Malvin Johns, MD  zolpidem (AMBIEN) 5 MG tablet Take 1 tablet (  5 mg total) by mouth at bedtime as needed for sleep. 08/06/16   Lucille Passy, MD    Family History Family History  Problem Relation Age of Onset  . Arrhythmia Mother   . Hypertension Father   . Heart disease Father   . Heart attack Father        68's  . Cancer Father        non hodgkins lymphoma  . Colon cancer Neg Hx   . Thyroid disease Neg Hx     Social History Social History  Substance Use Topics  . Smoking status: Former Smoker    Types: Cigarettes    Quit date: 04/08/2016  . Smokeless tobacco: Never Used  . Alcohol use 3.6 oz/week    6 Cans of beer per week     Comment: 5 beers a week     Allergies   Patient has no known allergies.   Review of Systems Review of Systems  Constitutional: Positive for fatigue. Negative  for chills, diaphoresis and fever.  HENT: Positive for congestion and rhinorrhea. Negative for sneezing.   Eyes: Negative.   Respiratory: Positive for shortness of breath. Negative for cough and chest tightness.   Cardiovascular: Negative for chest pain and leg swelling.  Gastrointestinal: Positive for nausea and vomiting. Negative for abdominal pain, blood in stool and diarrhea.  Genitourinary: Negative for difficulty urinating, flank pain, frequency and hematuria.  Musculoskeletal: Positive for back pain. Negative for arthralgias.  Skin: Negative for rash.  Neurological: Negative for dizziness, speech difficulty, weakness, numbness and headaches.     Physical Exam Updated Vital Signs BP (!) 168/73   Pulse 90   Temp 97.9 F (36.6 C) (Oral)   Resp 16   Ht 5\' 6"  (1.676 m)   Wt 61.2 kg (135 lb)   SpO2 98%   BMI 21.79 kg/m   Physical Exam  Constitutional: She is oriented to person, place, and time. She appears well-developed and well-nourished.  HENT:  Head: Normocephalic and atraumatic.  Eyes: Pupils are equal, round, and reactive to light.  Neck: Normal range of motion. Neck supple.  Cardiovascular: Normal rate, regular rhythm and normal heart sounds.   Pulmonary/Chest: Effort normal and breath sounds normal. No respiratory distress. She has no wheezes. She has no rales. She exhibits no tenderness.  Abdominal: Soft. Bowel sounds are normal. There is no tenderness. There is no rebound and no guarding.  Musculoskeletal: Normal range of motion. She exhibits no edema.  Mild tenderness to her right upper back, no crepitus or deformity.  There's also some mild tenderness along her mid thoracic spine which she states is typical for her.  Lymphadenopathy:    She has no cervical adenopathy.  Neurological: She is alert and oriented to person, place, and time.  Skin: Skin is warm and dry. No rash noted.  Psychiatric: She has a normal mood and affect.     ED Treatments / Results    Labs (all labs ordered are listed, but only abnormal results are displayed) Labs Reviewed  BASIC METABOLIC PANEL - Abnormal; Notable for the following:       Result Value   Potassium 3.3 (*)    CO2 19 (*)    Glucose, Bld 137 (*)    All other components within normal limits  CBC - Abnormal; Notable for the following:    WBC 12.5 (*)    Hemoglobin 15.7 (*)    All other components within normal limits  URINALYSIS, ROUTINE W REFLEX  MICROSCOPIC - Abnormal; Notable for the following:    APPearance TURBID (*)    Ketones, ur 15 (*)    All other components within normal limits  HEPATIC FUNCTION PANEL - Abnormal; Notable for the following:    Bilirubin, Direct <0.1 (*)    All other components within normal limits  URINALYSIS, MICROSCOPIC (REFLEX) - Abnormal; Notable for the following:    Bacteria, UA MANY (*)    Squamous Epithelial / LPF TOO NUMEROUS TO COUNT (*)    All other components within normal limits  URINE CULTURE  TROPONIN I  D-DIMER, QUANTITATIVE (NOT AT [facility])  ROCKY MTN SPOTTED FVR ABS PNL(IGG+IGM)  B. BURGDORFI ANTIBODIES  I-STAT TROPONIN, ED    EKG  EKG Interpretation  Date/Time:  Monday October 08 2016 09:46:44 EDT Ventricular Rate:  104 PR Interval:    QRS Duration: 93 QT Interval:  345 QTC Calculation: 938 R Axis:   38 Text Interpretation:  Sinus tachycardia Probable left atrial enlargement Anteroseptal infarct, age indeterminate SINCE LAST TRACING HEART RATE HAS INCREASED Confirmed by Malvin Johns [phone]) on 10/08/2016 9:52:02 AM       Radiology Dg Chest 2 View  Result Date: 10/08/2016 CLINICAL DATA:  Weakness.  Tightness.  Nausea. EXAM: CHEST  2 VIEW COMPARISON:  04/14/2016 FINDINGS: Lateral view degraded by patient arm position. Numerous leads and wires project over the chest. Midline trachea. Normal heart size and mediastinal contours. No pleural effusion or pneumothorax. Mild hyperinflation and interstitial thickening. No lobar consolidation. IMPRESSION:  No acute cardiopulmonary disease. Peribronchial thickening which may relate to chronic bronchitis or smoking. Electronically Signed   By: Abigail Miyamoto M.D.   On: 10/08/2016 10:50   Ct Angio Chest Aorta W/cm &/or Wo/cm  Result Date: 10/08/2016 CLINICAL DATA:  Chest pain. EXAM: CT ANGIOGRAPHY CHEST WITH CONTRAST TECHNIQUE: Multidetector CT imaging of the chest was performed using the standard protocol during bolus administration of intravenous contrast. Multiplanar CT image reconstructions and MIPs were obtained to evaluate the vascular anatomy. CONTRAST:  100 cc Isovue 370 intravenous contrast. COMPARISON:  Chest x-ray from same date. Coronary calcium scoring dated May 27, 2014. FINDINGS: Cardiovascular: No evidence of pulmonary embolism to the segmental level. No evidence of thoracic aortic aneurysm or dissection. Normal heart size. No pericardial effusion. Mediastinum/Nodes: No enlarged mediastinal, hilar, or axillary lymph nodes. Thyroid gland, trachea, and esophagus demonstrate no significant findings. Lungs/Pleura: Mild bibasilar subsegmental atelectasis. Lungs are otherwise clear. No pleural effusion or pneumothorax. Upper Abdomen: No acute abnormality. There are two low-density lesions within the hepatic segment 4A measuring up to 2.0 cm, likely cysts. Additional smaller subcentimeter low-density lesion seen segment 4B is too small to characterize. Small, lobulated appearance of the spleen, which may be related to prior infarct. Musculoskeletal: No chest wall abnormality. No acute or significant osseous findings. Review of the MIP images confirms the above findings. IMPRESSION: 1. No acute intrathoracic process. No evidence of pulmonary embolism. No evidence of acute aortic syndrome. Electronically Signed   By: Titus Dubin M.D.   On: 10/08/2016 15:03    Procedures Procedures (including critical care time)  Medications Ordered in ED Medications  sodium chloride 0.9 % bolus 1,000 mL (0 mLs  Intravenous Stopped 10/08/16 1510)  fentaNYL (SUBLIMAZE) injection 50 mcg (50 mcg Intravenous Given 10/08/16 1445)  ondansetron (ZOFRAN) injection 4 mg (4 mg Intravenous Given 10/08/16 1445)  iopamidol (ISOVUE-370) 76 % injection (100 mLs  Contrast Given 10/08/16 1438)     Initial Impression / Assessment and Plan / ED  Course  I have reviewed the triage vital signs and the nursing notes.  Pertinent labs & imaging results that were available during my care of the patient were reviewed by me and considered in my medical decision making (see chart for details).     Patient presents with fatigue associated with some right sided thoracic back pain and nausea. This is been going on for about 2 weeks. She's afebrile here. She's mildly tachycardic although this has improved after IV fluids and pain medication. She had a CT scan of her chest which showed no evidence of acute disease. There is no suggestions of pulmonary embolus. Her aorta appears to be normal. No masses are noted. No evidence of pneumonia. Her other labs are non-concerning. Her white count is a little bit elevated but she doesn't have other source of infection. She has some mild tenderness to her thoracic spine but this seems to be baseline for her. Her main tenderness seems to be to the right upper back more in the musculature. Therefore I would doubt other etiologies for infection such as discitis. Her urine showed some bacteria but she also had a large amount of squamous cell and no symptoms of a UTI. This was sent for culture.  She was discharged home in good condition. She was advised to have close follow-up with her PCP. Return precautions were given. She was given prescription for tramadol for pain.  Final Clinical Impressions(s) / ED Diagnoses   Final diagnoses:  Weakness  Acute right-sided thoracic back pain    New Prescriptions New Prescriptions   TRAMADOL (ULTRAM) 50 MG TABLET    Take 1 tablet (50 mg total) by mouth every 6 (six)  hours as needed.     Malvin Johns, MD 10/08/16 1600

## 2016-10-09 LAB — B. BURGDORFI ANTIBODIES

## 2016-10-10 LAB — ROCKY MTN SPOTTED FVR ABS PNL(IGG+IGM)
RMSF IgG: NEGATIVE
RMSF IgM: 0.23 index (ref 0.00–0.89)

## 2016-10-11 ENCOUNTER — Encounter: Payer: Self-pay | Admitting: Family Medicine

## 2016-10-11 ENCOUNTER — Ambulatory Visit (INDEPENDENT_AMBULATORY_CARE_PROVIDER_SITE_OTHER): Payer: No Typology Code available for payment source | Admitting: Family Medicine

## 2016-10-11 DIAGNOSIS — F410 Panic disorder [episodic paroxysmal anxiety] without agoraphobia: Secondary | ICD-10-CM | POA: Diagnosis not present

## 2016-10-11 DIAGNOSIS — N390 Urinary tract infection, site not specified: Secondary | ICD-10-CM

## 2016-10-11 DIAGNOSIS — R531 Weakness: Secondary | ICD-10-CM | POA: Diagnosis not present

## 2016-10-11 MED ORDER — CIPROFLOXACIN HCL 250 MG PO TABS: 250.0000 mg | ORAL_TABLET | Freq: Two times a day (BID) | ORAL | 0 refills | Status: DC

## 2016-10-11 MED ORDER — ALPRAZOLAM 0.25 MG PO TABS: 0.2500 mg | ORAL_TABLET | Freq: Two times a day (BID) | ORAL | 0 refills | Status: DC | PRN

## 2016-10-11 MED ORDER — CEFTRIAXONE SODIUM 1 G IJ SOLR
1.0000 g | Freq: Once | INTRAMUSCULAR | Status: AC
  Administered 2016-10-11: 1 g via INTRAMUSCULAR

## 2016-10-11 NOTE — Assessment & Plan Note (Signed)
Urine cx pos- treat with IM rocephin in office and oral cipro 250 mg twice daily x 5 days. A.gree

## 2016-10-11 NOTE — Assessment & Plan Note (Signed)
Likely due to UTI. See above.

## 2016-10-11 NOTE — Assessment & Plan Note (Signed)
New- and likely due to anxiety from not feeling well and being concerned about her health. Given low dose rx for xanax- discussed sedation and addiction potential. Call or return to clinic prn if these symptoms worsen or fail to improve as anticipated. The patient indicates understanding of these issues and agrees with the plan.

## 2016-10-11 NOTE — Progress Notes (Signed)
Subjective:   Patient ID: Anna Moore, female    DOB: Jul 15, 1955, 61 y.o.   MRN: [id number]  Anna Moore is a pleasant 61 y.o. year old female who presents to clinic today with Hospitalization Follow-up (SOB, BACK PAIN, SHIVERING, FATIGUE.)  on 10/11/2016  HPI:  Was seen in the ER on 10/08/16 for weakness.   Notes reviewed.  Complained of vague symptoms x 2 weeks- increased fatigue, nausea and some voimting.  A bit of back pain as well.  D dimer, troponin, RMSF titers neg CXR neg. Ct angio neg.  WBC was elevated.  Lab Results  Component Value Date   WBC 12.5 (H) 10/08/2016   HGB 15.7 (H) 10/08/2016   HCT 45.6 10/08/2016   MCV 90.1 10/08/2016   PLT 365 10/08/2016     Dg Chest 2 View  Result Date: 10/08/2016 CLINICAL DATA:  Weakness.  Tightness.  Nausea. EXAM: CHEST  2 VIEW COMPARISON:  04/14/2016 FINDINGS: Lateral view degraded by patient arm position. Numerous leads and wires project over the chest. Midline trachea. Normal heart size and mediastinal contours. No pleural effusion or pneumothorax. Mild hyperinflation and interstitial thickening. No lobar consolidation. IMPRESSION: No acute cardiopulmonary disease. Peribronchial thickening which may relate to chronic bronchitis or smoking. Electronically Signed   By: Abigail Miyamoto M.D.   On: 10/08/2016 10:50   Ct Angio Chest Aorta W/cm &/or Wo/cm  Result Date: 10/08/2016 CLINICAL DATA:  Chest pain. EXAM: CT ANGIOGRAPHY CHEST WITH CONTRAST TECHNIQUE: Multidetector CT imaging of the chest was performed using the standard protocol during bolus administration of intravenous contrast. Multiplanar CT image reconstructions and MIPs were obtained to evaluate the vascular anatomy. CONTRAST:  100 cc Isovue 370 intravenous contrast. COMPARISON:  Chest x-ray from same date. Coronary calcium scoring dated May 27, 2014. FINDINGS: Cardiovascular: No evidence of pulmonary embolism to the segmental level. No evidence of thoracic aortic  aneurysm or dissection. Normal heart size. No pericardial effusion. Mediastinum/Nodes: No enlarged mediastinal, hilar, or axillary lymph nodes. Thyroid gland, trachea, and esophagus demonstrate no significant findings. Lungs/Pleura: Mild bibasilar subsegmental atelectasis. Lungs are otherwise clear. No pleural effusion or pneumothorax. Upper Abdomen: No acute abnormality. There are two low-density lesions within the hepatic segment 4A measuring up to 2.0 cm, likely cysts. Additional smaller subcentimeter low-density lesion seen segment 4B is too small to characterize. Small, lobulated appearance of the spleen, which may be related to prior infarct. Musculoskeletal: No chest wall abnormality. No acute or significant osseous findings. Review of the MIP images confirms the above findings. IMPRESSION: 1. No acute intrathoracic process. No evidence of pulmonary embolism. No evidence of acute aortic syndrome. Electronically Signed   By: Titus Dubin M.D.   On: 10/08/2016 15:03   Urine micro was pos for bacteria but not treated for UTI. Urine cx did grow > 100,000 E coli. Still feeling very fatigued, having chills.  No dysuria.  Also having episodes of crying and SOB, feeling very anxious due to not feeling physically well.  Current Outpatient Prescriptions on File Prior to Visit  Medication Sig Dispense Refill  . acetaminophen (TYLENOL) 500 MG tablet Take 500 mg by mouth every 6 (six) hours as needed (pain, fever).    Marland Kitchen amLODipine (NORVASC) 10 MG tablet TAKE ONE TABLET BY MOUTH ONCE DAILY 30 tablet 11  . estradiol (ESTRACE) 1 MG tablet TAKE ONE TABLET BY MOUTH TWICE DAILY 180 tablet 3  . hydrochlorothiazide (MICROZIDE) 12.5 MG capsule TAKE ONE CAPSULE BY MOUTH ONCE DAILY 30 capsule 11  .  methimazole (TAPAZOLE) 5 MG tablet Take 1 tablet (5 mg total) by mouth 3 (three) times a week. 13 tablet 5  . zolpidem (AMBIEN) 5 MG tablet Take 1 tablet (5 mg total) by mouth at bedtime as needed for sleep. 30 tablet  0  . traMADol (ULTRAM) 50 MG tablet Take 1 tablet (50 mg total) by mouth every 6 (six) hours as needed. (Patient not taking: Reported on 10/11/2016) 15 tablet 0   No current facility-administered medications on file prior to visit.     No Known Allergies  Past Medical History:  Diagnosis Date  . Anxiety   . Cough    Questionably ACE related  . DJD (degenerative joint disease)    Of the back  . Fatigue    Significant fatigue in the setting of back pain and diaphoresis  . Fatigue   . Forgetfulness   . History of tobacco abuse   . Hot flashes   . Hypertension   . Palpitations     Past Surgical History:  Procedure Laterality Date  . ABDOMINAL HYSTERECTOMY    . BACK SURGERY  01/2008   On L4 and L5  . NASAL SEPTUM SURGERY    . OTHER SURGICAL HISTORY  1998   Hysterectomy  . PAROTIDECTOMY  1990    Family History  Problem Relation Age of Onset  . Arrhythmia Mother   . Hypertension Father   . Heart disease Father   . Heart attack Father        68's  . Cancer Father        non hodgkins lymphoma  . Colon cancer Neg Hx   . Thyroid disease Neg Hx     Social History   Social History  . Marital status: Married    Spouse name: N/A  . Number of children: N/A  . Years of education: N/A   Occupational History  . Not on file.   Social History Main Topics  . Smoking status: Former Smoker    Types: Cigarettes    Quit date: 04/08/2016  . Smokeless tobacco: Never Used  . Alcohol use 3.6 oz/week    6 Cans of beer per week     Comment: 5 beers a week  . Drug use: No  . Sexual activity: Not on file   Other Topics Concern  . Not on file   Social History Narrative   Married.  One daughter, 35 yo- moving back in with her.   Retired Medical illustrator.   The PMH, PSH, Social History, Family History, Medications, and allergies have been reviewed in [facility], and have been updated if relevant.  Review of Systems  Constitutional: Positive for chills, fatigue and fever.  HENT:  Negative.   Eyes: Negative.   Respiratory: Negative.   Cardiovascular: Negative.   Gastrointestinal: Positive for abdominal pain.  Endocrine: Negative.   Genitourinary: Negative.   Musculoskeletal: Negative.   Allergic/Immunologic: Negative.   Neurological: Positive for weakness.  Psychiatric/Behavioral: Negative.   All other systems reviewed and are negative.      Objective:    BP 140/90   Pulse 87   Ht 5\' 6"  (1.676 m)   Wt 129 lb (58.5 kg)   SpO2 99%   BMI 20.82 kg/m    Physical Exam  Constitutional: She is oriented to person, place, and time. She appears well-developed and well-nourished.  Very tearful and visibly anxious  HENT:  Head: Normocephalic and atraumatic.  Cardiovascular: Normal rate.   Pulmonary/Chest: Effort normal.  Musculoskeletal:  Normal range of motion.  Neurological: She is alert and oriented to person, place, and time.  Skin: Skin is warm and dry.  Psychiatric: She has a normal mood and affect. Her behavior is normal. Judgment and thought content normal.  Nursing note and vitals reviewed.         Assessment & Plan:   No diagnosis found. No Follow-up on file.

## 2016-10-11 NOTE — Patient Instructions (Signed)
Great to see you.  Please take cipro as directed- 1 tablet twice daily for 5 days.  Xanax as needed for panic attacks.  Please keep me updated.

## 2016-10-12 LAB — URINE CULTURE

## 2016-10-13 ENCOUNTER — Telehealth: Payer: Self-pay

## 2016-10-13 NOTE — Progress Notes (Signed)
ED Antimicrobial Stewardship Positive Culture Follow Up   Anna Moore is an 61 y.o. female who presented to [facility] on 10/08/2016 with a chief complaint of  Chief Complaint  Patient presents with  . Back Pain  . Weakness    Recent Results (from the past 720 hour(s))  Urine culture     Status: Abnormal   Collection Time: 10/08/16 10:20 AM  Result Value Ref Range Status   Specimen Description URINE, CLEAN CATCH  Final   Special Requests NONE  Final   Culture >=100,000 COLONIES/mL ESCHERICHIA COLI (A)  Final   Report Status 10/12/2016 FINAL  Final   Organism ID, Bacteria ESCHERICHIA COLI (A)  Final      Susceptibility   Escherichia coli - MIC*    AMPICILLIN >=32 RESISTANT Resistant     CEFAZOLIN <=4 SENSITIVE Sensitive     CEFTRIAXONE <=1 SENSITIVE Sensitive     CIPROFLOXACIN >=4 RESISTANT Resistant     GENTAMICIN <=1 SENSITIVE Sensitive     IMIPENEM <=0.25 SENSITIVE Sensitive     NITROFURANTOIN <=16 SENSITIVE Sensitive     TRIMETH/SULFA <=20 SENSITIVE Sensitive     AMPICILLIN/SULBACTAM >=32 RESISTANT Resistant     PIP/TAZO <=4 SENSITIVE Sensitive     Extended ESBL NEGATIVE Sensitive     * >=100,000 COLONIES/mL ESCHERICHIA COLI    [x]  Treated with cipro, organism resistant to prescribed antimicrobial  New antibiotic prescription: Stop Cipro. Call patient. If feeling better, no urinary symptoms (frequency, urgency, dysuria, etc.), no treatment necessary. If having symptoms, then start cephalexin 500mg  PO BID x 5 days.  ED Provider: Alyse Low, PA  Elicia Lamp, PharmD, BCPS Clinical Pharmacist 10/13/2016 10:23 AM

## 2016-10-16 ENCOUNTER — Telehealth: Payer: Self-pay

## 2016-10-16 NOTE — Telephone Encounter (Signed)
Thank you for the update.  Please continue to keep me updated.

## 2016-10-16 NOTE — Telephone Encounter (Signed)
Pt left v/m that pt was seen on 10/11/16. Pt did not have a good day on 10/15/16; pt took Xanax last night and today pt feels better. FYI to Dr Deborra Medina.

## 2016-10-16 NOTE — Telephone Encounter (Signed)
Spoke with Patient Per Dr Deborra Medina: Thank you for the update.  Please continue to keep me updated.

## 2016-10-19 ENCOUNTER — Telehealth: Payer: Self-pay

## 2016-10-19 NOTE — Telephone Encounter (Signed)
I'm sorry to hear that.  I reviewed the urine cx results and I agree that it was resistant to cipro and she should take Keflex.  How much Keflex has she taken?

## 2016-10-19 NOTE — Telephone Encounter (Signed)
Pt left v/m; pt was put on cipro for UTI; pt finished the cipro on 10/16/16 but pt still having problems, feels extremely tired; pt may feel good for a little while and then feel terrible. Pt got letter from [facility] requesting cb about lab results; pt was told that Cipro will not take care of the bacterial infection for this UTI. [facility] called in Keflex 500 mg taking one pill q 12 hours for 5 days. Pt wants Dr Hulen Shouts opinion. Pt request cb after Dr Deborra Medina reviews

## 2016-10-19 NOTE — Telephone Encounter (Signed)
Post ED Visit - Positive Culture Follow-up: Successful Patient Follow-Up  Culture assessed and recommendations reviewed by: []  Elenor Quinones, Pharm.D. []  Heide Guile, Pharm.D., BCPS AQ-ID []  Parks Neptune, Pharm.D., BCPS []  Alycia Rossetti, Pharm.D., BCPS []  [place], Pharm.D., BCPS, AAHIVP []  Legrand Como, Pharm.D., BCPS, AAHIVP []  Salome Arnt, PharmD, BCPS []  Dimitri Ped, PharmD, BCPS []  Vincenza Hews, PharmD, BCPS Elicia Lamp Pharm D Positive urine culture  []  Patient discharged without antimicrobial prescription and treatment is now indicated [x]  Organism is resistant to prescribed ED discharge antimicrobial []  Patient with positive blood cultures  Changes discussed with ED provider: Alyse Low PA New antibiotic prescription Cephalexin 500 mg PO BID x 5 days Called to Suzie Port[facility]) [phone]  Contacted patient, date 10/19/17, time 75   Heath Badon, Carolynn Comment 10/19/2016, 2:24 PM

## 2016-10-22 NOTE — Telephone Encounter (Signed)
Yes that's great.  Thank you.

## 2016-10-22 NOTE — Telephone Encounter (Signed)
Yes can you please put on my schedule tomorrow?

## 2016-10-22 NOTE — Telephone Encounter (Signed)
Spoke with patient she is still not feeling well due to UTI infection. [facility] prescribed Keflex 500 mg BID on 10/19/16 for 5 days. Patient states she was not doing better until now. She is suppose to take her last Rx until Wednesday 10/24/16 and wanted to know if she need to come in to be revaluated to make sure it is gone.

## 2016-10-22 NOTE — Telephone Encounter (Signed)
Was unable to make appt for Tuesday morning due to inconvenience of someone taking her and scheduled booked. Was able to give appt for Wednesday 9 am on 10/24/2016. Does that work for you?

## 2016-10-22 NOTE — Telephone Encounter (Signed)
Good morning ,  I'm not in this location this week

## 2016-10-24 ENCOUNTER — Encounter: Payer: Self-pay | Admitting: Family Medicine

## 2016-10-24 ENCOUNTER — Ambulatory Visit (INDEPENDENT_AMBULATORY_CARE_PROVIDER_SITE_OTHER): Payer: No Typology Code available for payment source | Admitting: Family Medicine

## 2016-10-24 VITALS — BP 130/82 | HR 78 | Temp 97.8°F | Resp 16 | Ht 65.5 in | Wt 128.8 lb

## 2016-10-24 DIAGNOSIS — R531 Weakness: Secondary | ICD-10-CM

## 2016-10-24 DIAGNOSIS — N390 Urinary tract infection, site not specified: Secondary | ICD-10-CM | POA: Diagnosis not present

## 2016-10-24 DIAGNOSIS — R0609 Other forms of dyspnea: Secondary | ICD-10-CM

## 2016-10-24 LAB — POCT URINALYSIS DIPSTICK
Bilirubin, UA: NEGATIVE
Blood, UA: NEGATIVE
Glucose, UA: NEGATIVE
Ketones, UA: NEGATIVE
LEUKOCYTES UA: NEGATIVE
Nitrite, UA: NEGATIVE
PROTEIN UA: NEGATIVE
Spec Grav, UA: >=1.03 — AB (ref 1.010–1.025)
UROBILINOGEN UA: 0.2 U/dL
pH, UA: 6 (ref 5.0–8.0)

## 2016-10-24 MED ORDER — METHIMAZOLE 5 MG PO TABS: 5.0000 mg | ORAL_TABLET | ORAL | 5 refills | Status: DC

## 2016-10-24 NOTE — Assessment & Plan Note (Signed)
Anxiety likely playing a role but she is refusing daily SSRI or psych referral. Neg cardiac work up thus far but given persistent DOE and patient's concerns, agree that she should have another stress test. Referral placed to cardiology.

## 2016-10-24 NOTE — Progress Notes (Signed)
Subjective:   Patient ID: Anna Moore, female    DOB: Jan 16, 1956, 61 y.o.   MRN: [id number]  Betty Daidone is a pleasant 61 y.o. year old female who presents to clinic today with Abdominal Pain; Shortness of Breath (chest discomfort, ); and Back Pain (upper back )  on 10/24/2016  HPI:  Persistent DOE-  Was seen in the ER on 10/08/16 for weakness.   Notes reviewed.  Complained of vague symptoms x 2 weeks- increased fatigue, nausea and some voimting.  A bit of back pain as well.  D dimer, troponin, RMSF titers neg CXR neg. Ct angio neg.  WBC was elevated.  I saw her on 10/11/16 for follow up. Notes reviewed.  Urine micro was pos for bacteria but not treated for UTI in ER. Urine cx did grow > 100,000 E coli. Still feeling very fatigued, having chills so given IM rocephin in office and sent home on oral cipro.  Unfortunately, sensitivities returned showing resistance to cipro and this was changed to Keflex.   Finished last dose of Keflex today.  Also was having episodes of crying and SOB, feeling very anxious due to not feeling physically well.  Given low dose as needed xanax which she has taken twice and has helped.  But she feels something is wrong with her heart.  Still having DOE and sweating.  Neg myoview in 2010.    Current Outpatient Prescriptions on File Prior to Visit  Medication Sig Dispense Refill  . acetaminophen (TYLENOL) 500 MG tablet Take 500 mg by mouth every 6 (six) hours as needed (pain, fever).    . ALPRAZolam (XANAX) 0.25 MG tablet Take 1 tablet (0.25 mg total) by mouth 2 (two) times daily as needed for anxiety. 30 tablet 0  . amLODipine (NORVASC) 10 MG tablet TAKE ONE TABLET BY MOUTH ONCE DAILY 30 tablet 11  . estradiol (ESTRACE) 1 MG tablet TAKE ONE TABLET BY MOUTH TWICE DAILY 180 tablet 3  . hydrochlorothiazide (MICROZIDE) 12.5 MG capsule TAKE ONE CAPSULE BY MOUTH ONCE DAILY 30 capsule 11  . methimazole (TAPAZOLE) 5 MG tablet Take 1  tablet (5 mg total) by mouth 3 (three) times a week. 13 tablet 5  . traMADol (ULTRAM) 50 MG tablet Take 1 tablet (50 mg total) by mouth every 6 (six) hours as needed. 15 tablet 0  . zolpidem (AMBIEN) 5 MG tablet Take 1 tablet (5 mg total) by mouth at bedtime as needed for sleep. 30 tablet 0   No current facility-administered medications on file prior to visit.     No Known Allergies  Past Medical History:  Diagnosis Date  . Anxiety   . Cough    Questionably ACE related  . DJD (degenerative joint disease)    Of the back  . Fatigue    Significant fatigue in the setting of back pain and diaphoresis  . Fatigue   . Forgetfulness   . History of tobacco abuse   . Hot flashes   . Hypertension   . Palpitations     Past Surgical History:  Procedure Laterality Date  . ABDOMINAL HYSTERECTOMY    . BACK SURGERY  01/2008   On L4 and L5  . NASAL SEPTUM SURGERY    . OTHER SURGICAL HISTORY  1998   Hysterectomy  . PAROTIDECTOMY  1990    Family History  Problem Relation Age of Onset  . Arrhythmia Mother   . Hypertension Father   . Heart disease Father   . Heart attack  Father        70's  . Cancer Father        non hodgkins lymphoma  . Colon cancer Neg Hx   . Thyroid disease Neg Hx     Social History   Social History  . Marital status: Married    Spouse name: N/A  . Number of children: N/A  . Years of education: N/A   Occupational History  . Not on file.   Social History Main Topics  . Smoking status: Former Smoker    Types: Cigarettes    Quit date: 04/08/2016  . Smokeless tobacco: Never Used  . Alcohol use 3.6 oz/week    6 Cans of beer per week     Comment: 5 beers a week  . Drug use: No  . Sexual activity: Not on file   Other Topics Concern  . Not on file   Social History Narrative   Married.  One daughter, 73 yo- moving back in with her.   Retired Medical illustrator.   The PMH, PSH, Social History, Family History, Medications, and allergies have been reviewed  in [facility], and have been updated if relevant.   Review of Systems  Constitutional: Positive for fatigue.  HENT: Negative.   Eyes: Negative.   Respiratory: Positive for shortness of breath.   Cardiovascular: Positive for chest pain.  Gastrointestinal: Negative.   Endocrine: Negative.   Genitourinary: Negative.   Musculoskeletal: Negative.   Allergic/Immunologic: Negative.   Neurological: Positive for weakness.  Psychiatric/Behavioral: Negative.   All other systems reviewed and are negative.      Objective:    BP 130/82   Pulse 78   Temp 97.8 F (36.6 C) (Oral)   Resp 16   Ht 5' 5.5" (1.664 m)   Wt 128 lb 12.8 oz (58.4 kg)   SpO2 98%   BMI 21.11 kg/m    Physical Exam    General:  Well-developed,well-nourished,in no acute distress; alert,appropriate and cooperative throughout examination Head:  normocephalic and atraumatic.   Eyes:  vision grossly intact, PERRL Ears:  R ear normal and L ear normal externally, TMs clear bilaterally Nose:  no external deformity.   Mouth:  good dentition.   Neck:  No deformities, masses, or tenderness noted. Lungs:  Normal respiratory effort, chest expands symmetrically. Lungs are clear to auscultation, no crackles or wheezes. Heart:  Normal rate and regular rhythm. S1 and S2 normal without gallop, murmur, click, rub or other extra sounds. Abdomen:  Bowel sounds positive,abdomen soft and non-tender without masses, organomegaly or hernias noted. Msk:  No deformity or scoliosis noted of thoracic or lumbar spine.   Extremities:  No clubbing, cyanosis, edema, or deformity noted with normal full range of motion of all joints.   Neurologic:  alert & oriented X3 and gait normal.   Skin:  Intact without suspicious lesions or rashes Psych:  Anxious Cognition and judgment appear intact. Alert and cooperative with normal attention span and concentration. No apparent delusions, illusions, hallucinations      Assessment & Plan:   Urinary tract  infection without hematuria, site unspecified No Follow-up on file.

## 2016-10-24 NOTE — Assessment & Plan Note (Signed)
S/p keflex. UA neg.

## 2016-10-24 NOTE — Patient Instructions (Signed)
Great to see you. Please stop by to see Anna Moore on your way out.   

## 2016-10-25 LAB — URINE CULTURE: ORGANISM ID, BACTERIA: NO GROWTH

## 2016-10-26 ENCOUNTER — Ambulatory Visit (INDEPENDENT_AMBULATORY_CARE_PROVIDER_SITE_OTHER): Payer: No Typology Code available for payment source | Admitting: Internal Medicine

## 2016-10-26 ENCOUNTER — Encounter: Payer: Self-pay | Admitting: Internal Medicine

## 2016-10-26 VITALS — BP 128/70 | HR 88 | Ht 65.5 in | Wt 128.6 lb

## 2016-10-26 DIAGNOSIS — I10 Essential (primary) hypertension: Secondary | ICD-10-CM | POA: Diagnosis not present

## 2016-10-26 DIAGNOSIS — R0602 Shortness of breath: Secondary | ICD-10-CM | POA: Diagnosis not present

## 2016-10-26 DIAGNOSIS — R5383 Other fatigue: Secondary | ICD-10-CM | POA: Diagnosis not present

## 2016-10-26 NOTE — Patient Instructions (Signed)

## 2016-10-26 NOTE — Progress Notes (Signed)
Cardiology Office Note   Date:  10/26/2016   ID:  Anna Moore, Anna Moore Nov 30, 1955, MRN [id number]  PCP:  Lucille Passy, MD  Cardiologist:   Dorris Carnes, MD   Pt referred by Dr Deborra Medina for SOB      History of Present Illness: Anna Moore is a 61 y.o. female with a history of HL, HTN, fibromyalgia, anxiety    Aug 6 felt bad  Sweaty  Sick Fatigued  Upper back pain  Jaw pain  A little chest discomfort Took ambulance to [facility] CT scan done  EKG done   Rx for UTI    Hasnt felt herself ths past year  Tired  Cant exert self  Heart races.   Lipids LDL 131  HDL 74    Last time she felt like she could exercise was end of last yaer  [employer] work this summer   Did not do well. Pt admits that she is anxious     No outpatient prescriptions have been marked as taking for the 10/26/16 encounter (Office Visit) with Fay Records, MD.     Allergies:   Patient has no known allergies.   Past Medical History:  Diagnosis Date  . Anxiety   . Cough    Questionably ACE related  . DJD (degenerative joint disease)    Of the back  . Fatigue    Significant fatigue in the setting of back pain and diaphoresis  . Fatigue   . Forgetfulness   . History of tobacco abuse   . Hot flashes   . Hypertension   . Palpitations     Past Surgical History:  Procedure Laterality Date  . ABDOMINAL HYSTERECTOMY    . BACK SURGERY  01/2008   On L4 and L5  . NASAL SEPTUM SURGERY    . OTHER SURGICAL HISTORY  1998   Hysterectomy  . PAROTIDECTOMY  1990     Social History:  The patient  reports that she quit smoking about 6 months ago. Her smoking use included Cigarettes. She has never used smokeless tobacco. She reports that she drinks about 3.6 oz of alcohol per week . She reports that she does not use drugs.   Family History:  The patient's family history includes Arrhythmia in her mother; Cancer in her father; Heart attack in her father; Heart disease in her father; Hypertension in her father.     ROS:  Please see the history of present illness. All other systems are reviewed and  Negative to the above problem except as noted.    PHYSICAL EXAM: VS:  BP 128/70   Pulse 88   Ht 5' 5.5" (1.664 m)   Wt 128 lb 9.6 oz (58.3 kg)   SpO2 99%   BMI 21.07 kg/m   GEN: Well nourished, well developed, in no acute distress  HEENT: normal  Neck: no JVD, carotid bruits, or masses Cardiac: RRR; no murmurs, rubs, or gallops,no edema  Respiratory:  clear to auscultation bilaterally, normal work of breathing GI: soft, nontender, nondistended, + BS  No hepatomegaly  MS: no deformity Moving all extremities   Skin: warm and dry, no rash Neuro:  Strength and sensation are intact Psych: euthymic mood, full affect   EKG:  EKG is not ordered today.  On 8/6 Sinus tachycardia     Lipid Panel    Component Value Date/Time   CHOL 226 (H) 08/06/2016 0940   TRIG 102.0 08/06/2016 0940   HDL 74.40 08/06/2016 0940  CHOLHDL 3 08/06/2016 0940   VLDL 20.4 08/06/2016 0940   LDLCALC 131 (H) 08/06/2016 0940   LDLDIRECT 127.1 04/30/2012 1130      Wt Readings from Last 3 Encounters:  10/26/16 128 lb 9.6 oz (58.3 kg)  10/24/16 128 lb 12.8 oz (58.4 kg)  10/11/16 129 lb (58.5 kg)      ASSESSMENT AND PLAN:  1  Dyspnea  Chest tightness  Atypical  ON exam no volume overload  Pt had CT scan in ED with on evid for PE  No coronary calcifications   She did have Coronary calcium score in 2016 of 0   On exam no evid of volume overload   Will sched for echo to eval systolic and diastolic properities of heart If normal I would set up for CPX  2  HTN  Adequate control  3  UTI  Urine was concentrated x 2  Encouraged her to increase fluid intake    F/U based on test results  Pt is VERY anxious  This may be contributing/exacerbating symptoms    Current medicines are reviewed at length with the patient today.  The patient does not have concerns regarding medicines.  Signed, Dorris Carnes, MD  10/26/2016  2:57 PM    [facility] [place], [place], [place]  [address] Phone: [phone]; Fax: [phone]

## 2016-10-31 ENCOUNTER — Ambulatory Visit (HOSPITAL_COMMUNITY): Payer: No Typology Code available for payment source | Attending: Cardiology

## 2016-10-31 ENCOUNTER — Other Ambulatory Visit: Payer: Self-pay

## 2016-10-31 DIAGNOSIS — R0602 Shortness of breath: Secondary | ICD-10-CM

## 2016-10-31 DIAGNOSIS — Z87891 Personal history of nicotine dependence: Secondary | ICD-10-CM | POA: Diagnosis not present

## 2016-10-31 DIAGNOSIS — I10 Essential (primary) hypertension: Secondary | ICD-10-CM | POA: Insufficient documentation

## 2016-11-02 ENCOUNTER — Other Ambulatory Visit: Payer: Self-pay | Admitting: *Deleted

## 2016-11-02 DIAGNOSIS — R0609 Other forms of dyspnea: Secondary | ICD-10-CM

## 2017-01-31 ENCOUNTER — Other Ambulatory Visit: Payer: Self-pay | Admitting: Family Medicine

## 2017-02-04 ENCOUNTER — Telehealth: Payer: Self-pay | Admitting: Family Medicine

## 2017-02-04 MED ORDER — ZOSTER VAC RECOMB ADJUVANTED 50 MCG/0.5ML IM SUSR: 0.5000 mL | Freq: Once | INTRAMUSCULAR | 0 refills | Status: AC

## 2017-02-04 NOTE — Telephone Encounter (Signed)
Copied from [place]. Topic: Quick Communication - See Telephone Encounter >> Feb 04, 2017  8:46 AM Cleaster Corin, NT wrote: CRM for notification. See Telephone encounter for:   02/04/17. Pt. Called to see is she is able to get shingles shot at pharmacy or if she has to come into office. Pt. Stated that Dr. Deborra Medina told her to call later in the year to ask about vaccine due to shortage. Made appt. A week out just in case pt. Needs to come into office for shot can cancel appt. If she can get rx, to get vaccine at [facility]. Pt. Would also like to Dr. Deborra Medina know that the rx. For [place] and xanax did work and she would like to have refills of both. Please give give pt. A call with any questions.   [facility] ([address]), [place] - [address] [place] ([place]) [place] [address] Phone: [phone] Fax: [phone] Not a 24 hour pharmacy; exact hours not known

## 2017-02-04 NOTE — Telephone Encounter (Signed)
Pt aware/thx dmf 

## 2017-02-04 NOTE — Telephone Encounter (Signed)
eRx sent as requested. 

## 2017-02-04 NOTE — Telephone Encounter (Signed)
TA-Patient is requesting order for Shingrix to be faxed to pharmacy/plz advise/thx dmf

## 2017-02-12 ENCOUNTER — Ambulatory Visit: Payer: No Typology Code available for payment source

## 2017-05-28 ENCOUNTER — Encounter: Payer: Self-pay | Admitting: Family Medicine

## 2017-05-28 ENCOUNTER — Ambulatory Visit (INDEPENDENT_AMBULATORY_CARE_PROVIDER_SITE_OTHER): Payer: No Typology Code available for payment source | Admitting: Family Medicine

## 2017-05-28 VITALS — BP 144/86 | HR 111 | Temp 98.4°F | Ht 65.5 in | Wt 137.0 lb

## 2017-05-28 DIAGNOSIS — J019 Acute sinusitis, unspecified: Secondary | ICD-10-CM | POA: Insufficient documentation

## 2017-05-28 DIAGNOSIS — N390 Urinary tract infection, site not specified: Secondary | ICD-10-CM | POA: Diagnosis not present

## 2017-05-28 DIAGNOSIS — J018 Other acute sinusitis: Secondary | ICD-10-CM | POA: Diagnosis not present

## 2017-05-28 DIAGNOSIS — R300 Dysuria: Secondary | ICD-10-CM | POA: Diagnosis not present

## 2017-05-28 LAB — POCT URINALYSIS DIPSTICK
BILIRUBIN UA: NEGATIVE
Blood, UA: NEGATIVE
GLUCOSE UA: NEGATIVE
Ketones, UA: NEGATIVE
Leukocytes, UA: NEGATIVE
Nitrite, UA: POSITIVE
PH UA: 7 (ref 5.0–8.0)
Protein, UA: 15
Spec Grav, UA: 1.02 (ref 1.010–1.025)
UROBILINOGEN UA: 0.2 U/dL

## 2017-05-28 MED ORDER — ALPRAZOLAM 0.25 MG PO TABS: 0.2500 mg | ORAL_TABLET | Freq: Two times a day (BID) | ORAL | 2 refills | Status: DC | PRN

## 2017-05-28 MED ORDER — CEPHALEXIN 500 MG PO CAPS: 500.0000 mg | ORAL_CAPSULE | Freq: Two times a day (BID) | ORAL | 0 refills | Status: DC

## 2017-05-28 NOTE — Progress Notes (Signed)
Subjective:   Patient ID: Anna Moore, female    DOB: [birth year], 62 y.o.   MRN: [id number]  Anna Moore is a pleasant 62 y.o. year old female who presents to clinic today with Dysuria (Patient is here today C/O Dysuria x1wk.  Hurts all the way up in her upper abd.  ) and Sinusitis (Patient is also C/O a possible Sinus Inf.  She has been Tx with Coricedin but has not been very helpful.  She has right temporal pressure with AD pain.  Cough that is non productive.  Nasal mucous has been clear.)  on 05/28/2017  HPI:  ?UTI- dysuria x 1 week.  ?sinusitis- right frontal/temporal pressure and ear pain x 1 week.  Cough is non productive. No SOB. No fevers or chills.   Current Outpatient Medications on File Prior to Visit  Medication Sig Dispense Refill  . acetaminophen (TYLENOL) 500 MG tablet Take 500 mg by mouth every 6 (six) hours as needed (pain, fever).    Marland Kitchen amLODipine (NORVASC) 10 MG tablet TAKE ONE TABLET BY MOUTH ONCE DAILY 30 tablet 11  . estradiol (ESTRACE) 1 MG tablet TAKE ONE TABLET BY MOUTH TWICE DAILY 180 tablet 3  . hydrochlorothiazide (MICROZIDE) 12.5 MG capsule TAKE ONE CAPSULE BY MOUTH ONCE DAILY 30 capsule 11  . methimazole (TAPAZOLE) 5 MG tablet Take 1 tablet (5 mg total) by mouth 3 (three) times a week. 13 tablet 5  . zolpidem (AMBIEN) 5 MG tablet TAKE 1 TABLET BY MOUTH AT BEDTIME AS NEEDED FOR SLEEP 30 tablet 0   No current facility-administered medications on file prior to visit.     No Known Allergies  Past Medical History:  Diagnosis Date  . Anxiety   . Cough    Questionably ACE related  . DJD (degenerative joint disease)    Of the back  . Fatigue    Significant fatigue in the setting of back pain and diaphoresis  . Fatigue   . Forgetfulness   . History of tobacco abuse   . Hot flashes   . Hypertension   . Palpitations     Past Surgical History:  Procedure Laterality Date  . ABDOMINAL HYSTERECTOMY    . BACK SURGERY  01/2008   On L4 and L5    . NASAL SEPTUM SURGERY    . OTHER SURGICAL HISTORY  1998   Hysterectomy  . PAROTIDECTOMY  1990    Family History  Problem Relation Age of Onset  . Arrhythmia Mother   . Hypertension Father   . Heart disease Father   . Heart attack Father        26's  . Cancer Father        non hodgkins lymphoma  . Colon cancer Neg Hx   . Thyroid disease Neg Hx     Social History   Socioeconomic History  . Marital status: Married    Spouse name: Not on file  . Number of children: Not on file  . Years of education: Not on file  . Highest education level: Not on file  Occupational History  . Not on file  Social Needs  . Financial resource strain: Not on file  . Food insecurity:    Worry: Not on file    Inability: Not on file  . Transportation needs:    Medical: Not on file    Non-medical: Not on file  Tobacco Use  . Smoking status: Former Smoker    Types: Cigarettes    Last attempt  to quit: 04/08/2016    Years since quitting: 1.1  . Smokeless tobacco: Never Used  Substance and Sexual Activity  . Alcohol use: Yes    Alcohol/week: 3.6 oz    Types: 6 Cans of beer per week    Comment: 5 beers a week  . Drug use: No  . Sexual activity: Not on file  Lifestyle  . Physical activity:    Days per week: Not on file    Minutes per session: Not on file  . Stress: Not on file  Relationships  . Social connections:    Talks on phone: Not on file    Gets together: Not on file    Attends religious service: Not on file    Active member of club or organization: Not on file    Attends meetings of clubs or organizations: Not on file    Relationship status: Not on file  . Intimate partner violence:    Fear of current or ex partner: Not on file    Emotionally abused: Not on file    Physically abused: Not on file    Forced sexual activity: Not on file  Other Topics Concern  . Not on file  Social History Narrative   Married.  One daughter, 55 yo- moving back in with her.   Retired Tax adviser.   The PMH, PSH, Social History, Family History, Medications, and allergies have been reviewed in [facility], and have been updated if relevant.   Review of Systems  Constitutional: Negative.   HENT: Positive for congestion, postnasal drip, rhinorrhea, sinus pressure, sinus pain and sore throat.   Eyes: Negative.   Respiratory: Positive for cough. Negative for shortness of breath, wheezing and stridor.   Endocrine: Negative.   Genitourinary: Positive for dysuria, frequency and urgency. Negative for decreased urine volume, difficulty urinating, dyspareunia, flank pain, genital sores, hematuria, menstrual problem, pelvic pain, vaginal bleeding, vaginal discharge and vaginal pain.  Musculoskeletal: Negative.   Allergic/Immunologic: Negative.   Neurological: Negative.   Hematological: Negative.   Psychiatric/Behavioral: Negative.   All other systems reviewed and are negative.      Objective:    BP (!) 144/86 (BP Location: Left Arm, Patient Position: Sitting, Cuff Size: Normal)   Pulse (!) 111   Temp 98.4 F (36.9 C) (Oral)   Ht 5' 5.5" (1.664 m)   Wt 137 lb (62.1 kg)   SpO2 97%   BMI 22.45 kg/m    Physical Exam  Constitutional: She is oriented to person, place, and time. She appears well-developed and well-nourished. No distress.  HENT:  Head: Normocephalic.  Right Ear: Hearing and tympanic membrane normal.  Left Ear: Hearing and tympanic membrane normal.  Nose: Right sinus exhibits frontal sinus tenderness. Left sinus exhibits frontal sinus tenderness.  Mouth/Throat: Uvula is midline and oropharynx is clear and moist.  Cardiovascular: Normal rate.  Pulmonary/Chest: Effort normal and breath sounds normal.  Abdominal: Normal appearance. There is no hepatosplenomegaly. There is no tenderness. There is no rebound and no CVA tenderness.  Musculoskeletal: Normal range of motion.  Neurological: She is alert and oriented to person, place, and time. No cranial nerve deficit.  Skin:  Skin is warm and dry. She is not diaphoretic.  Psychiatric: She has a normal mood and affect. Her behavior is normal. Judgment and thought content normal.  Nursing note and vitals reviewed.         Assessment & Plan:   Dysuria - Plan: POCT urinalysis dipstick, Urine Culture  Urinary tract  infection without hematuria, site unspecified  Other acute sinusitis, recurrence not specified No follow-ups on file.

## 2017-05-28 NOTE — Patient Instructions (Signed)

## 2017-05-28 NOTE — Assessment & Plan Note (Signed)
UA pos- treat with Keflex 500 mg twice daily x 7 days, also cover URI pathogens. Call or return to clinic prn if these symptoms worsen or fail to improve as anticipated.  The patient indicates understanding of these issues and agrees with the plan.

## 2017-05-28 NOTE — Progress Notes (Signed)
Sent for C&S/thx dmf 

## 2017-05-28 NOTE — Assessment & Plan Note (Signed)
See below

## 2017-05-30 LAB — URINE CULTURE
MICRO NUMBER:: 90376205
SPECIMEN QUALITY:: ADEQUATE

## 2017-06-06 ENCOUNTER — Telehealth: Payer: Self-pay | Admitting: Family Medicine

## 2017-06-06 MED ORDER — FLUCONAZOLE 150 MG PO TABS: 150.0000 mg | ORAL_TABLET | Freq: Once | ORAL | 0 refills | Status: AC

## 2017-06-06 NOTE — Telephone Encounter (Signed)
Copied from [place] [id number]. Topic: Quick Communication - See Telephone Encounter >> Jun 06, 2017  8:08 AM Conception Chancy, NT wrote: CRM for notification. See Telephone encounter for: 06/06/17.  Patient is calling today and states she had a UTI and Dr. Deborra Medina put her on cephALEXin (KEFLEX) 500 MG capsule  she states now she has a yeast infection from the medication and would like to know can something be called in for this.   [facility] ([address] [place] - [address] [place] ([place]) [place] [address] Phone: [phone] Fax: [phone]

## 2017-06-06 NOTE — Telephone Encounter (Signed)
Per TA ok for diflucan 150mg  #1/sent to pharm/pt aware/thx dmf

## 2017-07-16 ENCOUNTER — Telehealth: Payer: Self-pay

## 2017-07-16 ENCOUNTER — Other Ambulatory Visit: Payer: Self-pay | Admitting: Family Medicine

## 2017-07-16 NOTE — Telephone Encounter (Signed)
Spoke with pt and informed she can do lab work the same day as her CPE on 6.5.19. TLGCopied from [place] [phone]. Topic: General - Other >> Jul 16, 2017 10:34 AM Yvette Rack wrote: Reason for CRM: pt has a CPE with Deborra Medina on 08-07-17 but she would like top have blood work done the time of physical

## 2017-07-30 ENCOUNTER — Ambulatory Visit: Payer: Self-pay | Admitting: *Deleted

## 2017-07-30 NOTE — Telephone Encounter (Signed)
Pt is at the [facility] and she states that she is having the same symptoms that she had last year for a UTI shaky chills smelly dark urine nausea vomiting some back pain she never had burning when she urinate; recommendations made per nurse triage protocol to include seeing a physician within 24 hours; the pt would like for something be called in for her; spoke with Lavella Lemons at [facility] and she states that the pt needs to be seen in urgent care before medication can be called in; pt given information and she states that she will proceed to urgent care; will route to office for notification of this encounter.  Reason for Disposition . Bad or foul-smelling urine  Answer Assessment - Initial Assessment Questions 1. SYMPTOM: "What's the main symptom you're concerned about?" (e.g., frequency, incontinence)     Frequency, dark urine 2. ONSET: "When did the  ________  start?"     07/28/17 3. PAIN: "Is there any pain?" If so, ask: "How bad is it?" (Scale: 1-10; mild, moderate, severe)     moderate 4. CAUSE: "What do you think is causing the symptoms?"     UTI 5. OTHER SYMPTOMS: "Do you have any other symptoms?" (e.g., fever, flank pain, blood in urine, pain with urination)     Nausea, chills, sweating 6. PREGNANCY: "Is there any chance you are pregnant?" "When was your last menstrual period?"     No hysterectomy  Protocols used: URINARY [facility]

## 2017-08-07 ENCOUNTER — Encounter: Payer: Self-pay | Admitting: Family Medicine

## 2017-08-07 ENCOUNTER — Ambulatory Visit (INDEPENDENT_AMBULATORY_CARE_PROVIDER_SITE_OTHER): Payer: No Typology Code available for payment source | Admitting: Family Medicine

## 2017-08-07 VITALS — BP 134/86 | HR 94 | Temp 98.3°F | Ht 65.5 in | Wt 138.0 lb

## 2017-08-07 DIAGNOSIS — K635 Polyp of colon: Secondary | ICD-10-CM

## 2017-08-07 DIAGNOSIS — Z23 Encounter for immunization: Secondary | ICD-10-CM | POA: Diagnosis not present

## 2017-08-07 DIAGNOSIS — E0590 Thyrotoxicosis, unspecified without thyrotoxic crisis or storm: Secondary | ICD-10-CM

## 2017-08-07 DIAGNOSIS — Z1231 Encounter for screening mammogram for malignant neoplasm of breast: Secondary | ICD-10-CM

## 2017-08-07 DIAGNOSIS — I10 Essential (primary) hypertension: Secondary | ICD-10-CM | POA: Diagnosis not present

## 2017-08-07 DIAGNOSIS — E785 Hyperlipidemia, unspecified: Secondary | ICD-10-CM

## 2017-08-07 DIAGNOSIS — B9689 Other specified bacterial agents as the cause of diseases classified elsewhere: Secondary | ICD-10-CM | POA: Insufficient documentation

## 2017-08-07 DIAGNOSIS — Z0000 Encounter for general adult medical examination without abnormal findings: Secondary | ICD-10-CM

## 2017-08-07 DIAGNOSIS — Z1211 Encounter for screening for malignant neoplasm of colon: Secondary | ICD-10-CM

## 2017-08-07 DIAGNOSIS — Z1239 Encounter for other screening for malignant neoplasm of breast: Secondary | ICD-10-CM

## 2017-08-07 DIAGNOSIS — N760 Acute vaginitis: Secondary | ICD-10-CM

## 2017-08-07 DIAGNOSIS — R739 Hyperglycemia, unspecified: Secondary | ICD-10-CM

## 2017-08-07 LAB — POCT GLYCOSYLATED HEMOGLOBIN (HGB A1C): HEMOGLOBIN A1C: 5.7 % — AB (ref 4.0–5.6)

## 2017-08-07 LAB — LIPID PANEL
CHOLESTEROL: 203 mg/dL — AB (ref 0–200)
HDL: 76.5 mg/dL (ref >39.00)
LDL CALC: 112 mg/dL — AB (ref 0–99)
NONHDL: 126.23
Total CHOL/HDL Ratio: 3
Triglycerides: 70 mg/dL (ref 0.0–149.0)
VLDL: 14 mg/dL (ref 0.0–40.0)

## 2017-08-07 LAB — COMPREHENSIVE METABOLIC PANEL
ALK PHOS: 61 U/L (ref 39–117)
ALT: 14 U/L (ref 0–35)
AST: 14 U/L (ref 0–37)
Albumin: 4.7 g/dL (ref 3.5–5.2)
BUN: 16 mg/dL (ref 6–23)
CHLORIDE: 100 meq/L (ref 96–112)
CO2: 30 mEq/L (ref 19–32)
Calcium: 10 mg/dL (ref 8.4–10.5)
Creatinine, Ser: 0.56 mg/dL (ref 0.40–1.20)
GFR: 116.42 mL/min (ref >60.00)
GLUCOSE: 113 mg/dL — AB (ref 70–99)
POTASSIUM: 3.4 meq/L — AB (ref 3.5–5.1)
SODIUM: 138 meq/L (ref 135–145)
TOTAL PROTEIN: 6.7 g/dL (ref 6.0–8.3)
Total Bilirubin: 0.4 mg/dL (ref 0.2–1.2)

## 2017-08-07 LAB — T4, FREE: Free T4: 1.05 ng/dL (ref 0.60–1.60)

## 2017-08-07 LAB — TSH: TSH: 0.28 u[IU]/mL — ABNORMAL LOW (ref 0.35–4.50)

## 2017-08-07 MED ORDER — ESTRADIOL 1 MG PO TABS: 1.0000 mg | ORAL_TABLET | Freq: Two times a day (BID) | ORAL | 3 refills | Status: DC

## 2017-08-07 MED ORDER — AMLODIPINE BESYLATE 10 MG PO TABS: 10.0000 mg | ORAL_TABLET | Freq: Every day | ORAL | 11 refills | Status: DC

## 2017-08-07 MED ORDER — ZOLPIDEM TARTRATE 5 MG PO TABS: 5.0000 mg | ORAL_TABLET | Freq: Every evening | ORAL | 5 refills | Status: DC | PRN

## 2017-08-07 MED ORDER — ALPRAZOLAM 0.25 MG PO TABS: 0.2500 mg | ORAL_TABLET | Freq: Two times a day (BID) | ORAL | 5 refills | Status: DC | PRN

## 2017-08-07 MED ORDER — HYDROCHLOROTHIAZIDE 25 MG PO TABS: 25.0000 mg | ORAL_TABLET | Freq: Every day | ORAL | 1 refills | Status: DC

## 2017-08-07 NOTE — Progress Notes (Signed)
Subjective:   Patient ID: Anna Moore, female    DOB: 1955/09/22, 62 y.o.   MRN: [id number]  Anna Moore is a pleasant 62 y.o. year old female who presents to clinic today with Annual Exam (Patient is here today for a CPE.  She is currently fasting.  She went to an UC in [place] for a possible UTI.  She did not have a UTI but was positive for BV and Tx with Metronidazole.  They found a growth in the left side of her vagina and said that it could be swollen gland but to mention it. Her BP was high there so they increased her HCTZ to 25mg  and it is working well and home readings are good.  They were concerned that her BS non-fasting was 141.  She agrees to get Tdap today. ) and addendum (She has the paper to call and have her Mammogram done and she knows she was due in April. Unsure if needs to go to GI for Colonoscopy due to H/O Polyps or if she can do the Cologuard.)  on 08/07/2017  HPI:  Due for mammogram and colonoscopy (due to polyps). Remote h/o hysterectomy.  Health Maintenance  Topic Date Due  . HIV Screening  03/03/1970  . TETANUS/TDAP  03/03/1974  . COLONOSCOPY  06/03/2015  . INFLUENZA VACCINE  01/28/2018 (Originally 10/03/2017)  . MAMMOGRAM  06/20/2018  . Hepatitis C Screening  Completed   HTN- HCTZ was increased to 25 mg daily while she was at [facility].  Still also taking Norvasc 10 mg daily.  Checking BP at home and has not been running too low- no symptoms of hypotension.  Went to UC for dysuria [place]. Told she had BV- still taking Flaygl for this.  Was told she had a ? Growth or lymph node on the left side of her vagina.  Symptoms are improving but still there- never had dysuria or vaginal discharge.  Had pressure and some nausea.  Was also told that her fasting blood sugar at urgent care was 141.  Denies increased thirst or urination.  Hyperthyroidism- taking Methimazole. Lab Results  Component Value Date   TSH 0.31 (L) 08/06/2016    Current  Outpatient Medications on File Prior to Visit  Medication Sig Dispense Refill  . acetaminophen (TYLENOL) 500 MG tablet Take 500 mg by mouth every 6 (six) hours as needed (pain, fever).    . ALPRAZolam (XANAX) 0.25 MG tablet Take 1 tablet (0.25 mg total) by mouth 2 (two) times daily as needed. for anxiety 30 tablet 2  . amLODipine (NORVASC) 10 MG tablet TAKE ONE TABLET BY MOUTH ONCE DAILY 30 tablet 11  . estradiol (ESTRACE) 1 MG tablet TAKE ONE TABLET BY MOUTH TWICE DAILY 180 tablet 3  . hydrochlorothiazide (HYDRODIURIL) 25 MG tablet Take 1 tablet by mouth daily.  1  . methimazole (TAPAZOLE) 5 MG tablet TAKE 1 TABLET BY MOUTH THREE TIMES A WEEK 13 tablet 0  . metroNIDAZOLE (FLAGYL) 500 MG tablet Take 500 mg by mouth 2 (two) times daily.  0  . zolpidem (AMBIEN) 5 MG tablet TAKE 1 TABLET BY MOUTH AT BEDTIME AS NEEDED FOR SLEEP 30 tablet 0   No current facility-administered medications on file prior to visit.     No Known Allergies  Past Medical History:  Diagnosis Date  . Anxiety   . Cough    Questionably ACE related  . DJD (degenerative joint disease)    Of the back  .  Fatigue    Significant fatigue in the setting of back pain and diaphoresis  . Fatigue   . Forgetfulness   . History of tobacco abuse   . Hot flashes   . Hypertension   . Palpitations     Past Surgical History:  Procedure Laterality Date  . ABDOMINAL HYSTERECTOMY    . BACK SURGERY  01/2008   On L4 and L5  . NASAL SEPTUM SURGERY    . OTHER SURGICAL HISTORY  1998   Hysterectomy  . PAROTIDECTOMY  1990    Family History  Problem Relation Age of Onset  . Arrhythmia Mother   . Hypertension Father   . Heart disease Father   . Heart attack Father        59's  . Cancer Father        non hodgkins lymphoma  . Colon cancer Neg Hx   . Thyroid disease Neg Hx     Social History   Socioeconomic History  . Marital status: Married    Spouse name: Not on file  . Number of children: Not on file  . Years of  education: Not on file  . Highest education level: Not on file  Occupational History  . Not on file  Social Needs  . Financial resource strain: Not on file  . Food insecurity:    Worry: Not on file    Inability: Not on file  . Transportation needs:    Medical: Not on file    Non-medical: Not on file  Tobacco Use  . Smoking status: Former Smoker    Types: Cigarettes    Last attempt to quit: 04/08/2016    Years since quitting: 1.3  . Smokeless tobacco: Never Used  Substance and Sexual Activity  . Alcohol use: Yes    Alcohol/week: 3.6 oz    Types: 6 Cans of beer per week    Comment: 5 beers a week  . Drug use: No  . Sexual activity: Not on file  Lifestyle  . Physical activity:    Days per week: Not on file    Minutes per session: Not on file  . Stress: Not on file  Relationships  . Social connections:    Talks on phone: Not on file    Gets together: Not on file    Attends religious service: Not on file    Active member of club or organization: Not on file    Attends meetings of clubs or organizations: Not on file    Relationship status: Not on file  . Intimate partner violence:    Fear of current or ex partner: Not on file    Emotionally abused: Not on file    Physically abused: Not on file    Forced sexual activity: Not on file  Other Topics Concern  . Not on file  Social History Narrative   Married.  One daughter, 26 yo- moving back in with her.   Retired Medical illustrator.   The PMH, PSH, Social History, Family History, Medications, and allergies have been reviewed in [facility], and have been updated if relevant.    Review of Systems  Constitutional: Negative.   HENT: Negative.   Eyes: Negative.   Respiratory: Negative.   Cardiovascular: Negative.   Gastrointestinal: Negative.   Endocrine: Negative.   Genitourinary: Negative.   Musculoskeletal: Negative.   Skin: Negative.   Allergic/Immunologic: Negative.   Neurological: Negative.   Hematological: Negative.     Psychiatric/Behavioral: Negative.   All other  systems reviewed and are negative.      Objective:    BP 134/86 (BP Location: Left Arm, Patient Position: Sitting, Cuff Size: Normal)   Pulse 94   Temp 98.3 F (36.8 C) (Oral)   Ht 5' 5.5" (1.664 m)   Wt 138 lb (62.6 kg)   SpO2 96%   BMI 22.62 kg/m    Physical Exam   General:  Well-developed,well-nourished,in no acute distress; alert,appropriate and cooperative throughout examination Head:  normocephalic and atraumatic.   Eyes:  vision grossly intact, PERRL Ears:  R ear normal and L ear normal externally, TMs clear bilaterally Nose:  no external deformity.   Mouth:  good dentition.   Neck:  No deformities, masses, or tenderness noted. Breasts:  No mass, nodules, thickening, tenderness, bulging, retraction, inflamation, nipple discharge or skin changes noted.   Lungs:  Normal respiratory effort, chest expands symmetrically. Lungs are clear to auscultation, no crackles or wheezes. Heart:  Normal rate and regular rhythm. S1 and S2 normal without gallop, murmur, click, rub or other extra sounds. Abdomen:  Bowel sounds positive,abdomen soft and non-tender without masses, organomegaly or hernias noted. Msk:  No deformity or scoliosis noted of thoracic or lumbar spine.   Extremities:  No clubbing, cyanosis, edema, or deformity noted with normal full range of motion of all joints.   Neurologic:  alert & oriented X3 and gait normal.   Skin:  Intact without suspicious lesions or rashes Cervical Nodes:  No lymphadenopathy noted Axillary Nodes:  No palpable lymphadenopathy Psych:  Cognition and judgment appear intact. Alert and cooperative with normal attention span and concentration. No apparent delusions, illusions, hallucinations       Assessment & Plan:   Routine general medical examination at a health care facility  Need for Tdap vaccination - Plan: Tdap vaccine greater than or equal to 7yo IM  Essential  hypertension  Hyperlipidemia, unspecified hyperlipidemia type  Hyperthyroidism  Hyperglycemia  Polyp of colon, unspecified part of colon, unspecified type - Plan: Ambulatory referral to Gastroenterology  Screening for malignant neoplasm of colon - Plan: Ambulatory referral to Gastroenterology  Screening for breast cancer - Plan: MM Digital Screening No follow-ups on file.

## 2017-08-07 NOTE — Assessment & Plan Note (Signed)
POC a1c today is 5.7 and I will be checking a fasting glucose today with her CMET.  Reassurance provided.

## 2017-08-07 NOTE — Assessment & Plan Note (Signed)
Refer for colonoscopy, she is due for screening.

## 2017-08-07 NOTE — Assessment & Plan Note (Signed)
Continue current rx. Check thyroid labs today.

## 2017-08-07 NOTE — Assessment & Plan Note (Signed)
Reviewed preventive care protocols, scheduled due services, and updated immunizations Discussed nutrition, exercise, diet, and healthy lifestyle.  Orders Placed This Encounter  Procedures  . MM Digital Screening  . Tdap vaccine greater than or equal to 63yo IM  . Comprehensive metabolic panel  . Lipid panel  . TSH  . T4, free  . T3  . Ambulatory referral to Gastroenterology  . POCT HgB A1C

## 2017-08-07 NOTE — Assessment & Plan Note (Signed)
Well controlled with increased dose of HCTZ. Continue current rxs.

## 2017-08-07 NOTE — Assessment & Plan Note (Signed)
Diagnosed at [facility] in [facility].  Finish course of Flagyl.  She will return in a few weeks for a vaginal exam to I can evaluate the lymph node that was discovered at [facility]. The patient indicates understanding of these issues and agrees with the plan.

## 2017-08-07 NOTE — Patient Instructions (Signed)
Great to see you. I will call you with your lab results from today and you can view them online.   Please come see me after your trip.  Have a wonderful trip.  Please schedule your mammogram.

## 2017-08-08 LAB — T3: T3, Total: 105 ng/dL (ref 76–181)

## 2017-08-12 ENCOUNTER — Other Ambulatory Visit: Payer: Self-pay | Admitting: Family Medicine

## 2017-08-14 MED ORDER — METHIMAZOLE 5 MG PO TABS: ORAL_TABLET | ORAL | 1 refills | Status: DC

## 2017-08-14 NOTE — Telephone Encounter (Signed)
rx faxed to [facility] fail.

## 2017-09-26 ENCOUNTER — Encounter: Payer: Self-pay | Admitting: Family Medicine

## 2018-01-29 ENCOUNTER — Other Ambulatory Visit: Payer: Self-pay | Admitting: *Deleted

## 2018-01-29 MED ORDER — METHIMAZOLE 5 MG PO TABS: ORAL_TABLET | ORAL | 1 refills | Status: DC

## 2018-02-05 ENCOUNTER — Other Ambulatory Visit: Payer: Self-pay | Admitting: Family Medicine

## 2018-02-05 MED ORDER — METHIMAZOLE 5 MG PO TABS: ORAL_TABLET | ORAL | 1 refills | Status: DC

## 2018-02-05 NOTE — Telephone Encounter (Signed)
Can this Rx be resent- it looks like it was marked as print and it was not received by the pharmacy.

## 2018-02-05 NOTE — Telephone Encounter (Signed)
Copied from [place] [id number]. Topic: Quick Communication - Rx Refill/Question >> Feb 05, 2018 11:22 AM Yvette Rack wrote: Medication: methimazole (TAPAZOLE) 5 MG tablet Pt had called the office last week and Monday still haven't heard from provider pt is out of medicine she took last dose yesterday  Has the patient contacted their pharmacy? Yes.   (Agent: If no, request that the patient contact the pharmacy for the refill.) (Agent: If yes, when and what did the pharmacy advise?) to call provider that they haven't heard from provider  Preferred Pharmacy (with phone number or street name): [facility] ([address]  - [place] [phone] (Phone) [phone] (Fax)    Agent: Please be advised that RX refills may take up to 3 business days. We ask that you follow-up with your pharmacy.

## 2018-02-10 ENCOUNTER — Other Ambulatory Visit: Payer: Self-pay | Admitting: Family Medicine

## 2018-04-18 ENCOUNTER — Other Ambulatory Visit (HOSPITAL_COMMUNITY)
Admission: RE | Admit: 2018-04-18 | Discharge: 2018-04-18 | Disposition: A | Discharge status: Home / Self Care | Payer: No Typology Code available for payment source | Source: Ambulatory Visit | Attending: Nurse Practitioner | Admitting: Nurse Practitioner

## 2018-04-18 ENCOUNTER — Ambulatory Visit (INDEPENDENT_AMBULATORY_CARE_PROVIDER_SITE_OTHER): Payer: No Typology Code available for payment source | Admitting: Nurse Practitioner

## 2018-04-18 ENCOUNTER — Encounter: Payer: Self-pay | Admitting: Nurse Practitioner

## 2018-04-18 VITALS — BP 150/86 | HR 96 | Temp 97.9°F | Ht 65.5 in | Wt 140.0 lb

## 2018-04-18 DIAGNOSIS — Z23 Encounter for immunization: Secondary | ICD-10-CM | POA: Diagnosis not present

## 2018-04-18 DIAGNOSIS — R1032 Left lower quadrant pain: Secondary | ICD-10-CM | POA: Diagnosis not present

## 2018-04-18 DIAGNOSIS — R110 Nausea: Secondary | ICD-10-CM

## 2018-04-18 DIAGNOSIS — N898 Other specified noninflammatory disorders of vagina: Secondary | ICD-10-CM

## 2018-04-18 DIAGNOSIS — K529 Noninfective gastroenteritis and colitis, unspecified: Secondary | ICD-10-CM | POA: Diagnosis not present

## 2018-04-18 DIAGNOSIS — N3000 Acute cystitis without hematuria: Secondary | ICD-10-CM

## 2018-04-18 LAB — CBC WITH DIFFERENTIAL/PLATELET
Basophils Absolute: 0.1 10*3/uL (ref 0.0–0.1)
Basophils Relative: 0.4 % (ref 0.0–3.0)
Eosinophils Absolute: 0.1 10*3/uL (ref 0.0–0.7)
Eosinophils Relative: 0.6 % (ref 0.0–5.0)
HCT: 45.1 % (ref 36.0–46.0)
Hemoglobin: 15 g/dL (ref 12.0–15.0)
Lymphocytes Relative: 17.8 % (ref 12.0–46.0)
Lymphs Abs: 2.2 10*3/uL (ref 0.7–4.0)
MCHC: 33.2 g/dL (ref 30.0–36.0)
MCV: 93.3 fl (ref 78.0–100.0)
Monocytes Absolute: 0.9 10*3/uL (ref 0.1–1.0)
Monocytes Relative: 7.1 % (ref 3.0–12.0)
NEUTROS PCT: 74.1 % (ref 43.0–77.0)
Neutro Abs: 9 10*3/uL — ABNORMAL HIGH (ref 1.4–7.7)
Platelets: 372 10*3/uL (ref 150.0–400.0)
RBC: 4.84 Mil/uL (ref 3.87–5.11)
RDW: 13.8 % (ref 11.5–15.5)
WBC: 12.2 10*3/uL — ABNORMAL HIGH (ref 4.0–10.5)

## 2018-04-18 LAB — BASIC METABOLIC PANEL
BUN: 17 mg/dL (ref 6–23)
CHLORIDE: 98 meq/L (ref 96–112)
CO2: 29 mEq/L (ref 19–32)
Calcium: 10.2 mg/dL (ref 8.4–10.5)
Creatinine, Ser: 0.68 mg/dL (ref 0.40–1.20)
GFR: 87.35 mL/min (ref >60.00)
Glucose, Bld: 107 mg/dL — ABNORMAL HIGH (ref 70–99)
Potassium: 3.5 mEq/L (ref 3.5–5.1)
Sodium: 137 mEq/L (ref 135–145)

## 2018-04-18 LAB — POCT URINALYSIS DIPSTICK
Bilirubin, UA: NEGATIVE
Blood, UA: NEGATIVE
Glucose, UA: NEGATIVE
KETONES UA: NEGATIVE
Leukocytes, UA: NEGATIVE
NITRITE UA: NEGATIVE
PH UA: 7 (ref 5.0–8.0)
PROTEIN UA: NEGATIVE
Spec Grav, UA: 1.01 (ref 1.010–1.025)
UROBILINOGEN UA: 0.2 U/dL

## 2018-04-18 MED ORDER — CIPROFLOXACIN HCL 250 MG PO TABS: 250.0000 mg | ORAL_TABLET | Freq: Two times a day (BID) | ORAL | 0 refills | Status: DC

## 2018-04-18 NOTE — Progress Notes (Addendum)
Subjective:  Patient ID: Anna Moore, female    DOB: 09-02-1955  Age: 63 y.o. MRN: [id number]  CC: Back Pain (pt c/o upper back pain that radiates down left lower side/back , pt aslo is having some urinary frquency and stomach bloating with slight vaginal itching )  Abdominal Pain  This is a new problem. The current episode started in the past 7 days. The onset quality is gradual. The problem occurs constantly. The problem has been unchanged. The pain is located in the suprapubic region and LLQ. The quality of the pain is colicky, a sensation of fullness and dull. The abdominal pain radiates to the back. Associated symptoms include anorexia, frequency, myalgias and nausea. Pertinent negatives include no belching, constipation, diarrhea, dysuria, headaches, hematochezia, hematuria, melena, vomiting or weight loss. Associated symptoms comments: chills. Nothing aggravates the pain. The pain is relieved by nothing. She has tried nothing for the symptoms. There is no history of colon cancer, gallstones, GERD or PUD.   Colonoscopy done several years ago, polyps removed.normal per patient  Resolved vaginal itching last week. She will still like vaginal swab. Declined GC/chlamydia and trich testing.  Reviewed past Medical, Social and Family history today.  Outpatient Medications Prior to Visit  Medication Sig Dispense Refill  . acetaminophen (TYLENOL) 500 MG tablet Take 500 mg by mouth every 6 (six) hours as needed (pain, fever).    . ALPRAZolam (XANAX) 0.25 MG tablet Take 1 tablet (0.25 mg total) by mouth 2 (two) times daily as needed. for anxiety 30 tablet 5  . amLODipine (NORVASC) 10 MG tablet Take 1 tablet (10 mg total) by mouth daily. 30 tablet 11  . estradiol (ESTRACE) 1 MG tablet Take 1 tablet (1 mg total) by mouth 2 (two) times daily. 180 tablet 3  . hydrochlorothiazide (HYDRODIURIL) 25 MG tablet TAKE 1 TABLET BY MOUTH ONCE DAILY 90 tablet 1  . methimazole (TAPAZOLE) 5 MG tablet TAKE 1  TABLET BY MOUTH THREE TIMES A WEEK 90 tablet 1  . zolpidem (AMBIEN) 5 MG tablet Take 1 tablet (5 mg total) by mouth at bedtime as needed. for sleep 30 tablet 5   No facility-administered medications prior to visit.     ROS See HPI  Objective:  BP (!) 150/86   Pulse 96   Temp 97.9 F (36.6 C) (Oral)   Ht 5' 5.5" (1.664 m)   Wt 140 lb (63.5 kg)   SpO2 98%   BMI 22.94 kg/m   BP Readings from Last 3 Encounters:  04/18/18 (!) 150/86  08/07/17 134/86  05/28/17 (!) 144/86    Wt Readings from Last 3 Encounters:  04/18/18 140 lb (63.5 kg)  08/07/17 138 lb (62.6 kg)  05/28/17 137 lb (62.1 kg)    Physical Exam Vitals signs reviewed. Exam conducted with a chaperone present.  Constitutional:      General: She is not in acute distress.    Appearance: She is not ill-appearing.  Cardiovascular:     Rate and Rhythm: Normal rate and regular rhythm.     Pulses: Normal pulses.     Heart sounds: Normal heart sounds.  Pulmonary:     Effort: Pulmonary effort is normal.     Breath sounds: Normal breath sounds.  Abdominal:     General: Bowel sounds are normal. There is no distension.     Palpations: Abdomen is soft. There is no mass.     Tenderness: There is abdominal tenderness. There is no right CVA tenderness, left CVA tenderness, guarding  or rebound.     Hernia: No hernia is present.  Genitourinary:    Labia:        Right: No rash or tenderness.        Left: No rash or tenderness.      Vagina: No vaginal discharge, erythema or bleeding.     Adnexa: Right adnexa normal and left adnexa normal.  Lymphadenopathy:     Lower Body: No right inguinal adenopathy. No left inguinal adenopathy.  Neurological:     Mental Status: She is alert and oriented to person, place, and time.    Lab Results  Component Value Date   WBC 12.2 (H) 04/18/2018   HGB 15.0 04/18/2018   HCT 45.1 04/18/2018   PLT 372.0 04/18/2018   GLUCOSE 107 (H) 04/18/2018   CHOL 203 (H) 08/07/2017   TRIG 70.0  08/07/2017   HDL 76.50 08/07/2017   LDLDIRECT 127.1 04/30/2012   LDLCALC 112 (H) 08/07/2017   ALT 14 08/07/2017   AST 14 08/07/2017   NA 137 04/18/2018   K 3.5 04/18/2018   CL 98 04/18/2018   CREATININE 0.68 04/18/2018   BUN 17 04/18/2018   CO2 29 04/18/2018   TSH 0.28 (L) 08/07/2017   HGBA1C 5.7 (A) 08/07/2017    Dg Chest 2 View  Result Date: 10/08/2016 CLINICAL DATA:  Weakness.  Tightness.  Nausea. EXAM: CHEST  2 VIEW COMPARISON:  04/14/2016 FINDINGS: Lateral view degraded by patient arm position. Numerous leads and wires project over the chest. Midline trachea. Normal heart size and mediastinal contours. No pleural effusion or pneumothorax. Mild hyperinflation and interstitial thickening. No lobar consolidation. IMPRESSION: No acute cardiopulmonary disease. Peribronchial thickening which may relate to chronic bronchitis or smoking. Electronically Signed   By: Abigail Miyamoto M.D.   On: 10/08/2016 10:50   Ct Angio Chest Aorta W/cm &/or Wo/cm  Result Date: 10/08/2016 CLINICAL DATA:  Chest pain. EXAM: CT ANGIOGRAPHY CHEST WITH CONTRAST TECHNIQUE: Multidetector CT imaging of the chest was performed using the standard protocol during bolus administration of intravenous contrast. Multiplanar CT image reconstructions and MIPs were obtained to evaluate the vascular anatomy. CONTRAST:  100 cc Isovue 370 intravenous contrast. COMPARISON:  Chest x-ray from same date. Coronary calcium scoring dated May 27, 2014. FINDINGS: Cardiovascular: No evidence of pulmonary embolism to the segmental level. No evidence of thoracic aortic aneurysm or dissection. Normal heart size. No pericardial effusion. Mediastinum/Nodes: No enlarged mediastinal, hilar, or axillary lymph nodes. Thyroid gland, trachea, and esophagus demonstrate no significant findings. Lungs/Pleura: Mild bibasilar subsegmental atelectasis. Lungs are otherwise clear. No pleural effusion or pneumothorax. Upper Abdomen: No acute abnormality. There are two  low-density lesions within the hepatic segment 4A measuring up to 2.0 cm, likely cysts. Additional smaller subcentimeter low-density lesion seen segment 4B is too small to characterize. Small, lobulated appearance of the spleen, which may be related to prior infarct. Musculoskeletal: No chest wall abnormality. No acute or significant osseous findings. Review of the MIP images confirms the above findings. IMPRESSION: 1. No acute intrathoracic process. No evidence of pulmonary embolism. No evidence of acute aortic syndrome. Electronically Signed   By: Titus Dubin M.D.   On: 10/08/2016 15:03    Assessment & Plan:   Teresa was seen today for back pain.  Diagnoses and all orders for this visit:  Colitis, acute -     POCT Urinalysis Dipstick -     Urine Culture -     CBC w/Diff -     Basic metabolic panel -  Discontinue: ciprofloxacin (CIPRO) 250 MG tablet; Take 1 tablet (250 mg total) by mouth 2 (two) times daily. -     CT Abdomen Pelvis W Contrast; Future -     Discontinue: nitrofurantoin, macrocrystal-monohydrate, (MACROBID) 100 MG capsule; Take 1 capsule (100 mg total) by mouth 2 (two) times daily for 5 days. -     ciprofloxacin (CIPRO) 500 MG tablet; Take 1 tablet (500 mg total) by mouth 2 (two) times daily. -     metroNIDAZOLE (FLAGYL) 500 MG tablet; Take 1 tablet (500 mg total) by mouth 3 (three) times daily. -     Ambulatory referral to Gastroenterology  Need for influenza vaccination -     Flu Vaccine QUAD 6+ mos PF IM (Fluarix Quad PF)  Nausea -     CBC w/Diff -     Basic metabolic panel -     CT Abdomen Pelvis W Contrast; Future -     ciprofloxacin (CIPRO) 500 MG tablet; Take 1 tablet (500 mg total) by mouth 2 (two) times daily. -     metroNIDAZOLE (FLAGYL) 500 MG tablet; Take 1 tablet (500 mg total) by mouth 3 (three) times daily. -     Ambulatory referral to Gastroenterology  Vaginal itching -     Cervicovaginal ancillary only( [place])  Acute cystitis without  hematuria -     Discontinue: nitrofurantoin, macrocrystal-monohydrate, (MACROBID) 100 MG capsule; Take 1 capsule (100 mg total) by mouth 2 (two) times daily for 5 days.   I have discontinued Sharah Rounsaville's ciprofloxacin and nitrofurantoin (macrocrystal-monohydrate). I am also having her start on ciprofloxacin and metroNIDAZOLE. Additionally, I am having her maintain her acetaminophen, ALPRAZolam, amLODipine, estradiol, zolpidem, methimazole, and hydrochlorothiazide.  Meds ordered this encounter  Medications  . DISCONTD: ciprofloxacin (CIPRO) 250 MG tablet    Sig: Take 1 tablet (250 mg total) by mouth 2 (two) times daily.    Dispense:  6 tablet    Refill:  0    Order Specific Question:   Supervising Provider    Answer:   MATTHEWS, CODY [[id number]]  . DISCONTD: nitrofurantoin, macrocrystal-monohydrate, (MACROBID) 100 MG capsule    Sig: Take 1 capsule (100 mg total) by mouth 2 (two) times daily for 5 days.    Dispense:  10 capsule    Refill:  0    Order Specific Question:   Supervising Provider    Answer:   Lucille Passy [[id number]]  . ciprofloxacin (CIPRO) 500 MG tablet    Sig: Take 1 tablet (500 mg total) by mouth 2 (two) times daily.    Dispense:  20 tablet    Refill:  0    Order Specific Question:   Supervising Provider    Answer:   MATTHEWS, CODY [[id number]]  . metroNIDAZOLE (FLAGYL) 500 MG tablet    Sig: Take 1 tablet (500 mg total) by mouth 3 (three) times daily.    Dispense:  30 tablet    Refill:  0    Order Specific Question:   Supervising Provider    Answer:   MATTHEWS, CODY [[id number]]    Problem List Items Addressed This Visit      Digestive   Colitis, acute - Primary   Relevant Medications   ciprofloxacin (CIPRO) 500 MG tablet   metroNIDAZOLE (FLAGYL) 500 MG tablet   Other Relevant Orders   POCT Urinalysis Dipstick (Completed)   Urine Culture (Completed)   CBC w/Diff (Completed)   Basic metabolic panel (Completed)   CT Abdomen Pelvis W Contrast (Completed)  Ambulatory  referral to Gastroenterology     Other   Nausea   Relevant Medications   ciprofloxacin (CIPRO) 500 MG tablet   metroNIDAZOLE (FLAGYL) 500 MG tablet   Other Relevant Orders   CBC w/Diff (Completed)   Basic metabolic panel (Completed)   CT Abdomen Pelvis W Contrast (Completed)   Ambulatory referral to Gastroenterology    Other Visit Diagnoses    Need for influenza vaccination       Relevant Orders   Flu Vaccine QUAD 6+ mos PF IM (Fluarix Quad PF) (Completed)   Vaginal itching       Relevant Orders   Cervicovaginal ancillary only( [place]) (Completed)   Acute cystitis without hematuria           Follow-up: No follow-ups on file.  Wilfred Lacy, NP

## 2018-04-18 NOTE — Patient Instructions (Addendum)
.  Elevated cbc. CT ABD/pelvic ordered. Urine sent for urine culture  Start cipro as prescribed (take with food).

## 2018-04-19 ENCOUNTER — Encounter: Payer: Self-pay | Admitting: Nurse Practitioner

## 2018-04-21 LAB — URINE CULTURE
MICRO NUMBER: 197317
SPECIMEN QUALITY:: ADEQUATE

## 2018-04-21 LAB — CERVICOVAGINAL ANCILLARY ONLY
Bacterial vaginitis: NEGATIVE
Candida vaginitis: NEGATIVE

## 2018-04-21 MED ORDER — NITROFURANTOIN MONOHYD MACRO 100 MG PO CAPS: 100.0000 mg | ORAL_CAPSULE | Freq: Two times a day (BID) | ORAL | 0 refills | Status: DC

## 2018-04-21 NOTE — Addendum Note (Signed)
Addended by: Leana Gamer on: 04/21/2018 07:47 AM   Modules accepted: Orders

## 2018-05-06 ENCOUNTER — Ambulatory Visit (INDEPENDENT_AMBULATORY_CARE_PROVIDER_SITE_OTHER)
Admission: RE | Admit: 2018-05-06 | Discharge: 2018-05-06 | Disposition: A | Discharge status: Home / Self Care | Payer: No Typology Code available for payment source | Source: Ambulatory Visit | Attending: Nurse Practitioner | Admitting: Nurse Practitioner

## 2018-05-06 DIAGNOSIS — R1032 Left lower quadrant pain: Secondary | ICD-10-CM | POA: Diagnosis not present

## 2018-05-06 DIAGNOSIS — R110 Nausea: Secondary | ICD-10-CM | POA: Diagnosis not present

## 2018-05-06 DIAGNOSIS — K529 Noninfective gastroenteritis and colitis, unspecified: Secondary | ICD-10-CM | POA: Insufficient documentation

## 2018-05-06 MED ORDER — IOPAMIDOL (ISOVUE-300) INJECTION 61%
100.0000 mL | Freq: Once | INTRAVENOUS | Status: AC | PRN
  Administered 2018-05-06: 100 mL via INTRAVENOUS

## 2018-05-06 MED ORDER — METRONIDAZOLE 500 MG PO TABS: 500.0000 mg | ORAL_TABLET | Freq: Three times a day (TID) | ORAL | 0 refills | Status: DC

## 2018-05-06 MED ORDER — CIPROFLOXACIN HCL 500 MG PO TABS: 500.0000 mg | ORAL_TABLET | Freq: Two times a day (BID) | ORAL | 0 refills | Status: DC

## 2018-05-06 NOTE — Addendum Note (Signed)
Addended by: Leana Gamer on: 05/06/2018 02:42 PM   Modules accepted: Orders

## 2018-05-06 NOTE — Addendum Note (Signed)
Addended by: Leana Gamer on: 05/06/2018 03:22 PM   Modules accepted: Orders

## 2018-05-13 ENCOUNTER — Ambulatory Visit: Payer: Self-pay

## 2018-05-13 ENCOUNTER — Other Ambulatory Visit: Payer: Self-pay | Admitting: Nurse Practitioner

## 2018-05-13 DIAGNOSIS — K529 Noninfective gastroenteritis and colitis, unspecified: Secondary | ICD-10-CM

## 2018-05-13 MED ORDER — AMOXICILLIN-POT CLAVULANATE 875-125 MG PO TABS: 1.0000 | ORAL_TABLET | Freq: Two times a day (BID) | ORAL | 0 refills | Status: DC

## 2018-05-13 NOTE — Telephone Encounter (Signed)
Anna Moore please advise, we sent in cipro and flagyl on 05/06/2018.

## 2018-05-13 NOTE — Telephone Encounter (Signed)
Pt c/o Flagyl making her feel more nauseated. Pt stated that she did not take her Flagyl last night or this morning and she said she "feels better today" and is tolerating water and yogurt. Pt stated she would like to know if she can be taken off the Flagyl. Pt stated that she is still taking the Cipro.  Care advice given to pt and pt verbalized understanding. Not e routed to provider.   Reason for Disposition . Taking prescription medication that could cause nausea (e.g., narcotics/opiates, antibiotics, OCPs, many others)  Answer Assessment - Initial Assessment Questions 1. NAUSEA SEVERITY: "How bad is the nausea?" (e.g., mild, moderate, severe; dehydration, weight loss)   - MILD: loss of appetite without change in eating habits   - MODERATE: decreased oral intake without significant weight loss, dehydration, or malnutrition   - SEVERE: inadequate caloric or fluid intake, significant weight loss, symptoms of dehydration moderate 2. ONSET: "When did the nausea begin?"     Sat Sunday 3. VOMITING: "Any vomiting?" If so, ask: "How many times today?"     n/a 4. RECURRENT SYMPTOM: "Have you had nausea before?" If so, ask: "When was the last time?" "What happened that time?"     20 18 5. CAUSE: "What do you think is causing the nausea?"     The Flagyl- did not take it last night this morning 6. PREGNANCY: "Is there any chance you are pregnant?" (e.g., unprotected intercourse, missed birth control pill, broken condom)     n/a  Protocols used: NAUSEA-A-AH

## 2018-05-13 NOTE — Telephone Encounter (Signed)
Stop cipro and flagyl. Start augmentin x 10days

## 2018-05-14 NOTE — Telephone Encounter (Signed)
Left vm for the pt to call back, need to inform [place] message below. Ok for triage nurse to give detail message.  

## 2018-05-14 NOTE — Telephone Encounter (Signed)
Pt given instructions per Wilfred Lacy,

## 2018-05-14 NOTE — Progress Notes (Signed)
Pt given information per [facility], "stop cipro and flagyl, start augmentin for 10 days;  amoxicillin-clavulanate (AUGMENTIN) 875-125 MG tablet 20 tablet 0 05/13/2018   Take 1 tablet by mouth 2 (two) times daily. - Oral   Informed pt that this was sent to [facility] [address]; she verbalized understanding; will route to office for notification,

## 2018-08-11 ENCOUNTER — Other Ambulatory Visit: Payer: Self-pay | Admitting: Family Medicine

## 2018-08-26 NOTE — Patient Instructions (Addendum)
Please call [facility] at [phone] to set up your Colonoscopy :)  Please schedule Mammogram :)  Great to see you! I will call you with your lab results from today and you can view them online.    Let me know how your ortho appointment goes and I hope your husband's surgery goes well.

## 2018-08-27 ENCOUNTER — Encounter: Payer: Self-pay | Admitting: Family Medicine

## 2018-08-27 ENCOUNTER — Ambulatory Visit (INDEPENDENT_AMBULATORY_CARE_PROVIDER_SITE_OTHER): Payer: No Typology Code available for payment source | Admitting: Family Medicine

## 2018-08-27 VITALS — BP 136/82 | HR 85 | Temp 98.2°F | Ht 65.5 in | Wt 136.0 lb

## 2018-08-27 DIAGNOSIS — E0590 Thyrotoxicosis, unspecified without thyrotoxic crisis or storm: Secondary | ICD-10-CM | POA: Diagnosis not present

## 2018-08-27 DIAGNOSIS — E785 Hyperlipidemia, unspecified: Secondary | ICD-10-CM

## 2018-08-27 DIAGNOSIS — I10 Essential (primary) hypertension: Secondary | ICD-10-CM

## 2018-08-27 DIAGNOSIS — Z1239 Encounter for other screening for malignant neoplasm of breast: Secondary | ICD-10-CM | POA: Diagnosis not present

## 2018-08-27 DIAGNOSIS — Z114 Encounter for screening for human immunodeficiency virus [HIV]: Secondary | ICD-10-CM

## 2018-08-27 DIAGNOSIS — Z79890 Hormone replacement therapy (postmenopausal): Secondary | ICD-10-CM

## 2018-08-27 DIAGNOSIS — G4700 Insomnia, unspecified: Secondary | ICD-10-CM

## 2018-08-27 DIAGNOSIS — Z0000 Encounter for general adult medical examination without abnormal findings: Secondary | ICD-10-CM

## 2018-08-27 DIAGNOSIS — K635 Polyp of colon: Secondary | ICD-10-CM

## 2018-08-27 DIAGNOSIS — R739 Hyperglycemia, unspecified: Secondary | ICD-10-CM

## 2018-08-27 MED ORDER — ZOLPIDEM TARTRATE 5 MG PO TABS: 5.0000 mg | ORAL_TABLET | Freq: Every evening | ORAL | 5 refills | Status: DC | PRN

## 2018-08-27 MED ORDER — ALPRAZOLAM 0.25 MG PO TABS: 0.2500 mg | ORAL_TABLET | Freq: Two times a day (BID) | ORAL | 5 refills | Status: DC | PRN

## 2018-08-27 NOTE — Assessment & Plan Note (Signed)
  The problem of recurrent insomnia is discussed. Avoidance of caffeine sources is strongly encouraged. Sleep hygiene issues are reviewed. The use of sedative hypnotics for temporary relief is appropriate; we discussed the addictive nature of these drugs, and a one-time only prescription for prn use of a hypnotic is given, to use no more than 3 times per week for 2-3 weeks.

## 2018-08-27 NOTE — Assessment & Plan Note (Signed)
She will schedule colonoscopy.

## 2018-08-27 NOTE — Progress Notes (Signed)
Subjective:   Patient ID: Anna Moore, female    DOB: 1956/01/20, 63 y.o.   MRN: [id number]  Anna Moore is a pleasant 63 y.o. year old female who presents to clinic today with Annual Exam (Pt screened at vehicle. Pt is here today for a CPE without PAP.  She is due for a Colonoscopy at [facility]. Her immunizations are UTD. She is due for a Mammogram and will call [facility] to schedule if order is placed.  She is currently fasting.  She agrees to HIV screening.)  on 08/27/2018  HPI:  Health Maintenance  Topic Date Due   HIV Screening  03/03/1970   COLONOSCOPY  09/03/2018 (Originally 06/03/2015)   INFLUENZA VACCINE  10/04/2018   MAMMOGRAM  09/27/2019   TETANUS/TDAP  08/08/2027   Hepatitis C Screening  Completed    Remote h/o hysterectomy.  Health Maintenance  Topic Date Due   HIV Screening  03/03/1970   COLONOSCOPY  09/03/2018 (Originally 06/03/2015)   INFLUENZA VACCINE  10/04/2018   MAMMOGRAM  09/27/2019   TETANUS/TDAP  08/08/2027   Hepatitis C Screening  Completed   HTN- Currently taking HCTZ 25 mg daily and Norvasc 20 mg.  Lab Results  Component Value Date   CREATININE 0.68 04/18/2018    Was also told that her fasting blood sugar at urgent care was 141.  Denies increased thirst or urination.  Hyperthyroidism- taking Methimazole.  Denies any symptoms of hypo or hyperthyroidism. Lab Results  Component Value Date   TSH 0.28 (L) 08/07/2017   Insomnia- Takes Lorrin Mais maybe once a month.  She is asking for refills of this given the stress of the pandemic.  Depression screen [facility] 2/9 08/27/2018 08/07/2017 08/06/2016 08/02/2015 07/28/2014  Decreased Interest 0 0 0 0 0  Down, Depressed, Hopeless 0 0 0 0 0  PHQ - 2 Score 0 0 0 0 0      Current Outpatient Medications on File Prior to Visit  Medication Sig Dispense Refill   acetaminophen (TYLENOL) 500 MG tablet Take 500 mg by mouth every 6 (six) hours as needed (pain, fever).     amLODipine (NORVASC) 10  MG tablet Take 1 tablet by mouth once daily 30 tablet 0   estradiol (ESTRACE) 1 MG tablet Take 1 tablet by mouth twice daily 60 tablet 0   hydrochlorothiazide (HYDRODIURIL) 25 MG tablet Take 1 tablet by mouth once daily 90 tablet 0   methimazole (TAPAZOLE) 5 MG tablet TAKE 1 TABLET BY MOUTH THREE TIMES A WEEK 90 tablet 1   No current facility-administered medications on file prior to visit.     No Known Allergies  Past Medical History:  Diagnosis Date   Anxiety    Cough    Questionably ACE related   DJD (degenerative joint disease)    Of the back   Fatigue    Significant fatigue in the setting of back pain and diaphoresis   Fatigue    Forgetfulness    History of tobacco abuse    Hot flashes    Hypertension    Palpitations     Past Surgical History:  Procedure Laterality Date   ABDOMINAL HYSTERECTOMY     BACK SURGERY  01/2008   On L4 and L5   NASAL SEPTUM SURGERY     OTHER SURGICAL HISTORY  1998   Hysterectomy   PAROTIDECTOMY  1990    Family History  Problem Relation Age of Onset   Arrhythmia Mother    Hypertension Father    Heart  disease Father    Heart attack Father        76's   Cancer Father        non hodgkins lymphoma   Colon cancer Neg Hx    Thyroid disease Neg Hx     Social History   Socioeconomic History   Marital status: Married    Spouse name: Not on file   Number of children: Not on file   Years of education: Not on file   Highest education level: Not on file  Occupational History   Not on file  Social Needs   Financial resource strain: Not on file   Food insecurity    Worry: Not on file    Inability: Not on file   Transportation needs    Medical: Not on file    Non-medical: Not on file  Tobacco Use   Smoking status: Former Smoker    Types: Cigarettes    Quit date: 04/08/2016    Years since quitting: 2.3   Smokeless tobacco: Never Used  Substance and Sexual Activity   Alcohol use: Yes     Alcohol/week: 6.0 standard drinks    Types: 6 Cans of beer per week    Comment: 5 beers a week   Drug use: No   Sexual activity: Not on file  Lifestyle   Physical activity    Days per week: Not on file    Minutes per session: Not on file   Stress: Not on file  Relationships   Social connections    Talks on phone: Not on file    Gets together: Not on file    Attends religious service: Not on file    Active member of club or organization: Not on file    Attends meetings of clubs or organizations: Not on file    Relationship status: Not on file   Intimate partner violence    Fear of current or ex partner: Not on file    Emotionally abused: Not on file    Physically abused: Not on file    Forced sexual activity: Not on file  Other Topics Concern   Not on file  Social History Narrative   Married.  One daughter, 64 yo- moving back in with her.   Retired Medical illustrator.   The PMH, PSH, Social History, Family History, Medications, and allergies have been reviewed in [facility], and have been updated if relevant.    Review of Systems  Constitutional: Negative.   HENT: Negative.   Eyes: Negative.   Respiratory: Negative.   Cardiovascular: Negative.   Gastrointestinal: Negative.   Endocrine: Negative.   Genitourinary: Negative.   Musculoskeletal: Negative.   Skin: Negative.   Allergic/Immunologic: Negative.   Neurological: Negative.   Hematological: Negative.   Psychiatric/Behavioral: Positive for sleep disturbance. Negative for agitation, behavioral problems, confusion, decreased concentration, dysphoric mood, hallucinations, self-injury and suicidal ideas. The patient is nervous/anxious. The patient is not hyperactive.   All other systems reviewed and are negative.      Objective:    BP 136/82 (BP Location: Right Arm, Patient Position: Sitting, Cuff Size: Normal)    Pulse 85    Temp 98.2 F (36.8 C) (Oral)    Ht 5' 5.5" (1.664 m)    Wt 136 lb (61.7 kg)    SpO2 98%     BMI 22.29 kg/m    Physical Exam   General:  Well-developed,well-nourished,in no acute distress; alert,appropriate and cooperative throughout examination Head:  normocephalic and atraumatic.  Eyes:  vision grossly intact, PERRL Ears:  R ear normal and L ear normal externally, TMs clear bilaterally Nose:  no external deformity.   Mouth:  good dentition.   Neck:  No deformities, masses, or tenderness noted. Breasts:  No mass, nodules, thickening, tenderness, bulging, retraction, inflamation, nipple discharge or skin changes noted.   Lungs:  Normal respiratory effort, chest expands symmetrically. Lungs are clear to auscultation, no crackles or wheezes. Heart:  Normal rate and regular rhythm. S1 and S2 normal without gallop, murmur, click, rub or other extra sounds. Abdomen:  Bowel sounds positive,abdomen soft and non-tender without masses, organomegaly or hernias noted. Msk:  No deformity or scoliosis noted of thoracic or lumbar spine.   Extremities:  No clubbing, cyanosis, edema, or deformity noted with normal full range of motion of all joints.   Neurologic:  alert & oriented X3 and gait normal.   Skin:  Intact without suspicious lesions or rashes Cervical Nodes:  No lymphadenopathy noted Axillary Nodes:  No palpable lymphadenopathy Psych:  Cognition and judgment appear intact. Alert and cooperative with normal attention span and concentration. No apparent delusions, illusions, hallucinations        Assessment & Plan:   Routine general medical examination at a health care facility - Plan: Comp Met (CMET), CBC w/Diff, Lipid Profile, TSH, T3, T4, free, HgB A1c,   Screening for breast cancer - Plan:   Hyperlipidemia, unspecified hyperlipidemia type - Plan: Comp Met (CMET), CBC w/Diff, Lipid Profile,   Hyperthyroidism - Plan: TSH, T3, T4, free,   Hyperglycemia - Plan: Comp Met (CMET), CBC w/Diff, Lipid Profile, HgB A1c, No follow-ups on file.

## 2018-08-27 NOTE — Assessment & Plan Note (Signed)
Controlled with estrace.  S/p hysterectomy so does not need Prometrium in addition to this.

## 2018-08-27 NOTE — Assessment & Plan Note (Signed)
Continue current rx. Check labs today. 

## 2018-08-27 NOTE — Assessment & Plan Note (Signed)
Reviewed preventive care protocols, scheduled due services, and updated immunizations Discussed nutrition, exercise, diet, and healthy lifestyle.  Mammogram ordered and phone number for breast center given to pt to schedule her own mammogram.  Pt will call to schedule colonscopy.

## 2018-08-28 ENCOUNTER — Other Ambulatory Visit: Payer: Self-pay | Admitting: Family Medicine

## 2018-08-28 LAB — CBC WITH DIFFERENTIAL/PLATELET
Absolute Monocytes: 715 cells/uL (ref 200–950)
Basophils Absolute: 44 cells/uL (ref 0–200)
Basophils Relative: 0.4 %
Eosinophils Absolute: 66 cells/uL (ref 15–500)
Eosinophils Relative: 0.6 %
HCT: 45.3 % — ABNORMAL HIGH (ref 35.0–45.0)
Hemoglobin: 15.6 g/dL — ABNORMAL HIGH (ref 11.7–15.5)
Lymphs Abs: 2640 cells/uL (ref 850–3900)
MCH: 31.6 pg (ref 27.0–33.0)
MCHC: 34.4 g/dL (ref 32.0–36.0)
MCV: 91.9 fL (ref 80.0–100.0)
MPV: 10.6 fL (ref 7.5–12.5)
Monocytes Relative: 6.5 %
Neutro Abs: 7535 cells/uL (ref 1500–7800)
Neutrophils Relative %: 68.5 %
Platelets: 391 10*3/uL (ref 140–400)
RBC: 4.93 10*6/uL (ref 3.80–5.10)
RDW: 13.1 % (ref 11.0–15.0)
Total Lymphocyte: 24 %
WBC: 11 10*3/uL — ABNORMAL HIGH (ref 3.8–10.8)

## 2018-08-28 LAB — T3: T3, Total: 128 ng/dL (ref 76–181)

## 2018-08-28 LAB — COMPREHENSIVE METABOLIC PANEL
AG Ratio: 2.1 (calc) (ref 1.0–2.5)
ALT: 15 U/L (ref 6–29)
AST: 18 U/L (ref 10–35)
Albumin: 4.9 g/dL (ref 3.6–5.1)
Alkaline phosphatase (APISO): 69 U/L (ref 37–153)
BUN: 11 mg/dL (ref 7–25)
CO2: 24 mmol/L (ref 20–32)
Calcium: 10.7 mg/dL — ABNORMAL HIGH (ref 8.6–10.4)
Chloride: 99 mmol/L (ref 98–110)
Creat: 0.66 mg/dL (ref 0.50–0.99)
Globulin: 2.3 g/dL (calc) (ref 1.9–3.7)
Glucose, Bld: 101 mg/dL — ABNORMAL HIGH (ref 65–99)
Potassium: 4.3 mmol/L (ref 3.5–5.3)
Sodium: 137 mmol/L (ref 135–146)
Total Bilirubin: 0.6 mg/dL (ref 0.2–1.2)
Total Protein: 7.2 g/dL (ref 6.1–8.1)

## 2018-08-28 LAB — LIPID PANEL
Cholesterol: 254 mg/dL — ABNORMAL HIGH (ref <200)
HDL: 85 mg/dL (ref >=50)
LDL Cholesterol (Calc): 144 mg/dL (calc) — ABNORMAL HIGH
Non-HDL Cholesterol (Calc): 169 mg/dL (calc) — ABNORMAL HIGH (ref <130)
Total CHOL/HDL Ratio: 3 (calc) (ref <5.0)
Triglycerides: 128 mg/dL (ref <150)

## 2018-08-28 LAB — T4, FREE: Free T4: 1.3 ng/dL (ref 0.8–1.8)

## 2018-08-28 LAB — TSH: TSH: 0.68 mIU/L (ref 0.40–4.50)

## 2018-08-28 LAB — HEMOGLOBIN A1C
Hgb A1c MFr Bld: 5.4 % of total Hgb (ref <5.7)
Mean Plasma Glucose: 108 (calc)
eAG (mmol/L): 6 (calc)

## 2018-08-29 NOTE — Addendum Note (Signed)
Addended by: Marrion Coy on: 08/29/2018 02:24 PM   Modules accepted: Orders

## 2018-09-01 ENCOUNTER — Other Ambulatory Visit: Payer: Self-pay

## 2018-09-01 ENCOUNTER — Other Ambulatory Visit (INDEPENDENT_AMBULATORY_CARE_PROVIDER_SITE_OTHER): Payer: No Typology Code available for payment source

## 2018-09-01 NOTE — Addendum Note (Signed)
Addended by: Lurlean Nanny on: 09/01/2018 03:20 PM   Modules accepted: Orders

## 2018-09-01 NOTE — Addendum Note (Signed)
Addended by: Lurlean Nanny on: 09/01/2018 02:39 PM   Modules accepted: Orders

## 2018-09-04 LAB — SPECIMEN STATUS REPORT

## 2018-09-04 LAB — INTACT PTH (INCLUDES CALCIUM)
Calcium, Serum: 11.1 mg/dL — ABNORMAL HIGH
PTH (Intact Assay): 62 pg/mL

## 2018-09-09 ENCOUNTER — Encounter: Payer: Self-pay | Admitting: Family Medicine

## 2018-09-09 ENCOUNTER — Other Ambulatory Visit (INDEPENDENT_AMBULATORY_CARE_PROVIDER_SITE_OTHER): Payer: No Typology Code available for payment source | Admitting: Family Medicine

## 2018-09-09 DIAGNOSIS — E0590 Thyrotoxicosis, unspecified without thyrotoxic crisis or storm: Secondary | ICD-10-CM

## 2018-09-09 NOTE — Assessment & Plan Note (Signed)
With high normal PTH.  Will refer to endo given that she is also hyperparathyroid.  ? Next step is SPEP/UPEP. The patient indicates understanding of these issues and agrees with the plan.

## 2018-09-09 NOTE — Progress Notes (Signed)
[facility] VISIT   Patient agreed to [facility] visit and is aware that copayment and coinsurance may apply. Patient was treated using telemedicine according to accepted telemedicine protocols.  Subjective:   Patient complains of persistent hypercalcemia.  Saw pt on 08/27/18 for routine CPX when incidentally her calcium was found to be 10.7.  She denies taking any calcium supplements and does not like dairy.  She does have a h/o hyperthyroidism on methimazole and HTN on HCTZ as one of her medications- both of which can lead to hypercalcemia.    Used to be followed by endo for her hyperthyroidism but stopped going as her labs were stable.  Lab Results  Component Value Date   TSH 0.68 08/27/2018   T3TOTAL 128 08/27/2018   I therefore an intact PTH and calcium.  PT was high normal at 62 and her calcium actually increased to 11.1.  Denies bone pain.  Does not take tums.  Patient Active Problem List   Diagnosis Date Noted  . Hyperglycemia 08/07/2017  . Colon polyps 08/07/2017  . Panic attacks 10/11/2016  . Dehydration 04/12/2016  . Postmenopausal HRT (hormone replacement therapy) 07/28/2014  . Hyperlipidemia 05/20/2014  . Hyperthyroidism 09/22/2013  . Routine general medical examination at a health care facility 07/16/2013  . Insomnia 07/16/2013  . Post menopausal problems 04/30/2012  . Tobacco abuse 04/30/2012  . Eczema, dyshidrotic 04/30/2012  . Hypertension   . Anxiety   . DJD (degenerative joint disease)    Social History   Tobacco Use  . Smoking status: Former Smoker    Types: Cigarettes    Quit date: 04/08/2016    Years since quitting: 2.4  . Smokeless tobacco: Never Used  Substance Use Topics  . Alcohol use: Yes    Alcohol/week: 6.0 standard drinks    Types: 6 Cans of beer per week    Comment: 5 beers a week    Current Outpatient Medications:  .  acetaminophen (TYLENOL) 500 MG tablet, Take 500 mg by mouth every 6 (six) hours as needed (pain, fever)., Disp: ,  Rfl:  .  ALPRAZolam (XANAX) 0.25 MG tablet, Take 1 tablet (0.25 mg total) by mouth 2 (two) times daily as needed. for anxiety, Disp: 30 tablet, Rfl: 5 .  amLODipine (NORVASC) 10 MG tablet, Take 1 tablet by mouth once daily, Disp: 30 tablet, Rfl: 0 .  estradiol (ESTRACE) 1 MG tablet, Take 1 tablet by mouth twice daily, Disp: 60 tablet, Rfl: 0 .  hydrochlorothiazide (HYDRODIURIL) 25 MG tablet, Take 1 tablet by mouth once daily, Disp: 90 tablet, Rfl: 0 .  methimazole (TAPAZOLE) 5 MG tablet, TAKE 1 TABLET BY MOUTH THREE TIMES A WEEK, Disp: 90 tablet, Rfl: 1 .  zolpidem (AMBIEN) 5 MG tablet, Take 1 tablet (5 mg total) by mouth at bedtime as needed. for sleep, Disp: 30 tablet, Rfl: 5  No Known Allergies  Assessment and Plan:   Diagnosis:persistently elevating serum calcium. Please see myChart communication and orders below.   Orders Placed This Encounter  Procedures  . Ambulatory referral to Endocrinology   No orders of the defined types were placed in this encounter.   Arnette Norris, MD 09/09/2018  A total of 5 minutes were spent by me to personally review the patient-generated inquiry, review patient records and data pertinent to assessment of the patient's problem, develop a management plan including generation of prescriptions and/or orders, and on subsequent communication with the patient through secure the MyChart portal service.   There is no separately reported E/M  service related to this service in the past 7 days nor does the patient have an upcoming soonest available appointment for this issue. This work was completed in less than 7 days.   The patient consented to this service today (see patient agreement prior to ongoing communication). Patient counseled regarding the need for in-person exam for certain conditions and was advised to call the office if any changing or worsening symptoms occur.   The codes to be used for the E/M service are: [x]   99421 for 5-10 minutes of time spent on  the inquiry. []   [id number] for 11-20 minutes. []   X700321 for 21+ minutes.

## 2018-09-12 ENCOUNTER — Other Ambulatory Visit: Payer: Self-pay | Admitting: Family Medicine

## 2018-09-26 ENCOUNTER — Encounter: Payer: No Typology Code available for payment source | Admitting: Internal Medicine

## 2018-09-26 DIAGNOSIS — Z0289 Encounter for other administrative examinations: Secondary | ICD-10-CM

## 2018-10-14 LAB — HM MAMMOGRAPHY

## 2018-10-15 ENCOUNTER — Other Ambulatory Visit: Payer: Self-pay

## 2018-10-16 ENCOUNTER — Encounter: Payer: Self-pay | Admitting: Family Medicine

## 2018-10-16 NOTE — Progress Notes (Signed)
[facility]/thx dmf 

## 2018-10-17 ENCOUNTER — Encounter: Payer: Self-pay | Admitting: Internal Medicine

## 2018-10-17 ENCOUNTER — Other Ambulatory Visit: Payer: Self-pay

## 2018-10-17 ENCOUNTER — Ambulatory Visit (INDEPENDENT_AMBULATORY_CARE_PROVIDER_SITE_OTHER): Payer: No Typology Code available for payment source | Admitting: Internal Medicine

## 2018-10-17 VITALS — BP 132/72 | HR 82 | Temp 98.7°F | Ht 66.0 in | Wt 142.8 lb

## 2018-10-17 DIAGNOSIS — E0590 Thyrotoxicosis, unspecified without thyrotoxic crisis or storm: Secondary | ICD-10-CM

## 2018-10-17 LAB — BASIC METABOLIC PANEL
BUN: 13 mg/dL (ref 6–23)
CO2: 29 mEq/L (ref 19–32)
Calcium: 9.8 mg/dL (ref 8.4–10.5)
Chloride: 100 mEq/L (ref 96–112)
Creatinine, Ser: 0.59 mg/dL (ref 0.40–1.20)
GFR: 102.74 mL/min (ref >60.00)
Glucose, Bld: 96 mg/dL (ref 70–99)
Potassium: 3.7 mEq/L (ref 3.5–5.1)
Sodium: 137 mEq/L (ref 135–145)

## 2018-10-17 LAB — TSH: TSH: 0.59 u[IU]/mL (ref 0.35–4.50)

## 2018-10-17 LAB — VITAMIN D 25 HYDROXY (VIT D DEFICIENCY, FRACTURES): VITD: 37.57 ng/mL (ref 30.00–100.00)

## 2018-10-17 LAB — ALBUMIN: Albumin: 4.7 g/dL (ref 3.5–5.2)

## 2018-10-17 LAB — T4, FREE: Free T4: 0.88 ng/dL (ref 0.60–1.60)

## 2018-10-17 NOTE — Progress Notes (Signed)
Name: Anna Moore  MRN/ DOB: [id number], [birth year]    Age/ Sex: 63 y.o., female    PCP: Anna Passy, MD   Reason for Endocrinology Evaluation: Hyperthyroidism/Hypercalcemia      Date of Initial Endocrinology Evaluation: 10/17/2018     HPI: [name] is a 63 y.o. female with a past medical history of HTN, Hyperthyroidism and DJD. The patient presented for initial endocrinology clinic visit on 10/17/2018 for consultative assistance with her Hyperthyroidism/Hypercalcemia   THYROID HISTORY: Pt has been diagnosed with hyperthyroidism since 2015. She has been on Methimazole since 2015. She denies local neck symptoms.  She endorses night sweats and insomnia.   Methimazole 5 mg Three days a week  Takes Biotin - stopped   HYPERCALCEMIA HISTORY:  Pt noted to have hypercalcemia on routine labs in 08/2018 with a serum calcium of 10.7 mg/dL (non-corrected) with a normal PTH.   [name] indicates that she was first diagnosed with hypercalcemia in 08/2018, during routine workup. With a serum calcium of 10.7 mg/dL (non-corrected) with a normal PTH.  Since that time, she has not experienced symptoms of constipation, polyuria, polydipsia, generalized weakness, diffuse muscle pains, significant memory impairment. She denies use of over the counter calcium since 08/2018. She has never been on  Lithium.  Has been on  HCTZ Was on  Vitamin D but stopped.    She denies  history of kidney stones, kidney disease, liver disease, granulomatous disease.Father with kidney stones.  She denies osteoporosis or prior fractures. Daily dietary calcium intake: 2 servings (mainly cheese). She has a family history of osteoporosis (mother) , but no parathyroid disease, thyroid disease.       HISTORY:  Past Medical History:  Past Medical History:  Diagnosis Date  . Anxiety   . Cough    Questionably ACE related  . DJD (degenerative joint disease)    Of the back  . Fatigue    Significant  fatigue in the setting of back pain and diaphoresis  . Fatigue   . Forgetfulness   . History of tobacco abuse   . Hot flashes   . Hypertension   . Palpitations    Past Surgical History:  Past Surgical History:  Procedure Laterality Date  . ABDOMINAL HYSTERECTOMY    . BACK SURGERY  01/2008   On L4 and L5  . NASAL SEPTUM SURGERY    . OTHER SURGICAL HISTORY  1998   Hysterectomy  . PAROTIDECTOMY  1990      Social History:  reports that she quit smoking about 2 years ago. Her smoking use included cigarettes. She has never used smokeless tobacco. She reports current alcohol use of about 6.0 standard drinks of alcohol per week. She reports that she does not use drugs.  Family History: family history includes Arrhythmia in her mother; Cancer in her father; Heart attack in her father; Heart disease in her father; Hypertension in her father.   HOME MEDICATIONS: Allergies as of 10/17/2018   No Known Allergies     Medication List       Accurate as of October 17, 2018 10:34 AM. If you have any questions, ask your nurse or doctor.        acetaminophen 500 MG tablet Commonly known as: TYLENOL Take 500 mg by mouth every 6 (six) hours as needed (pain, fever).   ALPRAZolam 0.25 MG tablet Commonly known as: XANAX Take 1 tablet (0.25 mg total) by mouth 2 (two) times daily as needed. for anxiety  amLODipine 10 MG tablet Commonly known as: NORVASC Take 1 tablet by mouth once daily   estradiol 1 MG tablet Commonly known as: ESTRACE Take 1 tablet by mouth twice daily   hydrochlorothiazide 25 MG tablet Commonly known as: HYDRODIURIL Take 1 tablet by mouth once daily   methimazole 5 MG tablet Commonly known as: TAPAZOLE TAKE 1 TABLET BY MOUTH THREE TIMES A WEEK   zolpidem 5 MG tablet Commonly known as: AMBIEN Take 1 tablet (5 mg total) by mouth at bedtime as needed. for sleep         REVIEW OF SYSTEMS: A comprehensive ROS was conducted with the patient and is negative  except as per HPI and below:  Review of Systems  Eyes: Negative for blurred vision and photophobia.  Respiratory: Negative for cough and shortness of breath.   Cardiovascular: Negative for chest pain and palpitations.  Gastrointestinal: Negative for diarrhea and nausea.  Genitourinary: Negative for frequency.  Neurological: Negative for tingling and tremors.  Endo/Heme/Allergies: Negative for polydipsia.  Psychiatric/Behavioral: Negative for depression. The patient is nervous/anxious.        OBJECTIVE:  VS: BP 132/72 (BP Location: Left Arm, Patient Position: Sitting, Cuff Size: Normal)   Pulse 82   Temp 98.7 F (37.1 C)   Ht 5\' 6"  (1.676 m)   Wt 142 lb 12.8 oz (64.8 kg)   SpO2 98%   BMI 23.05 kg/m    Wt Readings from Last 3 Encounters:  10/17/18 142 lb 12.8 oz (64.8 kg)  08/27/18 136 lb (61.7 kg)  04/18/18 140 lb (63.5 kg)     EXAM: General: Pt appears well and is in NAD  Hydration: Well-hydrated with moist mucous membranes and good skin turgor  Eyes: External eye exam normal without stare, lid lag or exophthalmos.  EOM intact.  PERRL.  Ears, Nose, Throat: Hearing: Grossly intact bilaterally Dental: Good dentition  Throat: Clear without mass, erythema or exudate  Neck: General: Supple without adenopathy. Thyroid: Thyroid size normal.  No goiter or nodules appreciated. No thyroid bruit.  Lungs: Clear with good BS bilat with no rales, rhonchi, or wheezes  Heart: Auscultation: RRR.  Abdomen: Normoactive bowel sounds, soft, nontender, without masses or organomegaly palpable  Extremities:  BL LE: No pretibial edema normal ROM and strength.  Skin: Hair: Texture and amount normal with gender appropriate distribution Skin Inspection: No rashes, acanthosis nigricans/skin tags. No lipohypertrophy Skin Palpation: Skin temperature, texture, and thickness normal to palpation  Neuro: Cranial nerves: II - XII grossly intact  Motor: Normal strength throughout DTRs: 2+ and symmetric  in UE without delay in relaxation phase  Mental Status: Judgment, insight: Intact Orientation: Oriented to time, place, and person Mood and affect: No depression, anxiety, or agitation     DATA REVIEWED: Results for Anna Moore (MRN [id number]) as of 10/20/2018 12:38  Ref. Range 10/17/2018 11:12 10/17/2018 11:12  Sodium Latest Ref Range: 135 - 145 mEq/L 137   Potassium Latest Ref Range: 3.5 - 5.1 mEq/L 3.7   Chloride Latest Ref Range: 96 - 112 mEq/L 100   CO2 Latest Ref Range: 19 - 32 mEq/L 29   Glucose Latest Ref Range: 70 - 99 mg/dL 96   BUN Latest Ref Range: 6 - 23 mg/dL 13   Creatinine Latest Ref Range: 0.40 - 1.20 mg/dL 0.59   Calcium Latest Ref Range: 8.6 - 10.4 mg/dL 9.8 10.1  Albumin Latest Ref Range: 3.5 - 5.2 g/dL 4.7   GFR Latest Ref Range: >60.00 mL/min 102.74  VITD Latest Ref Range: 30.00 - 100.00 ng/mL 37.57   PTH, Intact Latest Ref Range: 14 - 64 pg/mL  39  TSH Latest Ref Range: 0.35 - 4.50 uIU/mL 0.59   T4,Free(Direct) Latest Ref Range: 0.60 - 1.60 ng/dL 0.88     ASSESSMENT/PLAN/RECOMMENDATIONS:   1. Hypercalcemia :   - Resolved - Corrected calcium is 9.54 mg/dL on today's exam  - Unclear etiology of a single elevation in calcium, could be due to high albumin at the time , dehydration or the increase in HCTZ,(pt stated her dose was recently increased) HCTZ could cause a temporary hypercalcemia with initiation or change in dose but that typically would normalize.  - No intervention at this time   - Continue to stay hydrated  - AVOID Over the counter calcium - Maintain 2-3 servings of calcium a day    2. Hyperthyroidism :   - She was seen Dr. Loanne Drilling in the past, this was thought to be secondary to nodules - No TRAB in the past  - We discussed other options of treatment such as proceeding with thyroid uptake and scan followed by ablation as the preferred long term treatment with toxic nodules. - We discussed side effects of methimazole such as including  the chance of rash, the small chance of liver irritation/juandice and the <=1 in 300-400 chance of sudden onset agranulocytosis.  - Pt is happy with current regimen and does not wish to change at this time   Medications : Continue methimazole 5 mg three times a week    F/u in 3 months   Signed electronically by: Mack Guise, MD  [facility]  [facility] [address] [place],  [address] Phone: [phone] FAX: [phone]   CC: Anna Moore, [place] [address] Phone: [phone] Fax: [phone]   Return to Endocrinology clinic as below: No future appointments.

## 2018-10-17 NOTE — Patient Instructions (Signed)
-   Continue to stay hydrated  - AVOID Over the counter calcium - Maintain 2-3 servings of calcium a day

## 2018-10-20 LAB — PTH, INTACT AND CALCIUM
Calcium: 10.1 mg/dL (ref 8.6–10.4)
PTH: 39 pg/mL (ref 14–64)

## 2018-10-23 ENCOUNTER — Encounter: Payer: Self-pay | Admitting: Family Medicine

## 2018-10-29 NOTE — Telephone Encounter (Signed)
Will ask Dawn to check into this. I look like an order but will have Dawn check.

## 2018-11-11 ENCOUNTER — Other Ambulatory Visit: Payer: Self-pay | Admitting: Family Medicine

## 2018-11-29 ENCOUNTER — Encounter: Payer: Self-pay | Admitting: Family Medicine

## 2018-12-01 ENCOUNTER — Telehealth: Payer: Self-pay

## 2018-12-01 NOTE — Telephone Encounter (Signed)
LMOV returning patient call from [employer].

## 2019-01-21 ENCOUNTER — Other Ambulatory Visit: Payer: Self-pay

## 2019-01-23 ENCOUNTER — Ambulatory Visit (INDEPENDENT_AMBULATORY_CARE_PROVIDER_SITE_OTHER): Payer: No Typology Code available for payment source | Admitting: Internal Medicine

## 2019-01-23 ENCOUNTER — Encounter: Payer: Self-pay | Admitting: Internal Medicine

## 2019-01-23 VITALS — BP 122/70 | HR 97 | Resp 16 | Ht 66.0 in | Wt 134.8 lb

## 2019-01-23 DIAGNOSIS — L604 Beau's lines: Secondary | ICD-10-CM

## 2019-01-23 DIAGNOSIS — E0590 Thyrotoxicosis, unspecified without thyrotoxic crisis or storm: Secondary | ICD-10-CM | POA: Diagnosis not present

## 2019-01-23 LAB — T4, FREE: Free T4: 1.18 ng/dL (ref 0.60–1.60)

## 2019-01-23 LAB — TSH: TSH: 0.75 u[IU]/mL (ref 0.35–4.50)

## 2019-01-23 NOTE — Progress Notes (Signed)
Name: Anna Moore  MRN/ DOB: [id number], 04-19-1955    Age/ Sex: 63 y.o., female     PCP: Lucille Passy, MD   Reason for Endocrinology Evaluation: Hyperthyroidism     Initial Endocrinology Clinic Visit: 10/17/2018    PATIENT IDENTIFIER: Anna Moore is a 63 y.o., female with a past medical history of HTN, hyperthyroidism and DJD. She has followed with [facility] since 10/17/2018 for consultative assistance with management of her hyperthyroidism.   THYROID HISTORY: Pt has been diagnosed with hyperthyroidism since 2015. She has been on Methimazole since 2015. She was seen Dr. Loanne Drilling in the past, this was thought to be secondary to nodules. Pt declined thyroid uptake and scan followed by RAI ablation and opted to stay on methimazole.   SUBJECTIVE:   During last visit (10/17/2018): Pt opted to continue with methimazole.   Today (01/23/2019):  [name] is here for a follow up on hyperthyroidism.   She has intentional weight loss.   She has noted fullness at the anterior neck which has affected her eating, as well as left neck pain that radiates towards to her ear. She has occasional nausea.   She has noted new grooves to the nails , with brittle nails. She has an upcoming appointment with a dermatologist next month  She has cold intolerance only during the day and has night sweats that she attributes to menopause    ROS:  As per HPI.   HISTORY:  Past Medical History:  Past Medical History:  Diagnosis Date  . Anxiety   . Cough    Questionably ACE related  . DJD (degenerative joint disease)    Of the back  . Fatigue    Significant fatigue in the setting of back pain and diaphoresis  . Fatigue   . Forgetfulness   . History of tobacco abuse   . Hot flashes   . Hypertension   . Palpitations    Past Surgical History:  Past Surgical History:  Procedure Laterality Date  . ABDOMINAL HYSTERECTOMY    . BACK SURGERY  01/2008   On L4 and  L5  . NASAL SEPTUM SURGERY    . OTHER SURGICAL HISTORY  1998   Hysterectomy  . PAROTIDECTOMY  1990    Social History:  reports that she quit smoking about 2 years ago. Her smoking use included cigarettes. She has never used smokeless tobacco. She reports current alcohol use of about 6.0 standard drinks of alcohol per week. She reports that she does not use drugs. Family History:  Family History  Problem Relation Age of Onset  . Arrhythmia Mother   . Hypertension Father   . Heart disease Father   . Heart attack Father        52's  . Cancer Father        non hodgkins lymphoma  . Colon cancer Neg Hx   . Thyroid disease Neg Hx      HOME MEDICATIONS: Allergies as of 01/23/2019   No Known Allergies     Medication List       Accurate as of January 23, 2019  4:42 PM. If you have any questions, ask your nurse or doctor.        acetaminophen 500 MG tablet Commonly known as: TYLENOL Take 500 mg by mouth every 6 (six) hours as needed (pain, fever).   ALPRAZolam 0.25 MG tablet Commonly known as: XANAX Take 1 tablet (0.25 mg total) by mouth 2 (two) times daily  as needed. for anxiety   amLODipine 10 MG tablet Commonly known as: NORVASC Take 1 tablet by mouth once daily   estradiol 1 MG tablet Commonly known as: ESTRACE Take 1 tablet by mouth twice daily   hydrochlorothiazide 25 MG tablet Commonly known as: HYDRODIURIL Take 1 tablet by mouth once daily   methimazole 5 MG tablet Commonly known as: TAPAZOLE TAKE 1 TABLET BY MOUTH THREE TIMES A WEEK   zolpidem 5 MG tablet Commonly known as: AMBIEN Take 1 tablet (5 mg total) by mouth at bedtime as needed. for sleep         OBJECTIVE:   PHYSICAL EXAM: VS: BP 122/70   Pulse 97   Resp 16   Ht 5\' 6"  (1.676 m)   Wt 134 lb 12.8 oz (61.1 kg)   SpO2 98%   BMI 21.76 kg/m    EXAM: General: Pt appears well and is in NAD  Neck: General: Supple without adenopathy. Thyroid: Thyroid size normal.  No goiter or nodules  appreciated. No thyroid bruit.  Lungs: Clear with good BS bilat with no rales, rhonchi, or wheezes  Heart: Auscultation: RRR.  Abdomen: Normoactive bowel sounds, soft, nontender, without masses or organomegaly palpable  Extremities: BL UE: Pt with transverse grooves in her nails , all right fingers and 2 of the left fingers BL LE: No pretibial edema   Neuro: Cranial nerves: II - XII grossly intact  Motor: Normal strength throughout DTRs: 2+ and symmetric in UE without delay in relaxation phase  Mental Status: Judgment, insight: Intact Orientation: Oriented to time, place, and person Mood and affect: No depression, anxiety, or agitation     DATA REVIEWED:  Results for NOHA, SHIPPS (MRN [id number]) as of 01/23/2019 16:42  Ref. Range 01/23/2019 11:05  TSH Latest Ref Range: 0.35 - 4.50 uIU/mL 0.75  T4,Free(Direct) Latest Ref Range: 0.60 - 1.60 ng/dL 1.18    ASSESSMENT / PLAN / RECOMMENDATIONS:   1. Hyperthyroidism :   - She was seen Dr. Loanne Drilling in the past, this was thought to be secondary to nodules - No TRAB in the past  - We discussed other options of treatment such as proceeding with thyroid uptake and scan followed by ablation as the preferred long term treatment with toxic nodules due to the side effects of methimazole but the patient has declined this option and has opted to continue on methimazole.  - Will obtain a thyroid ultrasound due to local neck symptoms   Medications   Methimazole 5 mg three times weekly    2. Transverse Nail Grooves:   - I am unsure of the cause. She has an upcoming appointment with dermatology. She was advised to remove nail polish prior to her visit to dermatology.    F/U in 6 months     Signed electronically by: Mack Guise, MD  [facility]  [address] [address] [place], [place] [address] Phone: [phone] FAX: [phone]      CC: Lucille Passy, [place] [address] Phone: [phone]  Fax: [phone]   Return to Endocrinology clinic as below: Future Appointments  Date Time Provider [place]  07/28/2019 10:30 AM Breane Grunwald, Melanie Crazier, MD [id number] None

## 2019-01-23 NOTE — Patient Instructions (Addendum)
-   Continue Methimazole three tablets weekly - I have ordered a thyroid ultrasound, if you don;t hear anything about it in 3 weeks, please call our office.

## 2019-02-02 ENCOUNTER — Telehealth: Payer: Self-pay | Admitting: Family Medicine

## 2019-02-02 ENCOUNTER — Telehealth (INDEPENDENT_AMBULATORY_CARE_PROVIDER_SITE_OTHER): Payer: No Typology Code available for payment source | Admitting: Family Medicine

## 2019-02-02 ENCOUNTER — Other Ambulatory Visit: Payer: No Typology Code available for payment source

## 2019-02-02 ENCOUNTER — Encounter: Payer: Self-pay | Admitting: Family Medicine

## 2019-02-02 VITALS — Temp 97.9°F

## 2019-02-02 DIAGNOSIS — N390 Urinary tract infection, site not specified: Secondary | ICD-10-CM

## 2019-02-02 DIAGNOSIS — R350 Frequency of micturition: Secondary | ICD-10-CM

## 2019-02-02 DIAGNOSIS — R300 Dysuria: Secondary | ICD-10-CM | POA: Diagnosis not present

## 2019-02-02 LAB — URINALYSIS, ROUTINE W REFLEX MICROSCOPIC
Bilirubin Urine: NEGATIVE
Hgb urine dipstick: NEGATIVE
Ketones, ur: NEGATIVE
Leukocytes,Ua: NEGATIVE
Nitrite: NEGATIVE
RBC / HPF: NONE SEEN (ref 0–?)
Specific Gravity, Urine: 1.02 (ref 1.000–1.030)
Total Protein, Urine: NEGATIVE
Urine Glucose: NEGATIVE
Urobilinogen, UA: 0.2 (ref 0.0–1.0)
pH: 7 (ref 5.0–8.0)

## 2019-02-02 MED ORDER — CEPHALEXIN 500 MG PO CAPS: 500.0000 mg | ORAL_CAPSULE | Freq: Two times a day (BID) | ORAL | 0 refills | Status: AC

## 2019-02-02 NOTE — Assessment & Plan Note (Addendum)
New- symptoms atypical but consistent with previous UTIs. Reviewed previous urine cx- most recently from 2/20 which grew E coli resistant to cipro but sensitive to keflex and other rxs.  Since she is having some nausea and back pain, I prefer to not repeat macrobid as it does not have great penetration in the kidneys- more for cystitis than pyelonephritis which needs to be on our radar.  The patient indicates understanding of these issues and agrees with the plan. She will come in now for urine sample and then pick up eRx sent for kelfex 500 mg twice daily x 7s but not start it until after she leaves her urine symptoms. Reviewed supportive care and red flags that should prompt return.  Orders Placed This Encounter  Procedures  . Urine Culture  . Urinalysis, Routine w reflex microscopic

## 2019-02-02 NOTE — Progress Notes (Signed)
Virtual Visit via Video   Due to the COVID-19 pandemic, this visit was completed with telemedicine (audio/video) technology to reduce patient and provider exposure as well as to preserve personal protective equipment.   I connected with Imogen Magill by a video enabled telemedicine application and verified that I am speaking with the correct person using two identifiers. Location patient: Home Location provider: [facility], Office Persons participating in the virtual visit: Shellee Milo, Arnette Norris, MD   I discussed the limitations of evaluation and management by telemedicine and the availability of in person appointments. The patient expressed understanding and agreed to proceed.  Care Team   Patient Care Team: Lucille Passy, MD as PCP - General (Family Medicine)  Subjective:   HPI:   Urinary frequency- Increased urinary frequency without dysuria for 3 days.  She had upper back pain, lips peeling and a hottness without fever, a real full feeling like she does when she has a UTI.   She is nauseated and having some back pain.  She has atypical symptoms. every time she has a UTI. She has been extremely fatigued- aying around which is not like her. Last UTI in Feb with E. Coli which abx had to be changed to Macrobid due to resistence to Cipro.  Component 58mo ago  MICRO NUMBER: [id number]   SPECIMEN QUALITY: Adequate   Sample Source URINE   STATUS: FINAL   ISOLATE 1: Escherichia coliAbnormal    Comment: 10,000-50,000 CFU/mL of Escherichia coli  Resulting Agency [facility]  Susceptibility   Escherichia coli    URINE CULTURE, REFLEX    AMOX/CLAVULANIC <=2  Sensitive    AMPICILLIN 4  Sensitive    AMPICILLIN/SULBACTAM <=2  Sensitive    CEFAZOLIN <=4  Not Reportable1    CEFEPIME <=1  Sensitive    CEFTRIAXONE <=1  Sensitive    CIPROFLOXACIN >=4  Resistant    ERTAPENEM <=0.5  Sensitive    GENTAMICIN <=1  Sensitive    IMIPENEM <=0.25  Sensitive    LEVOFLOXACIN >=8  Resistant     NITROFURANTOIN <=16  Sensitive    PIP/TAZO <=4  Sensitive    TOBRAMYCIN <=1  Sensitive    TRIMETH/SULFA <=20  Sensitive2        Patient Active Problem List   Diagnosis Date Noted  . Urinary frequency 02/02/2019  . Transverse groove in nails 01/23/2019  . Hypercalcemia 10/20/2018  . Serum calcium elevated 09/09/2018  . Hyperglycemia 08/07/2017  . Colon polyps 08/07/2017  . Panic attacks 10/11/2016  . Dehydration 04/12/2016  . Postmenopausal HRT (hormone replacement therapy) 07/28/2014  . Hyperlipidemia 05/20/2014  . Hyperthyroidism 09/22/2013  . Routine general medical examination at a health care facility 07/16/2013  . Insomnia 07/16/2013  . Post menopausal problems 04/30/2012  . Tobacco abuse 04/30/2012  . Eczema, dyshidrotic 04/30/2012  . Hypertension   . Anxiety   . DJD (degenerative joint disease)     Social History   Tobacco Use  . Smoking status: Former Smoker    Types: Cigarettes    Quit date: 04/08/2016    Years since quitting: 2.8  . Smokeless tobacco: Never Used  Substance Use Topics  . Alcohol use: Yes    Alcohol/week: 6.0 standard drinks    Types: 6 Cans of beer per week    Comment: 5 beers a week    Current Outpatient Medications:  .  acetaminophen (TYLENOL) 500 MG tablet, Take 500 mg by mouth every 6 (six) hours as needed (pain, fever)., Disp: ,  Rfl:  .  ALPRAZolam (XANAX) 0.25 MG tablet, Take 1 tablet (0.25 mg total) by mouth 2 (two) times daily as needed. for anxiety, Disp: 30 tablet, Rfl: 5 .  amLODipine (NORVASC) 10 MG tablet, Take 1 tablet by mouth once daily, Disp: 30 tablet, Rfl: 5 .  estradiol (ESTRACE) 1 MG tablet, Take 1 tablet by mouth twice daily, Disp: 60 tablet, Rfl: 5 .  hydrochlorothiazide (HYDRODIURIL) 25 MG tablet, Take 1 tablet by mouth once daily, Disp: 90 tablet, Rfl: 0 .  methimazole (TAPAZOLE) 5 MG tablet, TAKE 1 TABLET BY MOUTH THREE TIMES A WEEK, Disp: 90 tablet, Rfl: 1 .  zolpidem (AMBIEN) 5 MG tablet, Take 1 tablet (5 mg  total) by mouth at bedtime as needed. for sleep, Disp: 30 tablet, Rfl: 5 .  cephALEXin (KEFLEX) 500 MG capsule, Take 1 capsule (500 mg total) by mouth 2 (two) times daily for 7 days., Disp: 14 capsule, Rfl: 0  No Known Allergies  Objective:  Temp 97.9 F (36.6 C) (Oral)   VITALS: Per patient if applicable, see vitals. GENERAL: Alert, appears well and in no acute distress. HEENT: Atraumatic, conjunctiva clear, no obvious abnormalities on inspection of external nose and ears. NECK: Normal movements of the head and neck. CARDIOPULMONARY: No increased WOB. Speaking in clear sentences. I:E ratio WNL.  MS: Moves all visible extremities without noticeable abnormality. PSYCH: Pleasant and cooperative, well-groomed. Speech normal rate and rhythm. Affect is appropriate. Insight and judgement are appropriate. Attention is focused, linear, and appropriate.  NEURO: CN grossly intact. Oriented as arrived to appointment on time with no prompting. Moves both UE equally.  SKIN: No obvious lesions, wounds, erythema, or cyanosis noted on face or hands.  Depression screen [facility] 2/9 08/27/2018 08/07/2017 08/06/2016  Decreased Interest 0 0 0  Down, Depressed, Hopeless 0 0 0  PHQ - 2 Score 0 0 0    Assessment and Plan:   Corrinne was seen today for urinary frequency.  Diagnoses and all orders for this visit:  Urinary frequency -     Urinalysis, Routine w reflex microscopic -     Urine Culture  Other orders -     cephALEXin (KEFLEX) 500 MG capsule; Take 1 capsule (500 mg total) by mouth 2 (two) times daily for 7 days.    Marland Kitchen COVID-19 Education: The signs and symptoms of COVID-19 were discussed with the patient and how to seek care for testing if needed. The importance of social distancing was discussed today. . Reviewed expectations re: course of current medical issues. . Discussed self-management of symptoms. . Outlined signs and symptoms indicating need for more acute intervention. . Patient verbalized  understanding and all questions were answered. Marland Kitchen Health Maintenance issues including appropriate healthy diet, exercise, and smoking avoidance were discussed with patient. . See orders for this visit as documented in the electronic medical record.  Arnette Norris, MD  Records requested if needed. Time spent: 25 minutes, of which >50% was spent in obtaining information about her symptoms, reviewing her previous labs, evaluations, and treatments, counseling her about her condition (please see the discussed topics above), and developing a plan to further investigate it; she had a number of questions which I addressed.     Review of Systems  Constitutional: Positive for diaphoresis and malaise/fatigue. Negative for fever.  HENT: Negative.  Negative for congestion and hearing loss.   Eyes: Negative.  Negative for blurred vision, discharge and redness.  Respiratory: Negative.  Negative for cough and shortness of breath.  Cardiovascular: Negative for chest pain, palpitations and leg swelling.  Gastrointestinal: Positive for nausea. Negative for abdominal pain, heartburn and vomiting.  Genitourinary: Positive for frequency and urgency. Negative for dysuria, flank pain and hematuria.  Musculoskeletal: Positive for back pain. Negative for falls.  Skin: Negative for rash.  Neurological: Negative.  Negative for loss of consciousness and headaches.  Endo/Heme/Allergies: Does not bruise/bleed easily.  Psychiatric/Behavioral: Negative for depression.  All other systems reviewed and are negative.           Assessment & Plan:   Problem List Items Addressed This Visit      Active Problems   Urinary frequency    New- symptoms atypical but consistent with previous UTIs. Reviewed previous urine cx- most recently from 2/20 which grew E coli resistant to cipro but sensitive to keflex and other rxs.  Since she is having some nausea and back pain, I prefer to not repeat macrobid as it does not have great  penetration in the kidneys- more for cystitis than pyelonephritis which needs to be on our radar.  The patient indicates understanding of these issues and agrees with the plan. She will come in now for urine sample and then pick up eRx sent for kelfex 500 mg twice daily x 7s but not start it until after she leaves her urine symptoms. Reviewed supportive care and red flags that should prompt return.  Orders Placed This Encounter  Procedures  . Urine Culture  . Urinalysis, Routine w reflex microscopic          Relevant Orders   Urinalysis, Routine w reflex microscopic   Urine Culture      I am having Tippi Cassin start on cephALEXin. I am also having her maintain her acetaminophen, methimazole, ALPRAZolam, zolpidem, estradiol, amLODipine, and hydrochlorothiazide.  Meds ordered this encounter  Medications  . cephALEXin (KEFLEX) 500 MG capsule    Sig: Take 1 capsule (500 mg total) by mouth 2 (two) times daily for 7 days.    Dispense:  14 capsule    Refill:  0     Arnette Norris, MD

## 2019-02-04 LAB — URINE CULTURE
MICRO NUMBER:: 1146016
Result:: NO GROWTH
SPECIMEN QUALITY:: ADEQUATE

## 2019-02-05 NOTE — Telephone Encounter (Signed)
error 

## 2019-02-08 ENCOUNTER — Other Ambulatory Visit: Payer: Self-pay | Admitting: Family Medicine

## 2019-02-11 ENCOUNTER — Ambulatory Visit
Admission: RE | Admit: 2019-02-11 | Discharge: 2019-02-11 | Disposition: A | Discharge status: Home / Self Care | Payer: No Typology Code available for payment source | Source: Ambulatory Visit | Attending: Internal Medicine | Admitting: Internal Medicine

## 2019-02-11 DIAGNOSIS — E0590 Thyrotoxicosis, unspecified without thyrotoxic crisis or storm: Secondary | ICD-10-CM

## 2019-02-12 ENCOUNTER — Other Ambulatory Visit: Payer: Self-pay

## 2019-02-12 NOTE — Addendum Note (Signed)
Addended by: Lynnea Ferrier on: 02/12/2019 02:43 PM   Modules accepted: Orders

## 2019-02-13 ENCOUNTER — Other Ambulatory Visit (INDEPENDENT_AMBULATORY_CARE_PROVIDER_SITE_OTHER): Payer: No Typology Code available for payment source

## 2019-02-13 DIAGNOSIS — N390 Urinary tract infection, site not specified: Secondary | ICD-10-CM

## 2019-02-13 DIAGNOSIS — R350 Frequency of micturition: Secondary | ICD-10-CM | POA: Diagnosis not present

## 2019-02-13 LAB — URINALYSIS, ROUTINE W REFLEX MICROSCOPIC
Bilirubin Urine: NEGATIVE
Hgb urine dipstick: NEGATIVE
Ketones, ur: NEGATIVE
Leukocytes,Ua: NEGATIVE
Nitrite: NEGATIVE
Specific Gravity, Urine: 1.01 (ref 1.000–1.030)
Total Protein, Urine: NEGATIVE
Urine Glucose: NEGATIVE
Urobilinogen, UA: 0.2 (ref 0.0–1.0)
pH: 7 (ref 5.0–8.0)

## 2019-02-14 LAB — URINE CULTURE
MICRO NUMBER:: 1189496
SPECIMEN QUALITY:: ADEQUATE

## 2019-03-03 ENCOUNTER — Other Ambulatory Visit: Payer: Self-pay | Admitting: Family Medicine

## 2019-03-03 ENCOUNTER — Other Ambulatory Visit: Payer: Self-pay

## 2019-03-09 ENCOUNTER — Other Ambulatory Visit: Payer: Self-pay | Admitting: Family Medicine

## 2019-03-09 NOTE — Telephone Encounter (Signed)
Last OV 02/02/19 Last fill for both 09/12/18

## 2019-03-25 ENCOUNTER — Other Ambulatory Visit: Payer: Self-pay | Admitting: Family Medicine

## 2019-03-25 NOTE — Telephone Encounter (Signed)
TA-Plz see refill req/LOV in Nov/per [place] PMP pt is compliant without red flags/thx dmf

## 2019-03-29 ENCOUNTER — Other Ambulatory Visit: Payer: Self-pay | Admitting: Family Medicine

## 2019-03-30 NOTE — Telephone Encounter (Signed)
Last OV 02/02/19 Last fill 03/03/19  #12/0

## 2019-04-08 ENCOUNTER — Other Ambulatory Visit: Payer: Self-pay | Admitting: Family Medicine

## 2019-04-08 NOTE — Telephone Encounter (Signed)
TA-Plz see refill req/thx dmf

## 2019-04-20 ENCOUNTER — Telehealth: Payer: Self-pay

## 2019-04-20 NOTE — Telephone Encounter (Signed)
Appointment needs to be moved to 8:45 as a new pt or TOC can not be the 1st slot of the day--- if pt can not make 8:45, need to r/s

## 2019-04-27 ENCOUNTER — Encounter: Payer: No Typology Code available for payment source | Admitting: Internal Medicine

## 2019-04-30 ENCOUNTER — Encounter: Payer: Self-pay | Admitting: Internal Medicine

## 2019-04-30 ENCOUNTER — Other Ambulatory Visit: Payer: Self-pay

## 2019-04-30 ENCOUNTER — Ambulatory Visit (INDEPENDENT_AMBULATORY_CARE_PROVIDER_SITE_OTHER): Payer: No Typology Code available for payment source | Admitting: Internal Medicine

## 2019-04-30 DIAGNOSIS — E0590 Thyrotoxicosis, unspecified without thyrotoxic crisis or storm: Secondary | ICD-10-CM | POA: Diagnosis not present

## 2019-04-30 DIAGNOSIS — F5104 Psychophysiologic insomnia: Secondary | ICD-10-CM

## 2019-04-30 DIAGNOSIS — F410 Panic disorder [episodic paroxysmal anxiety] without agoraphobia: Secondary | ICD-10-CM

## 2019-04-30 DIAGNOSIS — M8949 Other hypertrophic osteoarthropathy, multiple sites: Secondary | ICD-10-CM

## 2019-04-30 DIAGNOSIS — M159 Polyosteoarthritis, unspecified: Secondary | ICD-10-CM

## 2019-04-30 DIAGNOSIS — F419 Anxiety disorder, unspecified: Secondary | ICD-10-CM

## 2019-04-30 DIAGNOSIS — I10 Essential (primary) hypertension: Secondary | ICD-10-CM | POA: Diagnosis not present

## 2019-04-30 DIAGNOSIS — E7800 Pure hypercholesterolemia, unspecified: Secondary | ICD-10-CM

## 2019-04-30 DIAGNOSIS — Z79890 Hormone replacement therapy (postmenopausal): Secondary | ICD-10-CM

## 2019-04-30 MED ORDER — METHIMAZOLE 5 MG PO TABS: 5.0000 mg | ORAL_TABLET | ORAL | 2 refills | Status: DC

## 2019-04-30 MED ORDER — HYDROCHLOROTHIAZIDE 25 MG PO TABS: 25.0000 mg | ORAL_TABLET | Freq: Every day | ORAL | 2 refills | Status: DC

## 2019-04-30 NOTE — Assessment & Plan Note (Signed)
Continue Gabapentin She will continue to follow with ortho

## 2019-04-30 NOTE — Assessment & Plan Note (Addendum)
Continue Xanax prn Will get CSA and UDS at next visit

## 2019-04-30 NOTE — Progress Notes (Signed)
HPI  Patient presents to the clinic today to establish care and for management of the conditions listed below.  She is transferring care from Dr. Deborra Medina.  Anxiety with Panic Attack/Insomnia: Managed with Xanax as needed, 3 x week.  She takes Ambien as needed for sleep, about 1 -2 times per week.  She does not follow with a therapist.  She denies depression, SI/HI.  OA: s/p L4-L5. Mainly in her back. She takes Gabapentin as needed with good relief. She follows with Dr. Rolena Infante.   HTN: Her BP today is 126/82.  She is taking Amlodipine and HCTZ as prescribed.  ECG from 10/2016 reviewed.  Hyperthyroidism: Managed on Methimazole.  She no longer follows with endocrinology.  HLD: Her last LDL was 144, 08/2018.  She is not taking any cholesterol-lowering medication at this time.  She tries to consume a low-fat diet.  Postmenopausal Hot Flashes/Night Sweats: Controlled on Estrace.  Flu: 12/2018 Tetanus: 08/2017 Shingrix: 06/2017, 08/2017 Covid: never Pap smear: Hysterectomy Mammogram: 10/2018 Bone density: Never Colon screening: 05/2012, 3 years Vision screening: annually Dentist: biannually  Past Medical History:  Diagnosis Date  . Anxiety   . DJD (degenerative joint disease)    Of the back  . Hypertension      Current Outpatient Medications  Medication Sig Dispense Refill  . ALPRAZolam (XANAX) 0.25 MG tablet Take 1 tablet by mouth twice daily as needed for anxiety 30 tablet 5  . amLODipine (NORVASC) 10 MG tablet Take 1 tablet by mouth once daily 90 tablet 1  . estradiol (ESTRACE) 1 MG tablet Take 1 tablet by mouth twice daily 180 tablet 1  . hydrochlorothiazide (HYDRODIURIL) 25 MG tablet Take 1 tablet by mouth once daily 90 tablet 1  . methimazole (TAPAZOLE) 5 MG tablet TAKE 1 TABLET BY MOUTH THREE TIMES A WEEK 12 tablet 0  . zolpidem (AMBIEN) 5 MG tablet TAKE 1 TABLET BY MOUTH AT BEDTIME AS NEEDED FOR SLEEP 90 tablet 1   No current facility-administered medications for this visit.     No Known Allergies  Family History  Problem Relation Age of Onset  . Arrhythmia Mother   . Hypertension Father   . Heart disease Father   . Heart attack Father        61's  . Cancer Father        non hodgkins lymphoma  . Colon cancer Neg Hx   . Thyroid disease Neg Hx     Social History   Socioeconomic History  . Marital status: Married    Spouse name: Not on file  . Number of children: Not on file  . Years of education: Not on file  . Highest education level: Not on file  Occupational History  . Not on file  Tobacco Use  . Smoking status: Former Smoker    Types: Cigarettes    Quit date: 04/08/2016    Years since quitting: 3.0  . Smokeless tobacco: Never Used  Substance and Sexual Activity  . Alcohol use: Yes    Alcohol/week: 6.0 standard drinks    Types: 6 Cans of beer per week    Comment: 5 beers a week  . Drug use: No  . Sexual activity: Not on file  Other Topics Concern  . Not on file  Social History Narrative   Married.  One daughter, 63 yo- moving back in with her.   Retired Medical illustrator.   Social Determinants of Health   Financial Resource Strain:   . Difficulty of Paying  Living Expenses: Not on file  Food Insecurity:   . Worried About Charity fundraiser in the Last Year: Not on file  . Ran Out of Food in the Last Year: Not on file  Transportation Needs:   . Lack of Transportation (Medical): Not on file  . Lack of Transportation (Non-Medical): Not on file  Physical Activity:   . Days of Exercise per Week: Not on file  . Minutes of Exercise per Session: Not on file  Stress:   . Feeling of Stress : Not on file  Social Connections:   . Frequency of Communication with Friends and Family: Not on file  . Frequency of Social Gatherings with Friends and Family: Not on file  . Attends Religious Services: Not on file  . Active Member of Clubs or Organizations: Not on file  . Attends Archivist Meetings: Not on file  . Marital Status: Not  on file  Intimate Partner Violence:   . Fear of Current or Ex-Partner: Not on file  . Emotionally Abused: Not on file  . Physically Abused: Not on file  . Sexually Abused: Not on file    ROS:  Constitutional: Denies fever, malaise, fatigue, headache or abrupt weight changes.  HEENT: Denies eye pain, eye redness, ear pain, ringing in the ears, wax buildup, runny nose, nasal congestion, bloody nose, or sore throat. Respiratory: Denies difficulty breathing, shortness of breath, cough or sputum production.   Cardiovascular: Denies chest pain, chest tightness, palpitations or swelling in the hands or feet.  Gastrointestinal: Denies abdominal pain, bloating, constipation, diarrhea or blood in the stool.  GU: Denies frequency, urgency, pain with urination, blood in urine, odor or discharge. Musculoskeletal: Pt reports chronic back pain. Denies decrease in range of motion, difficulty with gait, muscle pain or joint swelling.  Skin: Denies redness, rashes, lesions or ulcercations.  Neurological: Pt reports hot flashes, night sweats. Denies dizziness, difficulty with memory, difficulty with speech or problems with balance and coordination.  Psych: Pt reports anxiety. Denies depression, SI/HI.  No other specific complaints in a complete review of systems (except as listed in HPI above).  PE: BP 126/82   Pulse 71   Temp 97.6 F (36.4 C) (Temporal)   Wt 141 lb (64 kg)   SpO2 98%   BMI 22.76 kg/m   Wt Readings from Last 3 Encounters:  01/23/19 134 lb 12.8 oz (61.1 kg)  10/17/18 142 lb 12.8 oz (64.8 kg)  08/27/18 136 lb (61.7 kg)    General: Appears her stated age, well developed, well nourished in NAD. HEENT: Head: normal shape and size; Eyes: sclera white, no icterus, conjunctiva pink, PERRLA and EOMs intact;  Neck: Neck supple, trachea midline. No masses, lumps or thyromegaly present.  Cardiovascular: Normal rate and rhythm. S1,S2 noted.  No murmur, rubs or gallops noted. No JVD or BLE  edema.  Pulmonary/Chest: Normal effort and positive vesicular breath sounds. No respiratory distress.  Musculoskeletal:  No difficulty with gait.  Neurological: Alert and oriented.  Psychiatric: Mood and affect normal. Behavior is normal. Judgment and thought content normal.     BMET    Component Value Date/Time   NA 137 10/17/2018 1112   K 3.7 10/17/2018 1112   CL 100 10/17/2018 1112   CO2 29 10/17/2018 1112   GLUCOSE 96 10/17/2018 1112   BUN 13 10/17/2018 1112   CREATININE 0.59 10/17/2018 1112   CREATININE 0.66 08/27/2018 1108   CALCIUM 10.1 10/17/2018 1112   CALCIUM 9.8 10/17/2018 1112  GFRNONAA >60 10/08/2016 0949   GFRAA >60 10/08/2016 0949    Lipid Panel     Component Value Date/Time   CHOL 254 (H) 08/27/2018 1108   TRIG 128 08/27/2018 1108   HDL 85 08/27/2018 1108   CHOLHDL 3.0 08/27/2018 1108   VLDL 14.0 08/07/2017 1056   LDLCALC 144 (H) 08/27/2018 1108    CBC    Component Value Date/Time   WBC 11.0 (H) 08/27/2018 1108   RBC 4.93 08/27/2018 1108   HGB 15.6 (H) 08/27/2018 1108   HCT 45.3 (H) 08/27/2018 1108   PLT 391 08/27/2018 1108   MCV 91.9 08/27/2018 1108   MCH 31.6 08/27/2018 1108   MCHC 34.4 08/27/2018 1108   RDW 13.1 08/27/2018 1108   LYMPHSABS 2,640 08/27/2018 1108   MONOABS 0.9 04/18/2018 1043   EOSABS 66 08/27/2018 1108   BASOSABS 44 08/27/2018 1108    Hgb A1C Lab Results  Component Value Date   HGBA1C 5.4 08/27/2018     Assessment and Plan:

## 2019-04-30 NOTE — Patient Instructions (Signed)

## 2019-04-30 NOTE — Assessment & Plan Note (Signed)
Methimazole refilled today

## 2019-04-30 NOTE — Assessment & Plan Note (Signed)
Continue Xanax prn, avoid overuse Will get CSA and UDS at next visit

## 2019-04-30 NOTE — Assessment & Plan Note (Signed)
Controlled on Amlodipine and HCTZ HCTZ refilled today Will monitor

## 2019-04-30 NOTE — Assessment & Plan Note (Signed)
Continue Ambien prn

## 2019-04-30 NOTE — Assessment & Plan Note (Signed)
Encouraged low fat diet 

## 2019-04-30 NOTE — Assessment & Plan Note (Signed)
Will try to wean morning dose of Estrace. Take 1/2 tab daily x 1 week, then 1/2 tab every other day x 1 week, then stop. Continue evening dose

## 2019-06-15 ENCOUNTER — Ambulatory Visit (INDEPENDENT_AMBULATORY_CARE_PROVIDER_SITE_OTHER): Payer: No Typology Code available for payment source | Admitting: Internal Medicine

## 2019-06-15 ENCOUNTER — Encounter: Payer: Self-pay | Admitting: Internal Medicine

## 2019-06-15 ENCOUNTER — Other Ambulatory Visit: Payer: Self-pay

## 2019-06-15 VITALS — BP 128/84 | HR 101 | Temp 97.5°F | Wt 138.0 lb

## 2019-06-15 DIAGNOSIS — R5383 Other fatigue: Secondary | ICD-10-CM

## 2019-06-15 DIAGNOSIS — M545 Low back pain, unspecified: Secondary | ICD-10-CM

## 2019-06-15 DIAGNOSIS — R140 Abdominal distension (gaseous): Secondary | ICD-10-CM

## 2019-06-15 DIAGNOSIS — R000 Tachycardia, unspecified: Secondary | ICD-10-CM

## 2019-06-15 DIAGNOSIS — R1032 Left lower quadrant pain: Secondary | ICD-10-CM | POA: Diagnosis not present

## 2019-06-15 LAB — POC URINALSYSI DIPSTICK (AUTOMATED)
Bilirubin, UA: NEGATIVE
Blood, UA: NEGATIVE
Glucose, UA: NEGATIVE
Leukocytes, UA: NEGATIVE
Nitrite, UA: NEGATIVE
Protein, UA: POSITIVE — AB
Spec Grav, UA: 1.025 (ref 1.010–1.025)
Urobilinogen, UA: 0.2 E.U./dL
pH, UA: 6 (ref 5.0–8.0)

## 2019-06-15 MED ORDER — NITROFURANTOIN MONOHYD MACRO 100 MG PO CAPS: 100.0000 mg | ORAL_CAPSULE | Freq: Two times a day (BID) | ORAL | 0 refills | Status: DC

## 2019-06-15 NOTE — Patient Instructions (Signed)
[email or web address]

## 2019-06-15 NOTE — Progress Notes (Signed)
HPI  Pt presents to the clinic today with c/o left lower pelvic pain and low back pain. This started 3-4 days ago. She describes the pain as uncomfortable. She denies urinary or vaginal symptoms. She does have some abdominal bloating. She denies nausea, vomiting, constipation or diarrhea. She has not had a BM today. She denies blood in her stool. She reports some fatigue, chills, night sweats along with mild cough and SOB. She denies fever. She has not had her Covid vaccine. She has not tried anything OTC for her symptoms.   Review of Systems  Past Medical History:  Diagnosis Date  . Anxiety   . DJD (degenerative joint disease)    Of the back  . Hypertension     Family History  Problem Relation Age of Onset  . Atrial fibrillation Mother   . Hypertension Father   . Heart disease Father   . Heart attack Father        83's  . Cancer Father        non hodgkins lymphoma  . Diabetes Father   . Colon cancer Neg Hx   . Thyroid disease Neg Hx     Social History   Socioeconomic History  . Marital status: Married    Spouse name: Not on file  . Number of children: Not on file  . Years of education: Not on file  . Highest education level: Not on file  Occupational History  . Not on file  Tobacco Use  . Smoking status: Former Smoker    Types: Cigarettes    Quit date: 04/08/2016    Years since quitting: 3.1  . Smokeless tobacco: Never Used  Substance and Sexual Activity  . Alcohol use: Yes    Alcohol/week: 6.0 standard drinks    Types: 6 Cans of beer per week    Comment: 5 beers a week  . Drug use: No  . Sexual activity: Not on file  Other Topics Concern  . Not on file  Social History Narrative   Married.  One daughter, 38 yo- moving back in with her.   Retired Medical illustrator.   Social Determinants of Health   Financial Resource Strain:   . Difficulty of Paying Living Expenses:   Food Insecurity:   . Worried About Charity fundraiser in the Last Year:   . Academic librarian in the Last Year:   Transportation Needs:   . Film/video editor (Medical):   Marland Kitchen Lack of Transportation (Non-Medical):   Physical Activity:   . Days of Exercise per Week:   . Minutes of Exercise per Session:   Stress:   . Feeling of Stress :   Social Connections:   . Frequency of Communication with Friends and Family:   . Frequency of Social Gatherings with Friends and Family:   . Attends Religious Services:   . Active Member of Clubs or Organizations:   . Attends Archivist Meetings:   Marland Kitchen Marital Status:   Intimate Partner Violence:   . Fear of Current or Ex-Partner:   . Emotionally Abused:   Marland Kitchen Physically Abused:   . Sexually Abused:     No Known Allergies   Constitutional: Pt reports fatigue, chills.Denies fever, malaise, fatigue, headache or abrupt weight changes.  GI: Pt reports LLQ abdominal pain. Denies nausea, vomiting, constipation, diarrhea or blood in her stool.   GU: Denies urgency, frequency, dysuria, burning sensation, blood in urine, odor or discharge. Skin: Denies redness, rashes, lesions  or ulcercations.  MSK: Pt reports low back pain. Denies decrease in ROM, difficulty with gait.  No other specific complaints in a complete review of systems (except as listed in HPI above).    Objective:   Physical Exam  BP 128/84   Pulse (!) 101   Temp (!) 97.5 F (36.4 C) (Temporal)   Wt 138 lb (62.6 kg)   SpO2 98%   BMI 22.27 kg/m   Wt Readings from Last 3 Encounters:  04/30/19 141 lb (64 kg)  01/23/19 134 lb 12.8 oz (61.1 kg)  10/17/18 142 lb 12.8 oz (64.8 kg)    General: Appears her stated age, well developed, well nourished in NAD. Cardiovascular: Tachycardic with rhythm. S1,S2 noted.   Pulmonary/Chest: Normal effort and positive vesicular breath sounds. No respiratory distress. No wheezes, rales or ronchi noted.  Abdomen: Soft. Normal bowel sounds. No distention or masses noted.  Tender to palpation in the LLQ. No CVA tenderness.         Assessment & Plan:   LLQ Abdominal Pain, Low Back Pain, Tachycardia, Fatigue:  Urinalysis: 1+ protein, 2+ ketones Will send urine culture eRx sent if for Macrobid 100 mg BID x 5 days Drink plenty of fluids Would also like her to go get Covid tested  RTC as needed or if symptoms persist. Webb Silversmith, NP This visit occurred during the SARS-CoV-2 public health emergency.  Safety protocols were in place, including screening questions prior to the visit, additional usage of staff PPE, and extensive cleaning of exam room while observing appropriate contact time as indicated for disinfecting solutions.

## 2019-06-16 LAB — URINE CULTURE
MICRO NUMBER:: 10352084
Result:: NO GROWTH
SPECIMEN QUALITY:: ADEQUATE

## 2019-07-24 ENCOUNTER — Other Ambulatory Visit: Payer: Self-pay

## 2019-07-27 NOTE — Progress Notes (Deleted)
Name: Anna Moore  MRN/ DOB: [id number], 1955/05/14    Age/ Sex: 64 y.o., female     PCP: Jearld Fenton, NP   Reason for Endocrinology Evaluation: Hyperthyroidism     Initial Endocrinology Clinic Visit: 10/17/2018    PATIENT IDENTIFIER: [name] is a 64 y.o., female with a past medical history of HTN, hyperthyroidism and DJD. She has followed with [facility] since 10/17/2018 for consultative assistance with management of her hyperthyroidism.   THYROID HISTORY: Pt has been diagnosed with hyperthyroidism since 2015. She has been on Methimazole since 2015. She was seen Dr. Loanne Drilling in the past, this was thought to be secondary to nodules. Pt declined thyroid uptake and scan followed by RAI ablation and opted to stay on methimazole.    Thyroid ultrasound 02/2019 showed a left superior sub-centimeter nodule.   SUBJECTIVE:   During last visit (01/23/2019): Continued methimazole 5 mg three times weekly    Today (07/28/2019):  Ms. [name] is here for a follow up on hyperthyroidism.   She has intentional weight loss.    ROS:  As per HPI.   HISTORY:  Past Medical History:  Past Medical History:  Diagnosis Date  . Anxiety   . DJD (degenerative joint disease)    Of the back  . Hypertension    Past Surgical History:  Past Surgical History:  Procedure Laterality Date  . ABDOMINAL HYSTERECTOMY    . BACK SURGERY  01/2008   On L4 and L5  . NASAL SEPTUM SURGERY    . OTHER SURGICAL HISTORY  1998   Hysterectomy  . PAROTIDECTOMY  1990    Social History:  reports that she quit smoking about 3 years ago. Her smoking use included cigarettes. She has never used smokeless tobacco. She reports current alcohol use of about 6.0 standard drinks of alcohol per week. She reports that she does not use drugs. Family History:  Family History  Problem Relation Age of Onset  . Atrial fibrillation Mother   . Hypertension Father   . Heart disease Father   .  Heart attack Father        63's  . Cancer Father        non hodgkins lymphoma  . Diabetes Father   . Colon cancer Neg Hx   . Thyroid disease Neg Hx      HOME MEDICATIONS: Allergies as of 07/28/2019   No Known Allergies     Medication List       Accurate as of Jul 28, 2019  7:16 AM. If you have any questions, ask your nurse or doctor.        ALPRAZolam 0.25 MG tablet Commonly known as: XANAX Take 1 tablet by mouth twice daily as needed for anxiety   amLODipine 10 MG tablet Commonly known as: NORVASC Take 1 tablet by mouth once daily   estradiol 1 MG tablet Commonly known as: ESTRACE Take 1 tablet by mouth twice daily   gabapentin 300 MG capsule Commonly known as: NEURONTIN Take 1 capsule by mouth at bedtime as needed.   hydrochlorothiazide 25 MG tablet Commonly known as: HYDRODIURIL Take 1 tablet (25 mg total) by mouth daily.   methimazole 5 MG tablet Commonly known as: TAPAZOLE Take 1 tablet (5 mg total) by mouth 3 (three) times a week.   nitrofurantoin (macrocrystal-monohydrate) 100 MG capsule Commonly known as: MACROBID Take 1 capsule (100 mg total) by mouth 2 (two) times daily.   zolpidem 5 MG tablet Commonly known  as: AMBIEN TAKE 1 TABLET BY MOUTH AT BEDTIME AS NEEDED FOR SLEEP         OBJECTIVE:   PHYSICAL EXAM: VS: There were no vitals taken for this visit.   EXAM: General: Pt appears well and is in NAD  Neck: General: Supple without adenopathy. Thyroid: Thyroid size normal.  No goiter or nodules appreciated. No thyroid bruit.  Lungs: Clear with good BS bilat with no rales, rhonchi, or wheezes  Heart: Auscultation: RRR.  Abdomen: Normoactive bowel sounds, soft, nontender, without masses or organomegaly palpable  Extremities: BL UE: Pt with transverse grooves in her nails , all right fingers and 2 of the left fingers BL LE: No pretibial edema   Neuro: Cranial nerves: II - XII grossly intact  Motor: Normal strength throughout DTRs: 2+ and  symmetric in UE without delay in relaxation phase  Mental Status: Judgment, insight: Intact Orientation: Oriented to time, place, and person Mood and affect: No depression, anxiety, or agitation     DATA REVIEWED:  Results for KAILE, CARRERA (MRN [id number]) as of 01/23/2019 16:42  Ref. Range 01/23/2019 11:05  TSH Latest Ref Range: 0.35 - 4.50 uIU/mL 0.75  T4,Free(Direct) Latest Ref Range: 0.60 - 1.60 ng/dL 1.18    ASSESSMENT / PLAN / RECOMMENDATIONS:   1. Hyperthyroidism :   - She was seen Dr. Loanne Drilling in the past, this was thought to be secondary to nodules - No TRAB in the past  - We discussed other options of treatment such as proceeding with thyroid uptake and scan followed by ablation as the preferred long term treatment with toxic nodules due to the side effects of methimazole but the patient has declined this option and has opted to continue on methimazole.  - Will obtain a thyroid ultrasound due to local neck symptoms   Medications   Methimazole 5 mg three times weekly    2. Transverse Nail Grooves:   - I am unsure of the cause. She has an upcoming appointment with dermatology. She was advised to remove nail polish prior to her visit to dermatology.    F/U in 6 months     Signed electronically by: Mack Guise, MD  [facility]  [facility] [address] [place], [place] [address] Phone: [phone] FAX: [phone]      CC: Jearld Fenton, NP [place] [address] Phone: [phone]  Fax: [phone]   Return to Endocrinology clinic as below: Future Appointments  Date Time Provider [place]  07/28/2019 10:30 AM Weslee Prestage, Melanie Crazier, MD [facility] None

## 2019-07-28 ENCOUNTER — Ambulatory Visit: Payer: No Typology Code available for payment source | Admitting: Internal Medicine

## 2019-07-28 DIAGNOSIS — Z0289 Encounter for other administrative examinations: Secondary | ICD-10-CM

## 2019-09-12 IMAGING — CT CT ABD-PELV W/ CM
2 of 5 series · 16 of 46 positions shown, 18 images · IV contrast (ISOVUE 300)
Comparison: CTA abdomen and pelvis 09/23/2008.

CLINICAL DATA: 62-year-old with gradually worsening suprapubic and
LEFT LOWER QUADRANT abdominal pain which began approximately 1 week
ago, associated with anorexia, urinary frequency, nausea and
myalgias. Surgical history includes hysterectomy.

EXAM:
CT ABDOMEN AND PELVIS WITH CONTRAST
TECHNIQUE: Multidetector CT imaging of the abdomen and pelvis was performed
using the standard protocol following bolus administration of
intravenous contrast.
CONTRAST:  100mL [id number] IOPAMIDOL INJECTION 61% IV. Oral
contrast was also administered.

[Series 2: abd/pel w · axial · 0.60mm/px · z∈[+648,+1013]mm · 13 of 83 slices shown, 15 images]
[im 5/83  soft-tissue]
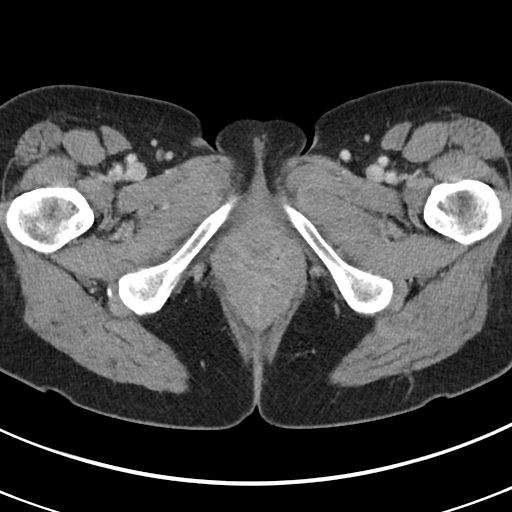
[im 5/83  bone]
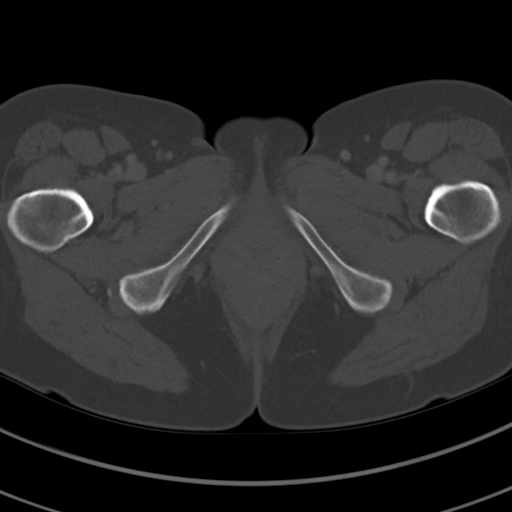
[im 13/83  soft-tissue]
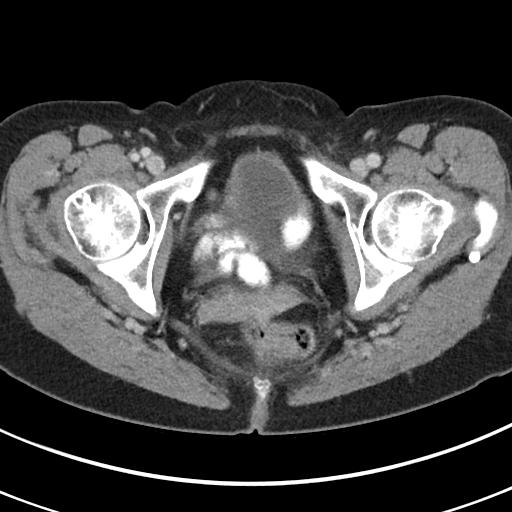
[im 17/83  soft-tissue]
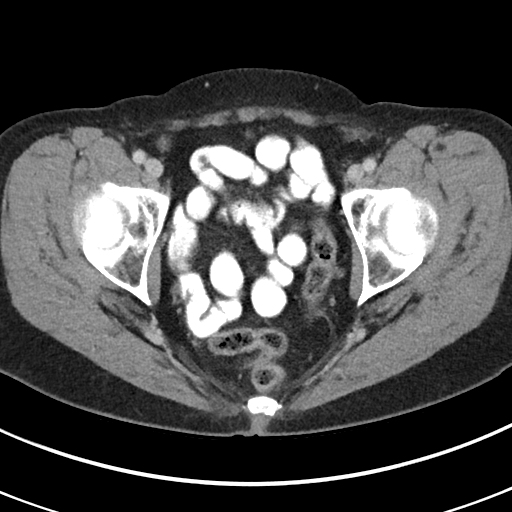
[im 25/83  soft-tissue]
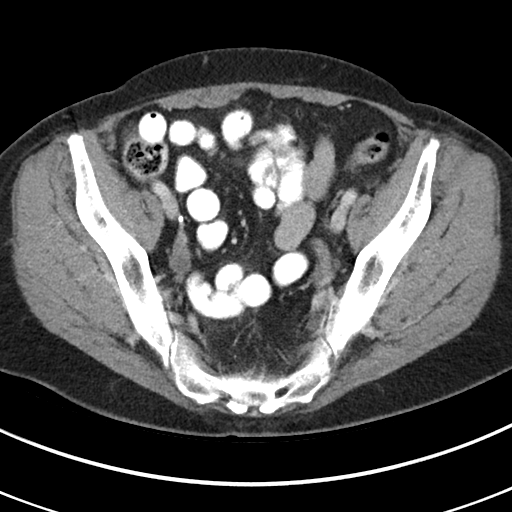
[im 29/83  soft-tissue]
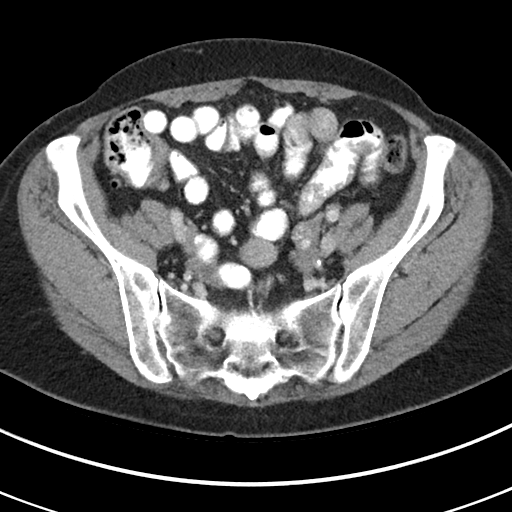
[im 37/83  soft-tissue]
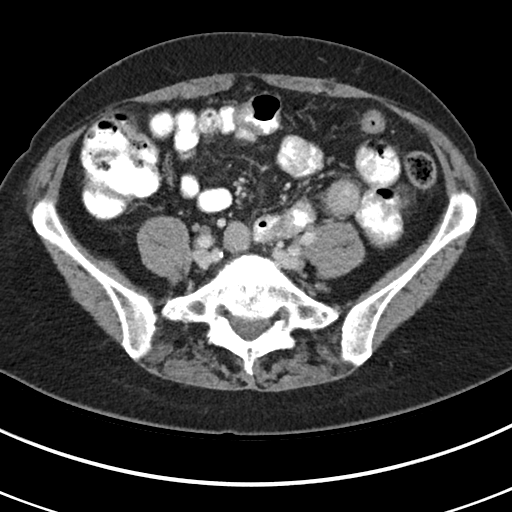
[im 42/83  soft-tissue]
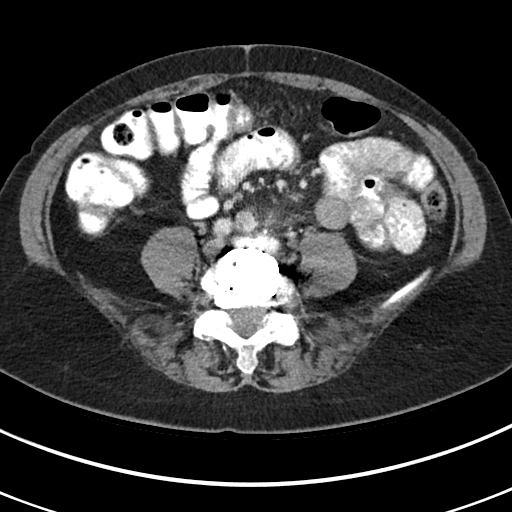
[im 46/83  soft-tissue]
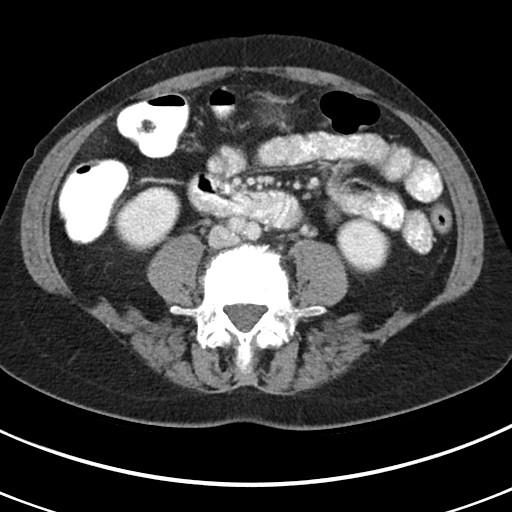
[im 54/83  soft-tissue]
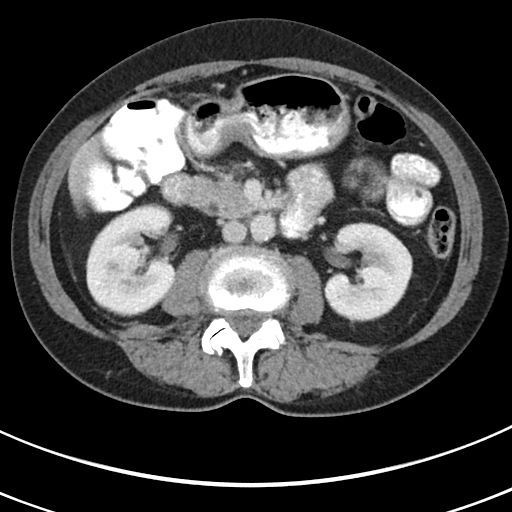
[im 54/83  bone]
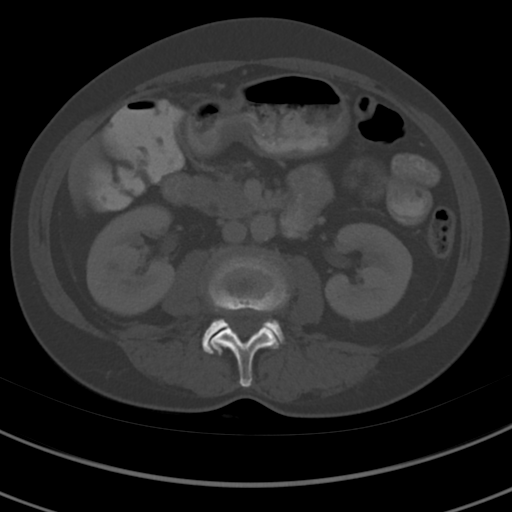
[im 58/83  soft-tissue]
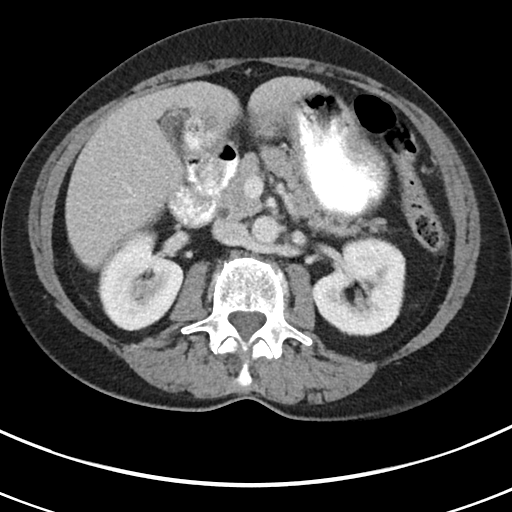
[im 66/83  soft-tissue]
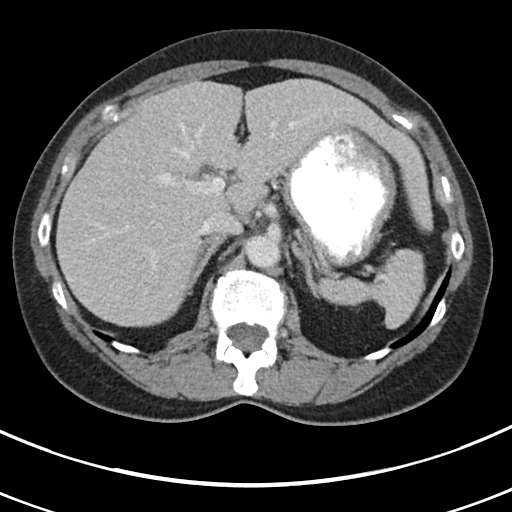
[im 70/83  soft-tissue]
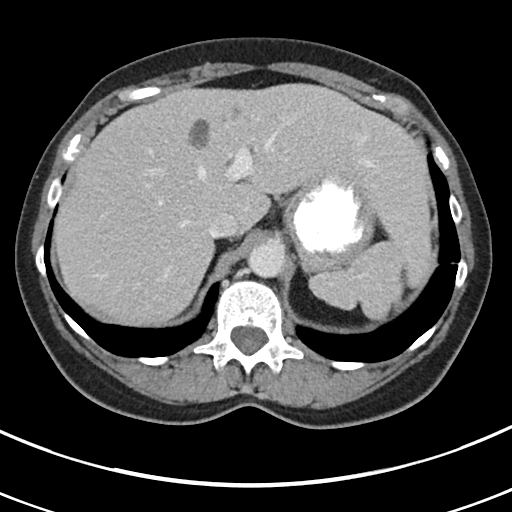
[im 78/83  soft-tissue]
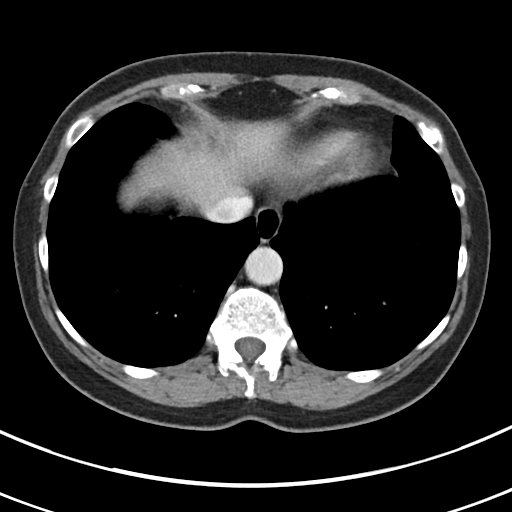

[Series 5: abd/pel w st · coronal · 0.72mm/px · 3 of 84 slices shown]
[im 28/84  soft-tissue]
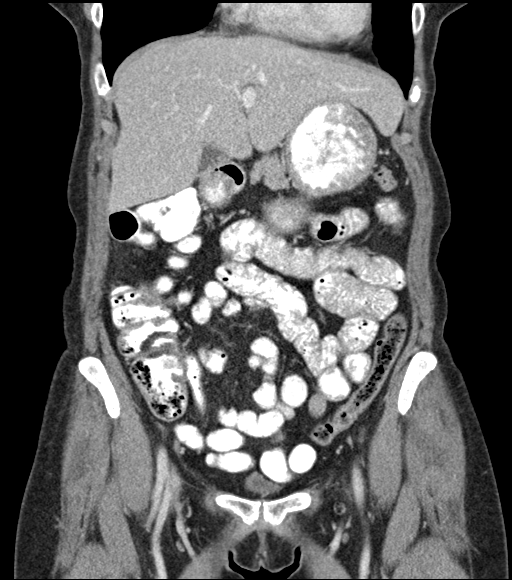
[im 37/84  soft-tissue]
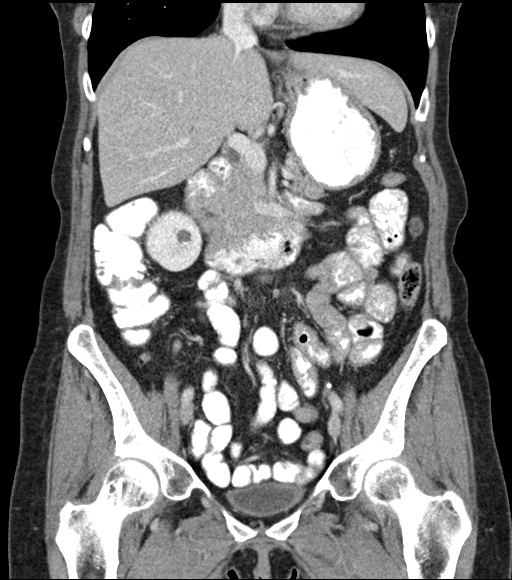
[im 47/84  soft-tissue]
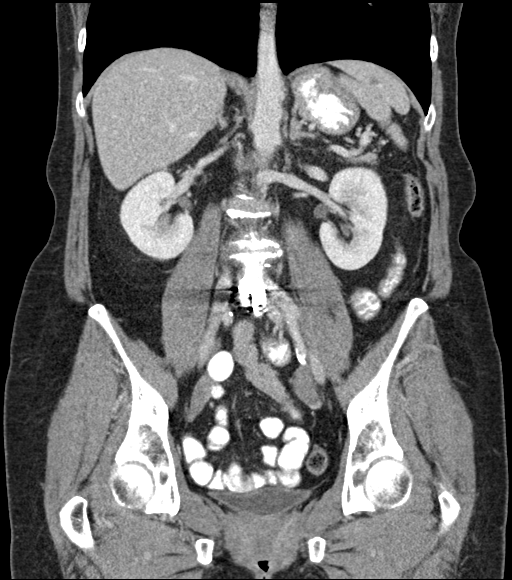

[16 of 46 positions shown; findings below may reference images not displayed]

FINDINGS: Lower chest: Minimal linear scarring involving the LEFT LOWER LOBE,
unchanged. Visualized lung bases otherwise clear. Stable normal
heart size.

Hepatobiliary: Multiple benign cysts throughout the liver as noted
on the prior CT. No solid liver masses. Anatomic variant in that the
LEFT lobe of the liver extends well across the midline into the LEFT
UPPER QUADRANT.

Possible polyps involving the gallbladder wall (versus adherent
non-dependent small noncalcified gallstones). No pericholecystic
inflammation. No biliary ductal dilation.

Pancreas: Normal in appearance without evidence of mass, ductal
dilation, or inflammation.

Spleen: Normal in size and appearance.

Adrenals/Urinary Tract: Normal appearing adrenal glands. Benign
cortical cysts involving both kidneys as noted previously. No
significant focal parenchymal abnormality involving either kidney.
No hydronephrosis. No urinary tract calculi. Normal appearing
decompressed urinary bladder.

Stomach/Bowel: Normal appearing stomach filled with food and oral
contrast material. Normal-appearing small bowel. Focal wall
thickening involving the mid transverse colon. Remainder of the
colon normal in appearance with expected stool burden.
Normal-appearing retrocecal appendix in the RIGHT UPPER pelvis.

Vascular/Lymphatic: Mild aortoiliac atherosclerosis without evidence
of aneurysm. Normal-appearing portal venous and systemic venous
systems. Circumaortic LEFT renal vein.

No pathologic lymphadenopathy.

Reproductive: Surgically absent uterus. Normal-appearing ovaries. No
adnexal masses.

Other: None.

Musculoskeletal: Prior ANTERIOR L4-5 fusion. Severe disc space
narrowing at L3-4. Moderate disc space narrowing at L1-2 and L2-3.
Facet degenerative changes throughout the lumbar spine. No acute
findings.
IMPRESSION: 1. Focal wall thickening involving the mid transverse colon, likely
indicating focal colitis.
2. No acute abnormalities otherwise involving the abdomen or pelvis.
3. Possible polyps involving the gallbladder wall (versus adherent
non-dependent small noncalcified gallstones).
4.  Aortic Atherosclerosis, mild.  ([id number]-170.0)

## 2019-09-30 ENCOUNTER — Telehealth: Payer: Self-pay | Admitting: Internal Medicine

## 2019-09-30 MED ORDER — AMLODIPINE BESYLATE 10 MG PO TABS: 10.0000 mg | ORAL_TABLET | Freq: Every day | ORAL | 1 refills | Status: DC

## 2019-09-30 NOTE — Telephone Encounter (Signed)
Rx sent through e-scribe  

## 2019-09-30 NOTE — Telephone Encounter (Signed)
Medication Refill - Medication:  amLODipine (NORVASC) 10 MG tablet   Has the patient contacted their pharmacy? Patient called in stating she is needing this medication refilled and was told by PCP to call in to have filled as Dr.Aron was previously on it.   Preferred Pharmacy (with phone number or street name):  [facility] ([address]), [place] - [address] Phone:  [phone]  Fax:  [phone]

## 2019-10-01 ENCOUNTER — Encounter: Payer: Self-pay | Admitting: Family Medicine

## 2019-10-01 ENCOUNTER — Ambulatory Visit
Admission: RE | Admit: 2019-10-01 | Discharge: 2019-10-01 | Disposition: A | Discharge status: Home / Self Care | Payer: No Typology Code available for payment source | Source: Ambulatory Visit | Attending: Family Medicine | Admitting: Family Medicine

## 2019-10-01 ENCOUNTER — Other Ambulatory Visit: Payer: Self-pay

## 2019-10-01 ENCOUNTER — Ambulatory Visit (INDEPENDENT_AMBULATORY_CARE_PROVIDER_SITE_OTHER): Payer: No Typology Code available for payment source | Admitting: Family Medicine

## 2019-10-01 VITALS — BP 150/84 | HR 123 | Temp 98.1°F | Ht 66.0 in | Wt 137.0 lb

## 2019-10-01 DIAGNOSIS — R1030 Lower abdominal pain, unspecified: Secondary | ICD-10-CM

## 2019-10-01 DIAGNOSIS — R1032 Left lower quadrant pain: Secondary | ICD-10-CM

## 2019-10-01 DIAGNOSIS — R6889 Other general symptoms and signs: Secondary | ICD-10-CM | POA: Diagnosis not present

## 2019-10-01 DIAGNOSIS — R1013 Epigastric pain: Secondary | ICD-10-CM

## 2019-10-01 DIAGNOSIS — R000 Tachycardia, unspecified: Secondary | ICD-10-CM

## 2019-10-01 LAB — CBC WITH DIFFERENTIAL/PLATELET
Basophils Absolute: 0 10*3/uL (ref 0.0–0.1)
Basophils Relative: 0.3 % (ref 0.0–3.0)
Eosinophils Absolute: 0 10*3/uL (ref 0.0–0.7)
Eosinophils Relative: 0.2 % (ref 0.0–5.0)
HCT: 48.2 % — ABNORMAL HIGH (ref 36.0–46.0)
Hemoglobin: 16.5 g/dL — ABNORMAL HIGH (ref 12.0–15.0)
Lymphocytes Relative: 18 % (ref 12.0–46.0)
Lymphs Abs: 2.6 10*3/uL (ref 0.7–4.0)
MCHC: 34.2 g/dL (ref 30.0–36.0)
MCV: 92.4 fl (ref 78.0–100.0)
Monocytes Absolute: 1 10*3/uL (ref 0.1–1.0)
Monocytes Relative: 7.3 % (ref 3.0–12.0)
Neutro Abs: 10.7 10*3/uL — ABNORMAL HIGH (ref 1.4–7.7)
Neutrophils Relative %: 74.2 % (ref 43.0–77.0)
Platelets: 385 10*3/uL (ref 150.0–400.0)
RBC: 5.22 Mil/uL — ABNORMAL HIGH (ref 3.87–5.11)
RDW: 13.8 % (ref 11.5–15.5)
WBC: 14.4 10*3/uL — ABNORMAL HIGH (ref 4.0–10.5)

## 2019-10-01 LAB — COMPREHENSIVE METABOLIC PANEL
ALT: 16 U/L (ref 0–35)
AST: 18 U/L (ref 0–37)
Albumin: 5 g/dL (ref 3.5–5.2)
Alkaline Phosphatase: 74 U/L (ref 39–117)
BUN: 12 mg/dL (ref 6–23)
CO2: 24 mEq/L (ref 19–32)
Calcium: 10.5 mg/dL (ref 8.4–10.5)
Chloride: 93 mEq/L — ABNORMAL LOW (ref 96–112)
Creatinine, Ser: 0.76 mg/dL (ref 0.40–1.20)
GFR: 76.48 mL/min (ref >60.00)
Glucose, Bld: 120 mg/dL — ABNORMAL HIGH (ref 70–99)
Potassium: 3 mEq/L — ABNORMAL LOW (ref 3.5–5.1)
Sodium: 130 mEq/L — ABNORMAL LOW (ref 135–145)
Total Bilirubin: 0.7 mg/dL (ref 0.2–1.2)
Total Protein: 7.9 g/dL (ref 6.0–8.3)

## 2019-10-01 LAB — POC URINALSYSI DIPSTICK (AUTOMATED)
Bilirubin, UA: NEGATIVE
Blood, UA: NEGATIVE
Glucose, UA: NEGATIVE
Leukocytes, UA: NEGATIVE
Nitrite, UA: NEGATIVE
Protein, UA: POSITIVE — AB
Spec Grav, UA: 1.015 (ref 1.010–1.025)
Urobilinogen, UA: 0.2 E.U./dL
pH, UA: 7 (ref 5.0–8.0)

## 2019-10-01 LAB — LIPASE: Lipase: 18 U/L (ref 11.0–59.0)

## 2019-10-01 MED ORDER — AMOXICILLIN-POT CLAVULANATE 875-125 MG PO TABS: 1.0000 | ORAL_TABLET | Freq: Two times a day (BID) | ORAL | 0 refills | Status: DC

## 2019-10-01 MED ORDER — ONDANSETRON HCL 8 MG PO TABS
8.0000 mg | ORAL_TABLET | Freq: Three times a day (TID) | ORAL | 0 refills | Status: DC | PRN
Start: 2019-10-01 — End: 2019-10-27

## 2019-10-01 MED ORDER — IOPAMIDOL (ISOVUE-300) INJECTION 61%
100.0000 mL | Freq: Once | INTRAVENOUS | Status: AC | PRN
  Administered 2019-10-01: 100 mL via INTRAVENOUS

## 2019-10-01 NOTE — Patient Instructions (Addendum)
Push fluids! Use zofran for nausea. Start Augmentin ..  go to ER  if not able to keep down.  Please stop at the lab to have labs drawn. We will get you the apportionment for CT scan before you leave today... wait in lab or waiting area.  go to ER if severe pain, cannot keep down fluid or fever on antibiotics.

## 2019-10-01 NOTE — Progress Notes (Signed)
Chief Complaint  Patient presents with  . Abdominal Pain    Low  . Abdominal Pain    Epicgastric Pressure  . Emesis  . Excessive Sweating  . Tachycardia  . Shaking    History of Present Illness: HPI   64 year old female pt of Rollene F[facility] Baity's with history of HTN presents with new onset  Sweating  5 days ago. This was followed with low abd pain.. LLQ. Yesterday started with nausea. Had emesis yesterday.  She has gatorade and water  Regularly.  No diarhea, no constipation.. no blood in stool.  Epigastric abdominal pressure. She feels more shaky.  Decreased appetite.  No dysuria, no frequency , no urgency.  She has some low back pain.. history of DDD. Low back pain a little greater in last few days, NO CVA tenderness  History of frequent UTI  Heart rate is increased. HR 123  SImilar issue last year.. note  And studies reviewed. 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 Did tolerate augmentin.  IMPRESSION: 1. Focal wall thickening involving the mid transverse colon, likely indicating focal colitis. 2. No acute abnormalities otherwise involving the abdomen or pelvis. 3. Possible polyps involving the gallbladder wall (versus adherent non-dependent small noncalcified gallstones). 4.  Aortic Atherosclerosis, mild.    This visit occurred during the SARS-CoV-2 public health emergency.  Safety protocols were in place, including screening questions prior to the visit, additional usage of staff PPE, and extensive cleaning of exam room while observing appropriate contact time as indicated for disinfecting solutions.   COVID 19 screen:  No recent travel or known exposure to COVID19 The patient denies respiratory symptoms of COVID 19 at this time. The importance of social distancing was discussed today.     Review of Systems  Constitutional: Negative for chills and fever.  HENT: Negative for congestion and ear pain.   Eyes: Negative for pain  and redness.  Respiratory: Negative for cough and shortness of breath.   Cardiovascular: Negative for chest pain, palpitations and leg swelling.  Gastrointestinal: Positive for abdominal pain, nausea and vomiting. Negative for blood in stool, constipation and diarrhea.  Genitourinary: Negative for dysuria.  Musculoskeletal: Negative for falls and myalgias.  Skin: Negative for rash.  Neurological: Positive for tremors. Negative for dizziness.  Psychiatric/Behavioral: Negative for depression. The patient is not nervous/anxious.       Past Medical History:  Diagnosis Date  . Anxiety   . DJD (degenerative joint disease)    Of the back  . Hypertension     reports that she quit smoking about 3 years ago. Her smoking use included cigarettes. She has never used smokeless tobacco. She reports current alcohol use of about 6.0 standard drinks of alcohol per week. She reports that she does not use drugs.   Current Outpatient Medications:  .  ALPRAZolam (XANAX) 0.25 MG tablet, Take 1 tablet by mouth twice daily as needed for anxiety, Disp: 30 tablet, Rfl: 5 .  amLODipine (NORVASC) 10 MG tablet, Take 1 tablet (10 mg total) by mouth daily., Disp: 90 tablet, Rfl: 1 .  estradiol (ESTRACE) 1 MG tablet, Take 1 tablet by mouth twice daily, Disp: 180 tablet, Rfl: 1 .  gabapentin (NEURONTIN) 300 MG capsule, Take 1 capsule by mouth at bedtime as needed., Disp: , Rfl:  .  hydrochlorothiazide (HYDRODIURIL) 25 MG tablet, Take 1 tablet (25 mg total) by mouth daily., Disp: 90 tablet, Rfl: 2 .  methimazole (TAPAZOLE) 5  MG tablet, Take 1 tablet (5 mg total) by mouth 3 (three) times a week., Disp: 36 tablet, Rfl: 2 .  zolpidem (AMBIEN) 5 MG tablet, TAKE 1 TABLET BY MOUTH AT BEDTIME AS NEEDED FOR SLEEP, Disp: 90 tablet, Rfl: 1   Observations/Objective: Blood pressure (!) 150/84, pulse (!) 123, temperature 98.1 F (36.7 C), temperature source Temporal, height 5\' 6"  (1.676 m), weight 137 lb (62.1 kg), SpO2 97  %.  Physical Exam Constitutional:      General: She is not in acute distress.    Appearance: Normal appearance. She is well-developed. She is ill-appearing. She is not toxic-appearing.     Comments: Shaking.. ? Anxiety of rigors  HENT:     Head: Normocephalic.     Right Ear: Hearing, tympanic membrane, ear canal and external ear normal. Tympanic membrane is not erythematous, retracted or bulging.     Left Ear: Hearing, tympanic membrane, ear canal and external ear normal. Tympanic membrane is not erythematous, retracted or bulging.     Nose: No mucosal edema or rhinorrhea.     Right Sinus: No maxillary sinus tenderness or frontal sinus tenderness.     Left Sinus: No maxillary sinus tenderness or frontal sinus tenderness.     Mouth/Throat:     Pharynx: Uvula midline.  Eyes:     General: Lids are normal. Lids are everted, no foreign bodies appreciated.     Conjunctiva/sclera: Conjunctivae normal.     Pupils: Pupils are equal, round, and reactive to light.  Neck:     Thyroid: No thyroid mass or thyromegaly.     Vascular: No carotid bruit.     Trachea: Trachea normal.  Cardiovascular:     Rate and Rhythm: Regular rhythm. Tachycardia present.     Pulses: Normal pulses.     Heart sounds: Normal heart sounds, S1 normal and S2 normal. No murmur heard.  No friction rub. No gallop.   Pulmonary:     Effort: Pulmonary effort is normal. No tachypnea or respiratory distress.     Breath sounds: Normal breath sounds. No decreased breath sounds, wheezing, rhonchi or rales.  Abdominal:     General: Bowel sounds are normal.     Palpations: Abdomen is soft.     Tenderness: There is abdominal tenderness in the epigastric area, suprapubic area and left lower quadrant. There is no right CVA tenderness or left CVA tenderness.  Musculoskeletal:     Cervical back: Normal range of motion and neck supple.  Skin:    General: Skin is warm and dry.     Findings: No rash.  Neurological:     Mental Status:  She is alert.  Psychiatric:        Mood and Affect: Mood is not anxious or depressed.        Speech: Speech normal.        Behavior: Behavior normal. Behavior is cooperative.        Thought Content: Thought content normal.        Judgment: Judgment normal.      Assessment and Plan    Left lower quadrant abdominal pain No clear sign or symptoms of UTI.  More likely diverticulitis or other colitits as she has had in past.  eval with CT, labs.  Check blood cultures given? Rigors and tachycardia.  Does not tolerate flagyl/cipro per report.  Start Augmentin BID x 10 days.  Reviewed ER precautions and red flags.  Close follow up  Early next week with me as PCP  out of town.  Tachycardia ? Due to anxiety.  pOssible dehydration given ketone in urine.. but she is pushing fluids.  Eval with blood culture to eval for sepsis.    Eliezer Lofts, MD

## 2019-10-01 NOTE — Assessment & Plan Note (Signed)
?   Due to anxiety.  pOssible dehydration given ketone in urine.. but she is pushing fluids.  Eval with blood culture to eval for sepsis.

## 2019-10-01 NOTE — Assessment & Plan Note (Signed)
No clear sign or symptoms of UTI.  More likely diverticulitis or other colitits as she has had in past.  eval with CT, labs.  Check blood cultures given? Rigors and tachycardia.  Does not tolerate flagyl/cipro per report.  Start Augmentin BID x 10 days.  Reviewed ER precautions and red flags.  Close follow up  Early next week with me as PCP out of town.

## 2019-10-06 ENCOUNTER — Ambulatory Visit (INDEPENDENT_AMBULATORY_CARE_PROVIDER_SITE_OTHER): Payer: No Typology Code available for payment source | Admitting: Family Medicine

## 2019-10-06 ENCOUNTER — Encounter: Payer: Self-pay | Admitting: Family Medicine

## 2019-10-06 ENCOUNTER — Other Ambulatory Visit: Payer: Self-pay

## 2019-10-06 VITALS — BP 142/80 | HR 98 | Temp 98.0°F | Ht 66.0 in | Wt 137.5 lb

## 2019-10-06 DIAGNOSIS — D72829 Elevated white blood cell count, unspecified: Secondary | ICD-10-CM | POA: Insufficient documentation

## 2019-10-06 DIAGNOSIS — R1030 Lower abdominal pain, unspecified: Secondary | ICD-10-CM | POA: Diagnosis not present

## 2019-10-06 DIAGNOSIS — E876 Hypokalemia: Secondary | ICD-10-CM | POA: Diagnosis not present

## 2019-10-06 DIAGNOSIS — R000 Tachycardia, unspecified: Secondary | ICD-10-CM

## 2019-10-06 DIAGNOSIS — F411 Generalized anxiety disorder: Secondary | ICD-10-CM | POA: Insufficient documentation

## 2019-10-06 LAB — CBC WITH DIFFERENTIAL/PLATELET
Basophils Absolute: 0 10*3/uL (ref 0.0–0.1)
Basophils Relative: 0.2 % (ref 0.0–3.0)
Eosinophils Absolute: 0.2 10*3/uL (ref 0.0–0.7)
Eosinophils Relative: 1.4 % (ref 0.0–5.0)
HCT: 47.3 % — ABNORMAL HIGH (ref 36.0–46.0)
Hemoglobin: 15.6 g/dL — ABNORMAL HIGH (ref 12.0–15.0)
Lymphocytes Relative: 22 % (ref 12.0–46.0)
Lymphs Abs: 2.6 10*3/uL (ref 0.7–4.0)
MCHC: 33 g/dL (ref 30.0–36.0)
MCV: 94.2 fl (ref 78.0–100.0)
Monocytes Absolute: 0.9 10*3/uL (ref 0.1–1.0)
Monocytes Relative: 7.6 % (ref 3.0–12.0)
Neutro Abs: 8.2 10*3/uL — ABNORMAL HIGH (ref 1.4–7.7)
Neutrophils Relative %: 68.8 % (ref 43.0–77.0)
Platelets: 372 10*3/uL (ref 150.0–400.0)
RBC: 5.02 Mil/uL (ref 3.87–5.11)
RDW: 13.2 % (ref 11.5–15.5)
WBC: 12 10*3/uL — ABNORMAL HIGH (ref 4.0–10.5)

## 2019-10-06 LAB — BASIC METABOLIC PANEL
BUN: 11 mg/dL (ref 6–23)
CO2: 31 mEq/L (ref 19–32)
Calcium: 10.9 mg/dL — ABNORMAL HIGH (ref 8.4–10.5)
Chloride: 94 mEq/L — ABNORMAL LOW (ref 96–112)
Creatinine, Ser: 0.65 mg/dL (ref 0.40–1.20)
GFR: 91.6 mL/min (ref >60.00)
Glucose, Bld: 100 mg/dL — ABNORMAL HIGH (ref 70–99)
Potassium: 3.8 mEq/L (ref 3.5–5.1)
Sodium: 133 mEq/L — ABNORMAL LOW (ref 135–145)

## 2019-10-06 MED ORDER — CITALOPRAM HYDROBROMIDE 10 MG PO TABS: 10.0000 mg | ORAL_TABLET | Freq: Every day | ORAL | 3 refills | Status: DC

## 2019-10-06 NOTE — Progress Notes (Signed)
Chief Complaint  Patient presents with  . Follow-up    LLQ pain    History of Present Illness: HPI  64 year old female patient of regina Baity's presents for  Follow up LLQ abdominal pain.  10/01/2019 CT scan Showed no acute changes   Blood cultures clear to date x 2 Na and K low as decreased fluids.. changed to guatorade.  lipase negative. Wbc 14.4  Treated with Augmentin x 10 days on day 6/10 for possible colitis ( CT returned neg)    Today she reports: She has improved LLQ pain.   No pain in epigastrum or LLQ. She has started eating some.. minimal BMs.  She has been increasing fluids and drinking gatorade. Tachycardia has improved with hydration, but she is still having spells when feels shaky and fast heart rate.  No caffeine, no decongestant. no CP, no SOB.  She is having trouble controlling her anxiety, gradually worse in last year.. she is using alprazolam 0.25 mg BID as needed... this is not helping She has felt she has needed to take this daily in last few weeks.  Was onsertrlaine in past.. no SE but had issue with SE coming off. She is having trouble sleeping in last year     This visit occurred during the SARS-CoV-2 public health emergency.  Safety protocols were in place, including screening questions prior to the visit, additional usage of staff PPE, and extensive cleaning of exam room while observing appropriate contact time as indicated for disinfecting solutions.   COVID 19 screen:  No recent travel or known exposure to COVID19 The patient denies respiratory symptoms of COVID 19 at this time. The importance of social distancing was discussed today.     Review of Systems  Constitutional: Negative for chills and fever.  HENT: Negative for congestion and ear pain.   Eyes: Negative for pain and redness.  Respiratory: Negative for cough and shortness of breath.   Cardiovascular: Negative for chest pain, palpitations and leg swelling.  Gastrointestinal:  Negative for abdominal pain, blood in stool, constipation, diarrhea, nausea and vomiting.  Genitourinary: Negative for dysuria.  Musculoskeletal: Negative for falls and myalgias.  Skin: Negative for rash.  Neurological: Negative for dizziness.  Psychiatric/Behavioral: Negative for depression. The patient is not nervous/anxious.       Past Medical History:  Diagnosis Date  . Anxiety   . DJD (degenerative joint disease)    Of the back  . Hypertension     reports that she quit smoking about 3 years ago. Her smoking use included cigarettes. She has never used smokeless tobacco. She reports current alcohol use of about 6.0 standard drinks of alcohol per week. She reports that she does not use drugs.   Current Outpatient Medications:  .  ALPRAZolam (XANAX) 0.25 MG tablet, Take 1 tablet by mouth twice daily as needed for anxiety, Disp: 30 tablet, Rfl: 5 .  amLODipine (NORVASC) 10 MG tablet, Take 1 tablet (10 mg total) by mouth daily., Disp: 90 tablet, Rfl: 1 .  amoxicillin-clavulanate (AUGMENTIN) 875-125 MG tablet, Take 1 tablet by mouth 2 (two) times daily., Disp: 20 tablet, Rfl: 0 .  estradiol (ESTRACE) 1 MG tablet, Take 1 tablet by mouth twice daily, Disp: 180 tablet, Rfl: 1 .  gabapentin (NEURONTIN) 300 MG capsule, Take 1 capsule by mouth at bedtime as needed., Disp: , Rfl:  .  hydrochlorothiazide (HYDRODIURIL) 25 MG tablet, Take 1 tablet (25 mg total) by mouth daily., Disp: 90 tablet, Rfl: 2 .  methimazole (  TAPAZOLE) 5 MG tablet, Take 1 tablet (5 mg total) by mouth 3 (three) times a week., Disp: 36 tablet, Rfl: 2 .  ondansetron (ZOFRAN) 8 MG tablet, Take 1 tablet (8 mg total) by mouth every 8 (eight) hours as needed for nausea or vomiting., Disp: 20 tablet, Rfl: 0 .  zolpidem (AMBIEN) 5 MG tablet, TAKE 1 TABLET BY MOUTH AT BEDTIME AS NEEDED FOR SLEEP, Disp: 90 tablet, Rfl: 1   Observations/Objective: Blood pressure (!) 142/80, pulse 98, temperature 98 F (36.7 C), temperature source  Temporal, height 5\' 6"  (1.676 m), weight 137 lb 8 oz (62.4 kg), SpO2 98 %.  Physical Exam Constitutional:      General: She is not in acute distress.    Appearance: Normal appearance. She is well-developed. She is not ill-appearing or toxic-appearing.  HENT:     Head: Normocephalic.     Right Ear: Hearing, tympanic membrane, ear canal and external ear normal. Tympanic membrane is not erythematous, retracted or bulging.     Left Ear: Hearing, tympanic membrane, ear canal and external ear normal. Tympanic membrane is not erythematous, retracted or bulging.     Nose: No mucosal edema or rhinorrhea.     Right Sinus: No maxillary sinus tenderness or frontal sinus tenderness.     Left Sinus: No maxillary sinus tenderness or frontal sinus tenderness.     Mouth/Throat:     Pharynx: Uvula midline.  Eyes:     General: Lids are normal. Lids are everted, no foreign bodies appreciated.     Conjunctiva/sclera: Conjunctivae normal.     Pupils: Pupils are equal, round, and reactive to light.  Neck:     Thyroid: No thyroid mass or thyromegaly.     Vascular: No carotid bruit.     Trachea: Trachea normal.  Cardiovascular:     Rate and Rhythm: Normal rate and regular rhythm.     Pulses: Normal pulses.     Heart sounds: Normal heart sounds, S1 normal and S2 normal. No murmur heard.  No friction rub. No gallop.   Pulmonary:     Effort: Pulmonary effort is normal. No tachypnea or respiratory distress.     Breath sounds: Normal breath sounds. No decreased breath sounds, wheezing, rhonchi or rales.  Abdominal:     General: Bowel sounds are normal.     Palpations: Abdomen is soft.     Tenderness: There is no abdominal tenderness.  Musculoskeletal:     Cervical back: Normal range of motion and neck supple.  Skin:    General: Skin is warm and dry.     Findings: No rash.  Neurological:     Mental Status: She is alert.     Motor: Tremor present.  Psychiatric:        Mood and Affect: Mood is anxious.  Mood is not depressed.        Speech: Speech normal.        Behavior: Behavior normal. Behavior is cooperative.        Thought Content: Thought content normal.        Judgment: Judgment normal.      Assessment and Plan Lower abdominal pain ? Functional pain versus GI infection.  Neg CT but improvement in pain with Augmentin course.  Complete antibiotics.  re-eval wbc for downward trend.   Hypokalemia Due for re-eval after hydration.  Tachycardia Avoid caffeine, decongestants. Improved with hydration but may in part be due to anxiety.   GAD (generalized anxiety disorder) Poor control. Start low  dose trial of citalopram. ( SE to weaning off sertraline in past). Avoid increase in alprazolam use.  Follow up with PCP in 3-4 weeks for re-assessment.       Eliezer Lofts, MD

## 2019-10-06 NOTE — Assessment & Plan Note (Signed)
Avoid caffeine, decongestants. Improved with hydration but may in part be due to anxiety.

## 2019-10-06 NOTE — Patient Instructions (Addendum)
Please stop at the lab to have labs drawn.  Start low dose citalopram once antibiotics are completed.  Avoid caffeine and decongestants.

## 2019-10-06 NOTE — Assessment & Plan Note (Signed)
Poor control. Start low dose trial of citalopram. ( SE to weaning off sertraline in past). Avoid increase in alprazolam use.  Follow up with PCP in 3-4 weeks for re-assessment.

## 2019-10-06 NOTE — Assessment & Plan Note (Signed)
Due for re-eval after hydration.

## 2019-10-06 NOTE — Assessment & Plan Note (Signed)
?   Functional pain versus GI infection.  Neg CT but improvement in pain with Augmentin course.  Complete antibiotics.  re-eval wbc for downward trend.

## 2019-10-07 LAB — CULTURE, BLOOD (SINGLE)
MICRO NUMBER:: 10765262
MICRO NUMBER:: 10765264
SPECIMEN QUALITY:: ADEQUATE
SPECIMEN QUALITY:: ADEQUATE

## 2019-10-15 ENCOUNTER — Telehealth: Payer: Self-pay | Admitting: Internal Medicine

## 2019-10-15 MED ORDER — ESTRADIOL 1 MG PO TABS: 1.0000 mg | ORAL_TABLET | Freq: Two times a day (BID) | ORAL | 1 refills | Status: DC

## 2019-10-15 NOTE — Telephone Encounter (Signed)
Patient called stating she needs her 1 MG Estradiol 90 day supply prescription refilled. Please call patient when prescription is refilled.

## 2019-10-15 NOTE — Telephone Encounter (Signed)
Rx sent through e-scribe  

## 2019-10-27 ENCOUNTER — Other Ambulatory Visit: Payer: Self-pay

## 2019-10-27 ENCOUNTER — Ambulatory Visit (INDEPENDENT_AMBULATORY_CARE_PROVIDER_SITE_OTHER): Payer: No Typology Code available for payment source | Admitting: Internal Medicine

## 2019-10-27 ENCOUNTER — Encounter: Payer: Self-pay | Admitting: Internal Medicine

## 2019-10-27 VITALS — BP 138/78 | HR 76 | Temp 97.3°F | Wt 134.0 lb

## 2019-10-27 DIAGNOSIS — Z833 Family history of diabetes mellitus: Secondary | ICD-10-CM

## 2019-10-27 DIAGNOSIS — W57XXXA Bitten or stung by nonvenomous insect and other nonvenomous arthropods, initial encounter: Secondary | ICD-10-CM | POA: Diagnosis not present

## 2019-10-27 DIAGNOSIS — R202 Paresthesia of skin: Secondary | ICD-10-CM

## 2019-10-27 MED ORDER — ALPRAZOLAM 0.25 MG PO TABS: 0.2500 mg | ORAL_TABLET | Freq: Two times a day (BID) | ORAL | 0 refills | Status: DC | PRN

## 2019-10-27 NOTE — Progress Notes (Signed)
Subjective:    Patient ID: Anna Moore, female    DOB: 12-26-1955, 64 y.o.   MRN: [id number]  HPI  Patient presents the clinic today for 1 month follow-up.  She was seen by Dr. Diona Browner for difficulty controlling anxiety.  She was started on Citalopram in addition to her Xanax.  She has been taking the medication as prescribed but had to stop it because she started having more trouble sleeping and felt like she was crawling out of her skin. She reports her fatigue, chest heaviness and heart racing has improved. She is still taking Xanax about 1 time per week, she would like a refill of this today.  She denies depression.  She is currently not seeing a therapist.  She denies SI/HI. She did make an appt with a cardiologist just to make sure the issues she was having are not related to her heart.  She is also concerned about intermittent right leg pain. She describes the pain as achy. She reports associated intermittent numbness but denies tingling or weakness. She denies any injury to the area but has a history of chronic back pain,s/p fusion- but does not seem to be related. Seh reports history of multiple tick bites in 2019 and is concerned that some of her issues could be related to Lyme disease- she would like to be checked for this today. She has not taken anything OTC for this.  She also reports intermittent abnormal taste in her mouth. She was recently on abx but denies sore throat, white coating of the tongue. She reports the last time she noticed this, was when she had gestational diabetes. She also reports family history of DM. She would like to be screened for DM 2 today.  Review of Systems      Past Medical History:  Diagnosis Date  . Anxiety   . DJD (degenerative joint disease)    Of the back  . Hypertension     Current Outpatient Medications  Medication Sig Dispense Refill  . ALPRAZolam (XANAX) 0.25 MG tablet Take 1 tablet by mouth twice daily as needed for anxiety 30  tablet 5  . amLODipine (NORVASC) 10 MG tablet Take 1 tablet (10 mg total) by mouth daily. 90 tablet 1  . amoxicillin-clavulanate (AUGMENTIN) 875-125 MG tablet Take 1 tablet by mouth 2 (two) times daily. 20 tablet 0  . citalopram (CELEXA) 10 MG tablet Take 1 tablet (10 mg total) by mouth daily. 30 tablet 3  . estradiol (ESTRACE) 1 MG tablet Take 1 tablet (1 mg total) by mouth 2 (two) times daily. 180 tablet 1  . gabapentin (NEURONTIN) 300 MG capsule Take 1 capsule by mouth at bedtime as needed.    . hydrochlorothiazide (HYDRODIURIL) 25 MG tablet Take 1 tablet (25 mg total) by mouth daily. 90 tablet 2  . methimazole (TAPAZOLE) 5 MG tablet Take 1 tablet (5 mg total) by mouth 3 (three) times a week. 36 tablet 2  . ondansetron (ZOFRAN) 8 MG tablet Take 1 tablet (8 mg total) by mouth every 8 (eight) hours as needed for nausea or vomiting. 20 tablet 0  . zolpidem (AMBIEN) 5 MG tablet TAKE 1 TABLET BY MOUTH AT BEDTIME AS NEEDED FOR SLEEP 90 tablet 1   No current facility-administered medications for this visit.    No Known Allergies  Family History  Problem Relation Age of Onset  . Atrial fibrillation Mother   . Hypertension Father   . Heart disease Father   . Heart attack Father  57's  . Cancer Father        non hodgkins lymphoma  . Diabetes Father   . Colon cancer Neg Hx   . Thyroid disease Neg Hx     Social History   Socioeconomic History  . Marital status: Married    Spouse name: Not on file  . Number of children: Not on file  . Years of education: Not on file  . Highest education level: Not on file  Occupational History  . Not on file  Tobacco Use  . Smoking status: Former Smoker    Types: Cigarettes    Quit date: 04/08/2016    Years since quitting: 3.5  . Smokeless tobacco: Never Used  Vaping Use  . Vaping Use: Never used  Substance and Sexual Activity  . Alcohol use: Yes    Alcohol/week: 6.0 standard drinks    Types: 6 Cans of beer per week    Comment: 5 beers a  week  . Drug use: No  . Sexual activity: Not on file  Other Topics Concern  . Not on file  Social History Narrative   Married.  One daughter, 15 yo- moving back in with her.   Retired Medical illustrator.   Social Determinants of Health   Financial Resource Strain:   . Difficulty of Paying Living Expenses: Not on file  Food Insecurity:   . Worried About Charity fundraiser in the Last Year: Not on file  . Ran Out of Food in the Last Year: Not on file  Transportation Needs:   . Lack of Transportation (Medical): Not on file  . Lack of Transportation (Non-Medical): Not on file  Physical Activity:   . Days of Exercise per Week: Not on file  . Minutes of Exercise per Session: Not on file  Stress:   . Feeling of Stress : Not on file  Social Connections:   . Frequency of Communication with Friends and Family: Not on file  . Frequency of Social Gatherings with Friends and Family: Not on file  . Attends Religious Services: Not on file  . Active Member of Clubs or Organizations: Not on file  . Attends Archivist Meetings: Not on file  . Marital Status: Not on file  Intimate Partner Violence:   . Fear of Current or Ex-Partner: Not on file  . Emotionally Abused: Not on file  . Physically Abused: Not on file  . Sexually Abused: Not on file     Constitutional: Denies fever, malaise, fatigue, headache or abrupt weight changes.  HEENT: Pt reports abnormal taste. Denies eye pain, eye redness, ear pain, ringing in the ears, wax buildup, runny nose, nasal congestion, bloody nose, or sore throat. Respiratory: Denies difficulty breathing, shortness of breath, cough or sputum production.   Cardiovascular: Denies chest pain, chest tightness, palpitations or swelling in the hands or feet.  Gastrointestinal: Denies abdominal pain, bloating, constipation, diarrhea or blood in the stool.  GU: Denies urgency, frequency, pain with urination, burning sensation, blood in urine, odor or  discharge. Musculoskeletal: Pt reports chronic back pain, right leg pain. Denies decrease in range of motion, difficulty with gait, muscle pain or joint swelling.  Skin: Denies redness, rashes, lesions or ulcercations.  Neurological: Pt reports intermittent Denies dizziness, difficulty with memory, difficulty with speech or problems with balance and coordination.  Psych: Pt reports anxiety. Denies depression, SI/HI.  No other specific complaints in a complete review of systems (except as listed in HPI above).  Objective:  Physical Exam  BP 138/78   Pulse 76   Temp (!) 97.3 F (36.3 C) (Temporal)   Wt 134 lb (60.8 kg)   SpO2 98%   BMI 21.63 kg/m   Wt Readings from Last 3 Encounters:  10/06/19 137 lb 8 oz (62.4 kg)  10/01/19 137 lb (62.1 kg)  06/15/19 138 lb (62.6 kg)    General: Appears her stated age, well developed, well nourished in NAD. Skin: Warm, dry and intact. No rashes noted. HEENT: Head: normal shape and size; Eyes: sclera white, no icterus, conjunctiva pink, PERRLA and EOMs intact;  Cardiovascular: Normal rate and rhythm. S1,S2 noted.  No murmur, rubs or gallops noted. No JVD or BLE edema. Pulmonary/Chest: Normal effort and positive vesicular breath sounds. No respiratory distress. No wheezes, rales or ronchi noted.  Musculoskeletal: Normal flexion, extension, rotation and lateral bending of the spine. No bony tenderness noted over the spine. Strength 5/5 BLE.  No difficulty with gait.  Neurological: Alert and oriented. Sensation intact to BLE. Psychiatric: Mildly anxious appearing. Behavior is normal. Judgment and thought content normal.     BMET    Component Value Date/Time   NA 133 (L) 10/06/2019 1252   K 3.8 10/06/2019 1252   CL 94 (L) 10/06/2019 1252   CO2 31 10/06/2019 1252   GLUCOSE 100 (H) 10/06/2019 1252   BUN 11 10/06/2019 1252   CREATININE 0.65 10/06/2019 1252   CREATININE 0.66 08/27/2018 1108   CALCIUM 10.9 (H) 10/06/2019 1252   GFRNONAA >60  10/08/2016 0949   GFRAA >60 10/08/2016 0949    Lipid Panel     Component Value Date/Time   CHOL 254 (H) 08/27/2018 1108   TRIG 128 08/27/2018 1108   HDL 85 08/27/2018 1108   CHOLHDL 3.0 08/27/2018 1108   VLDL 14.0 08/07/2017 1056   LDLCALC 144 (H) 08/27/2018 1108    CBC    Component Value Date/Time   WBC 12.0 (H) 10/06/2019 1252   RBC 5.02 10/06/2019 1252   HGB 15.6 (H) 10/06/2019 1252   HCT 47.3 (H) 10/06/2019 1252   PLT 372.0 10/06/2019 1252   MCV 94.2 10/06/2019 1252   MCH 31.6 08/27/2018 1108   MCHC 33.0 10/06/2019 1252   RDW 13.2 10/06/2019 1252   LYMPHSABS 2.6 10/06/2019 1252   MONOABS 0.9 10/06/2019 1252   EOSABS 0.2 10/06/2019 1252   BASOSABS 0.0 10/06/2019 1252    Hgb A1C Lab Results  Component Value Date   HGBA1C 5.4 08/27/2018            Assessment & Plan:   Anxiety:  Did not do well with Citalopram, will d/c Continue Xanax prn  Right Leg Pain, Paresthesia of Right Leg:  I think this is likely coming from her back Will check Vit D and B12 to make sure there is no deficiency Advised her to follow up with [facility]  Family History of DM 2:  A1C today  Multiple Tick Bites:  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  Safety protocols were in place, including screening questions prior to the visit, additional usage of staff PPE, and extensive cleaning of exam room while observing appropriate contact time as indicated for disinfecting solutions.

## 2019-10-27 NOTE — Patient Instructions (Signed)
Paresthesia Paresthesia is a burning or prickling feeling. This feeling can happen in any part of the body. It often happens in the hands, arms, legs, or feet. Usually, it is not painful. In most cases, the feeling goes away in a short time and is not a sign of a serious problem. If you have paresthesia that lasts a long time, you may need to be seen by your doctor. Follow these instructions at home: Alcohol use   Do not drink alcohol if: ? Your doctor tells you not to drink. ? You are pregnant, may be pregnant, or are planning to become pregnant.  If you drink alcohol: ? Limit how much you use to:  0-1 drink a day for women.  0-2 drinks a day for men. ? Be aware of how much alcohol is in your drink. In the [place] one drink equals one 12 oz bottle of beer (355 mL), one 5 oz glass of wine (148 mL), or one 1 oz glass of hard liquor (44 mL). Nutrition   Eat a healthy diet. This includes: ? Eating foods that have a lot of fiber in them, such as fresh fruits and vegetables, whole grains, and beans. ? Limiting foods that have a lot of fat and processed sugars in them, such as fried or sweet foods. General instructions  Take over-the-counter and prescription medicines only as told by your doctor.  Do not use any products that have nicotine or tobacco in them, such as cigarettes and e-cigarettes. If you need help quitting, ask your doctor.  If you have diabetes, work with your doctor to make sure your blood sugar stays in a healthy range.  If your feet feel numb: ? Check for redness, warmth, and swelling every day. ? Wear padded socks and comfortable shoes. These help protect your feet.  Keep all follow-up visits as told by your doctor. This is important. Contact a doctor if:  You have paresthesia that gets worse or does not go away.  Your burning or prickling feeling gets worse when you walk.  You have pain or cramps.  You feel dizzy.  You have a rash. Get help right away if  you:  Feel weak.  Have trouble walking or moving.  Have problems speaking, understanding, or seeing.  Feel confused.  Cannot control when you pee (urinate) or poop (have a bowel movement).  Lose feeling (have numbness) after an injury.  Have new weakness in an arm or leg.  Pass out (faint). Summary  Paresthesia is a burning or prickling feeling. It often happens in the hands, arms, legs, or feet.  In most cases, the feeling goes away in a short time and is not a sign of a serious problem.  If you have paresthesia that lasts a long time, you may need to be seen by your doctor. This information is not intended to replace advice given to you by your health care provider. Make sure you discuss any questions you have with your health care provider. Document Revised: 03/17/2018 Document Reviewed: 02/28/2017 Elsevier Patient Education  2020 [employer]vier Inc.  

## 2019-10-28 LAB — HEMOGLOBIN A1C: Hgb A1c MFr Bld: 5.8 % (ref 4.6–6.5)

## 2019-10-28 LAB — VITAMIN D 25 HYDROXY (VIT D DEFICIENCY, FRACTURES): VITD: 32.15 ng/mL (ref 30.00–100.00)

## 2019-10-28 LAB — VITAMIN B12: Vitamin B-12: 440 pg/mL (ref 211–911)

## 2019-10-29 LAB — LYME AB/WESTERN BLOT REFLEX
LYME DISEASE AB, QUANT, IGM: <0.8 index (ref 0.00–0.79)
Lyme IgG/IgM Ab: <0.91 {ISR} (ref 0.00–0.90)

## 2019-11-27 ENCOUNTER — Encounter: Payer: Self-pay | Admitting: Internal Medicine

## 2019-12-17 ENCOUNTER — Ambulatory Visit: Payer: No Typology Code available for payment source | Admitting: Internal Medicine

## 2019-12-27 ENCOUNTER — Other Ambulatory Visit: Payer: Self-pay | Admitting: Internal Medicine

## 2020-01-27 ENCOUNTER — Other Ambulatory Visit: Payer: Self-pay | Admitting: Internal Medicine

## 2020-01-27 NOTE — Telephone Encounter (Signed)
Last filled 10/27/2019...Marland Kitchen please advise

## 2020-03-27 ENCOUNTER — Other Ambulatory Visit: Payer: Self-pay | Admitting: Internal Medicine

## 2020-03-29 ENCOUNTER — Encounter: Payer: No Typology Code available for payment source | Admitting: Internal Medicine

## 2020-04-04 ENCOUNTER — Telehealth: Payer: Self-pay

## 2020-04-04 ENCOUNTER — Emergency Department (HOSPITAL_COMMUNITY): Payer: Medicare Other

## 2020-04-04 ENCOUNTER — Emergency Department (HOSPITAL_COMMUNITY)
Admission: EM | Admit: 2020-04-04 | Discharge: 2020-04-04 | Disposition: A | Discharge status: Home / Self Care | Payer: Medicare Other | Attending: Emergency Medicine | Admitting: Emergency Medicine

## 2020-04-04 ENCOUNTER — Other Ambulatory Visit: Payer: Self-pay

## 2020-04-04 DIAGNOSIS — E876 Hypokalemia: Secondary | ICD-10-CM

## 2020-04-04 DIAGNOSIS — Z20822 Contact with and (suspected) exposure to covid-19: Secondary | ICD-10-CM | POA: Insufficient documentation

## 2020-04-04 DIAGNOSIS — E860 Dehydration: Secondary | ICD-10-CM | POA: Insufficient documentation

## 2020-04-04 DIAGNOSIS — Z87891 Personal history of nicotine dependence: Secondary | ICD-10-CM | POA: Diagnosis not present

## 2020-04-04 DIAGNOSIS — I10 Essential (primary) hypertension: Secondary | ICD-10-CM | POA: Insufficient documentation

## 2020-04-04 DIAGNOSIS — R000 Tachycardia, unspecified: Secondary | ICD-10-CM | POA: Insufficient documentation

## 2020-04-04 DIAGNOSIS — R0789 Other chest pain: Secondary | ICD-10-CM | POA: Insufficient documentation

## 2020-04-04 DIAGNOSIS — R0682 Tachypnea, not elsewhere classified: Secondary | ICD-10-CM | POA: Insufficient documentation

## 2020-04-04 DIAGNOSIS — Z79899 Other long term (current) drug therapy: Secondary | ICD-10-CM | POA: Diagnosis not present

## 2020-04-04 LAB — CK: Total CK: 44 U/L (ref 38–234)

## 2020-04-04 LAB — CBC WITH DIFFERENTIAL/PLATELET
Abs Immature Granulocytes: 0.08 10*3/uL — ABNORMAL HIGH (ref 0.00–0.07)
Basophils Absolute: 0.1 10*3/uL (ref 0.0–0.1)
Basophils Relative: 0 %
Eosinophils Absolute: 0 10*3/uL (ref 0.0–0.5)
Eosinophils Relative: 0 %
HCT: 46.1 % — ABNORMAL HIGH (ref 36.0–46.0)
Hemoglobin: 15.8 g/dL — ABNORMAL HIGH (ref 12.0–15.0)
Immature Granulocytes: 1 %
Lymphocytes Relative: 15 %
Lymphs Abs: 2.3 10*3/uL (ref 0.7–4.0)
MCH: 30.6 pg (ref 26.0–34.0)
MCHC: 34.3 g/dL (ref 30.0–36.0)
MCV: 89.3 fL (ref 80.0–100.0)
Monocytes Absolute: 1.1 10*3/uL — ABNORMAL HIGH (ref 0.1–1.0)
Monocytes Relative: 7 %
Neutro Abs: 11.4 10*3/uL — ABNORMAL HIGH (ref 1.7–7.7)
Neutrophils Relative %: 77 %
Platelets: 418 10*3/uL — ABNORMAL HIGH (ref 150–400)
RBC: 5.16 MIL/uL — ABNORMAL HIGH (ref 3.87–5.11)
RDW: 12.6 % (ref 11.5–15.5)
WBC: 14.9 10*3/uL — ABNORMAL HIGH (ref 4.0–10.5)
nRBC: 0 % (ref 0.0–0.2)

## 2020-04-04 LAB — COMPREHENSIVE METABOLIC PANEL
ALT: 16 U/L (ref 0–44)
AST: 19 U/L (ref 15–41)
Albumin: 4.3 g/dL (ref 3.5–5.0)
Alkaline Phosphatase: 56 U/L (ref 38–126)
Anion gap: 16 — ABNORMAL HIGH (ref 5–15)
BUN: 14 mg/dL (ref 8–23)
CO2: 20 mmol/L — ABNORMAL LOW (ref 22–32)
Calcium: 10.1 mg/dL (ref 8.9–10.3)
Chloride: 95 mmol/L — ABNORMAL LOW (ref 98–111)
Creatinine, Ser: 0.83 mg/dL (ref 0.44–1.00)
GFR, Estimated: >60 mL/min (ref >60)
Glucose, Bld: 179 mg/dL — ABNORMAL HIGH (ref 70–99)
Potassium: 2.6 mmol/L — CL (ref 3.5–5.1)
Sodium: 131 mmol/L — ABNORMAL LOW (ref 135–145)
Total Bilirubin: 1.4 mg/dL — ABNORMAL HIGH (ref 0.3–1.2)
Total Protein: 6.6 g/dL (ref 6.5–8.1)

## 2020-04-04 LAB — RAPID URINE DRUG SCREEN, HOSP PERFORMED
Amphetamines: NOT DETECTED
Barbiturates: NOT DETECTED
Benzodiazepines: NOT DETECTED
Cocaine: NOT DETECTED
Opiates: NOT DETECTED
Tetrahydrocannabinol: POSITIVE — AB

## 2020-04-04 LAB — RESP PANEL BY RT-PCR (FLU A&B, COVID) ARPGX2
Influenza A by PCR: NEGATIVE
Influenza B by PCR: NEGATIVE
SARS Coronavirus 2 by RT PCR: NEGATIVE

## 2020-04-04 LAB — URINALYSIS, ROUTINE W REFLEX MICROSCOPIC
Bilirubin Urine: NEGATIVE
Glucose, UA: NEGATIVE mg/dL
Hgb urine dipstick: NEGATIVE
Ketones, ur: 20 mg/dL — AB
Leukocytes,Ua: NEGATIVE
Nitrite: NEGATIVE
Protein, ur: NEGATIVE mg/dL
Specific Gravity, Urine: 1.015 (ref 1.005–1.030)
pH: 7 (ref 5.0–8.0)

## 2020-04-04 LAB — LACTIC ACID, PLASMA
Lactic Acid, Venous: 1.5 mmol/L (ref 0.5–1.9)
Lactic Acid, Venous: 0.9 mmol/L (ref 0.5–1.9)

## 2020-04-04 LAB — POTASSIUM: Potassium: 3.1 mmol/L — ABNORMAL LOW (ref 3.5–5.1)

## 2020-04-04 LAB — TROPONIN I (HIGH SENSITIVITY)
Troponin I (High Sensitivity): 7 ng/L (ref <18)
Troponin I (High Sensitivity): 11 ng/L (ref <18)

## 2020-04-04 LAB — MAGNESIUM: Magnesium: 1.6 mg/dL — ABNORMAL LOW (ref 1.7–2.4)

## 2020-04-04 LAB — LIPASE, BLOOD: Lipase: 25 U/L (ref 11–51)

## 2020-04-04 LAB — BRAIN NATRIURETIC PEPTIDE: B Natriuretic Peptide: 14.2 pg/mL (ref 0.0–100.0)

## 2020-04-04 LAB — D-DIMER, QUANTITATIVE: D-Dimer, Quant: 0.29 ug/mL-FEU (ref 0.00–0.50)

## 2020-04-04 LAB — TSH: TSH: 0.229 u[IU]/mL — ABNORMAL LOW (ref 0.350–4.500)

## 2020-04-04 MED ORDER — SODIUM CHLORIDE 0.9 % IV SOLN: Freq: Once | INTRAVENOUS | Status: AC

## 2020-04-04 MED ORDER — LACTATED RINGERS IV BOLUS
1000.0000 mL | Freq: Once | INTRAVENOUS | Status: AC
  Administered 2020-04-04: 1000 mL via INTRAVENOUS

## 2020-04-04 MED ORDER — MAGNESIUM OXIDE 400 MG PO TABS: 400.0000 mg | ORAL_TABLET | Freq: Every day | ORAL | 0 refills | Status: DC

## 2020-04-04 MED ORDER — LORAZEPAM 2 MG/ML IJ SOLN
1.0000 mg | Freq: Once | INTRAMUSCULAR | Status: AC
  Administered 2020-04-04: 1 mg via INTRAVENOUS
  Filled 2020-04-04: qty 1

## 2020-04-04 MED ORDER — POTASSIUM CHLORIDE CRYS ER 20 MEQ PO TBCR
40.0000 meq | EXTENDED_RELEASE_TABLET | Freq: Once | ORAL | Status: AC
  Administered 2020-04-04: 40 meq via ORAL
  Filled 2020-04-04: qty 2

## 2020-04-04 MED ORDER — MAGNESIUM SULFATE 2 GM/50ML IV SOLN
2.0000 g | Freq: Once | INTRAVENOUS | Status: AC
  Administered 2020-04-04: 2 g via INTRAVENOUS
  Filled 2020-04-04: qty 50

## 2020-04-04 MED ORDER — POTASSIUM CHLORIDE CRYS ER 20 MEQ PO TBCR: 20.0000 meq | EXTENDED_RELEASE_TABLET | Freq: Two times a day (BID) | ORAL | 0 refills | Status: DC

## 2020-04-04 MED ORDER — POTASSIUM CHLORIDE 10 MEQ/100ML IV SOLN
10.0000 meq | Freq: Once | INTRAVENOUS | Status: AC
  Administered 2020-04-04: 10 meq via INTRAVENOUS
  Filled 2020-04-04: qty 100

## 2020-04-04 NOTE — Telephone Encounter (Signed)
[facility] - Client TELEPHONE ADVICE RECORD [facility] Patient Name: Anna Moore Gender: Female DOB: 01-22-1956 Age: 65 Y 1 M 1 D Return Phone Number: [phone] (Primary), [phone] (Secondary) Address: [address] City/State/Zip: [place] [address] Client [facility] - Client Client Site [facility] - Day Physician Webb Silversmith - NP Contact Type Call Who Is Calling Patient / Member / Family / Caregiver Call Type Triage / Clinical Relationship To Patient Self Return Phone Number [phone] (Secondary) Chief Complaint Heart palpitations or irregular heartbeat Reason for Call Symptomatic / Request for [place] states she has nausea, vomiting, night sweats, headache, shaking, fatigue and when she stand her has a rapid heartbeat. Caller states she does have a virtual appointment tomorrow. [place] Translation No Nurse Assessment Nurse: D'Heur Lucia Gaskins, RN, Adrienne Date/Time (Eastern Time): 04/04/2020 8:15:33 AM Confirm and document reason for call. If symptomatic, describe symptoms. ---Caller states she has nausea, vomiting, congestion, runny nose, night sweats, headache, shaking, fatigue and when she stands she has a rapid heartbeat often at night and sometimes when she stands. No fever. S/S started last week. Does the patient have any new or worsening symptoms? ---Yes Will a triage be completed? ---Yes Related visit to physician within the last 2 weeks? ---No Does the PT have any chronic conditions? (i.e. diabetes, asthma, this includes High risk factors for pregnancy, etc.) ---Yes List chronic conditions. ---HTN, hyperthyroidism Is this a behavioral health or substance abuse call? ---No Guidelines Guideline Title Affirmed Question Affirmed Notes Nurse Date/Time ([place] Time) COVID-19 - Diagnosed or  Suspected Sounds like a lifethreatening emergency to the triager D'Heur Lucia Gaskins, RN, Adrienne 04/04/2020 8:24:33 AM Disp. Time Eilene Ghazi Time) Disposition Final User 04/04/2020 8:34:31 AM 911 Outcome Documentation D'Heur Lucia Gaskins, RN, Adrienne Reason: Family to drive to ED 8/33/8250 8:32:41 AM Call EMS 911 Now Yes D'Heur Lucia Gaskins, RN, Adrienne PLEASE NOTE: All timestamps contained within this report are represented as [place] Standard Time. CONFIDENTIALTY NOTICE: This fax transmission is intended only for the addressee. It contains information that is legally privileged, confidential or otherwise protected from use or disclosure. If you are not the intended recipient, you are strictly prohibited from reviewing, disclosing, copying using or disseminating any of this information or taking any action in reliance on or regarding this information. If you have received this fax in error, please notify us immediately by telephone so that we can arrange for its return to [place]. Phone: [phone], Toll-Free: [phone], Fax: [phone] Page: 2 of 2 Call Id: [id number] [place] Disagree/Comply Comply Caller Understands Yes PreDisposition Call Doctor Care Advice Given Per Guideline CALL EMS 911 NOW: CARE ADVICE given per COVID-19 - DIAGNOSED OR SUSPECTED (Adult) guideline. Comments User: Vincente Liberty, D'Heur Lucia Gaskins, RN Date/Time Eilene Ghazi Time): 04/04/2020 8:24:13 AM Last normal BM last Tuesday morning but she has been eating little. Drinking fluids. This morning she has had a little diarrhea. User: Vincente Liberty, D'Heur Lucia Gaskins, RN Date/Time Eilene Ghazi Time): 04/04/2020 8:51:29 AM Called patient to check on status of her going to ED since husband had been out walking and was not present to take her. She has decided to go ahead and call 911. Referrals GO TO FACILITY OTHER - SPECIFY

## 2020-04-04 NOTE — Telephone Encounter (Signed)
Will review ER note 

## 2020-04-04 NOTE — Telephone Encounter (Signed)
Per chart review tab pt is at [facility] ED now;sending note to Avie Echevaria NP.

## 2020-04-04 NOTE — ED Triage Notes (Signed)
Pt BIBA from home. Pt has had flu like symptoms for a week with nausea and vomiting and generalized malaise. Pt has had night sweats with chest congestion/pressure. Poor PO intake too. Pt's HR 130 BPM on monitor.

## 2020-04-04 NOTE — ED Provider Notes (Signed)
[facility] EMERGENCY DEPARTMENT Provider Note   CSN: [id number] Arrival date & time: 04/04/20  9485     History Chief Complaint  Patient presents with  . Chest Pain    Anna Moore is a 65 y.o. female.  HPI She reports that she started getting sick about a week ago.  She has had generalized body aches.  Recurrent episodes of vomiting.  Minimal diarrhea.  Patient reports she has some upper respiratory congestion but that seems to have improved.  Mild amount of coughing.  Patient is experiencing some generalized chest pressure.  Reports she has been feeling like she needs to take a deep breath to breathe well.  Patient reports while the most disconcerting symptoms is that her heart races.  No swelling of the legs.  Patient does not smoke cigarettes.  Occasional marijuana use.  1 dose of [employer] Covid vaccine.  Patient reports she has had some similar events about every 6 months.  No history of PE.  She reports she does have some anxiety.  She occasionally takes alprazolam 0.25 mg.  She reports typically does not take it more frequently than once a week.  No continuous use.  Patient has hypertension and is on hydrochlorothiazide 25 mg and amlodipine 10 mg.  Patient adds that before she has had a UTI with similar symptoms but was not symptomatic with burning urgency.    Past Medical History:  Diagnosis Date  . Anxiety   . DJD (degenerative joint disease)    Of the back  . Hypertension     Patient Active Problem List   Diagnosis Date Noted  . GAD (generalized anxiety disorder) 10/06/2019  . Panic attacks 10/11/2016  . Postmenopausal HRT (hormone replacement therapy) 07/28/2014  . Hyperlipidemia 05/20/2014  . Hyperthyroidism 09/22/2013  . Hypertension   . Anxiety   . DJD (degenerative joint disease)     Past Surgical History:  Procedure Laterality Date  . ABDOMINAL HYSTERECTOMY    . BACK SURGERY  01/2008   On L4 and L5  . NASAL SEPTUM SURGERY     . OTHER SURGICAL HISTORY  1998   Hysterectomy  . PAROTIDECTOMY  1990     OB History   No obstetric history on file.     Family History  Problem Relation Age of Onset  . Atrial fibrillation Mother   . Hypertension Father   . Heart disease Father   . Heart attack Father        73's  . Cancer Father        non hodgkins lymphoma  . Diabetes Father   . Colon cancer Neg Hx   . Thyroid disease Neg Hx     Social History   Tobacco Use  . Smoking status: Former Smoker    Types: Cigarettes    Quit date: 04/08/2016    Years since quitting: 3.9  . Smokeless tobacco: Never Used  Vaping Use  . Vaping Use: Never used  Substance Use Topics  . Alcohol use: Yes    Alcohol/week: 6.0 standard drinks    Types: 6 Cans of beer per week    Comment: 5 beers a week  . Drug use: No    Home Medications Prior to Admission medications   Medication Sig Start Date End Date Taking? Authorizing Provider  ALPRAZolam (XANAX) 0.25 MG tablet Take 1 tablet (0.25 mg total) by mouth daily as needed. for anxiety 01/27/20   Jearld Fenton, NP  amLODipine (NORVASC) 10  MG tablet Take 1 tablet by mouth once daily 03/30/20   Jearld Fenton, NP  amoxicillin-clavulanate (AUGMENTIN) 875-125 MG tablet Take 1 tablet by mouth 2 (two) times daily. 10/01/19   Bedsole, Amy E, MD  estradiol (ESTRACE) 1 MG tablet Take 1 tablet (1 mg total) by mouth 2 (two) times daily. 10/15/19   Jearld Fenton, NP  gabapentin (NEURONTIN) 300 MG capsule Take 1 capsule by mouth at bedtime as needed.    [provider]  hydrochlorothiazide (HYDRODIURIL) 25 MG tablet Take 1 tablet (25 mg total) by mouth daily. 04/30/19   Jearld Fenton, NP  methimazole (TAPAZOLE) 5 MG tablet TAKE 1 TABLET BY MOUTH THREE TIMES A WEEK 03/30/20   Jearld Fenton, NP  zolpidem (AMBIEN) 5 MG tablet TAKE 1 TABLET BY MOUTH AT BEDTIME AS NEEDED FOR SLEEP 04/08/19   Lucille Passy, MD    Allergies    Patient has no known allergies.  Review of Systems    Review of Systems 10 systems reviewed and negative except as per HPI Physical Exam Updated Vital Signs BP 127/72   Pulse 84   Temp 98.3 F (36.8 C) (Oral)   Resp 17   SpO2 98%   Physical Exam Constitutional:      Comments: Alert and nontoxic.  Well-nourished well-developed.  Slightly tremulous and tachypneic.  HENT:     Head: Normocephalic and atraumatic.     Mouth/Throat:     Pharynx: Oropharynx is clear.  Eyes:     Extraocular Movements: Extraocular movements intact.  Cardiovascular:     Comments: Tachycardia.  No gross rub murmur gallop Pulmonary:     Comments: Tachypnea.  Lungs are grossly clear. Abdominal:     General: There is no distension.     Palpations: Abdomen is soft.     Tenderness: There is no abdominal tenderness. There is no guarding.  Musculoskeletal:        General: No swelling or tenderness. Normal range of motion.     Right lower leg: No edema.     Left lower leg: No edema.     Comments: Condition of lower extremities very good.  No peripheral edema.  Calves soft and nontender.  Neurological:     General: No focal deficit present.     Mental Status: She is oriented to person, place, and time.     Cranial Nerves: No cranial nerve deficit.     Motor: No weakness.     Coordination: Coordination normal.  Psychiatric:     Comments: Mild to moderately anxious in appearance.     ED Results / Procedures / Treatments   Labs (all labs ordered are listed, but only abnormal results are displayed) Labs Reviewed  COMPREHENSIVE METABOLIC PANEL - Abnormal; Notable for the following components:      Result Value   Sodium 131 (*)    Potassium 2.6 (*)    Chloride 95 (*)    CO2 20 (*)    Glucose, Bld 179 (*)    Total Bilirubin 1.4 (*)    Anion gap 16 (*)    All other components within normal limits  CBC WITH DIFFERENTIAL/PLATELET - Abnormal; Notable for the following components:   WBC 14.9 (*)    RBC 5.16 (*)    Hemoglobin 15.8 (*)    HCT 46.1 (*)     Platelets 418 (*)    Neutro Abs 11.4 (*)    Monocytes Absolute 1.1 (*)    Abs Immature Granulocytes  0.08 (*)    All other components within normal limits  MAGNESIUM - Abnormal; Notable for the following components:   Magnesium 1.6 (*)    All other components within normal limits  TSH - Abnormal; Notable for the following components:   TSH 0.229 (*)    All other components within normal limits  POTASSIUM - Abnormal; Notable for the following components:   Potassium 3.1 (*)    All other components within normal limits  RESPIRATORY PANEL BY RT PCR (FLU A&B, COVID)  RESP PANEL BY RT-PCR (FLU A&B, COVID) ARPGX2  LIPASE, BLOOD  BRAIN NATRIURETIC PEPTIDE  LACTIC ACID, PLASMA  LACTIC ACID, PLASMA  CK  D-DIMER, QUANTITATIVE (NOT AT [facility])  URINALYSIS, ROUTINE W REFLEX MICROSCOPIC  RAPID URINE DRUG SCREEN, HOSP PERFORMED  TROPONIN I (HIGH SENSITIVITY)  TROPONIN I (HIGH SENSITIVITY)    EKG EKG Interpretation  Date/Time:  Monday April 04 2020 09:55:55 EST Ventricular Rate:  128 PR Interval:    QRS Duration: 94 QT Interval:  305 QTC Calculation: 445 R Axis:   26 Text Interpretation: Sinus tachycardia Probable left atrial enlargement Anteroseptal infarct, age indeterminate increased rate but otherwise no sig change from previous Confirmed by Charlesetta Shanks [phone]) on 04/04/2020 9:58:49 AM   Radiology DG Chest Port 1 View  Result Date: 04/04/2020 CLINICAL DATA:  Shortness of breath and chest pain, flu like symptoms. Nausea and vomiting. EXAM: PORTABLE CHEST 1 VIEW COMPARISON:  10/08/2016 chest radiograph and CT chest. FINDINGS: Trachea is midline. Heart size stable. Lungs are hyperinflated but clear. No pleural fluid. IMPRESSION: Hyperinflation without acute finding. Electronically Signed   By: Lorin Picket M.D.   On: 04/04/2020 10:38    Procedures Procedures   Medications Ordered in ED Medications  magnesium sulfate IVPB 2 g 50 mL (2 g Intravenous New Bag/Given 04/04/20 1456)   LORazepam (ATIVAN) injection 1 mg (1 mg Intravenous Given 04/04/20 1024)  0.9 %  sodium chloride infusion ( Intravenous New Bag/Given 04/04/20 1023)  potassium chloride SA (KLOR-CON) CR tablet 40 mEq (40 mEq Oral Given 04/04/20 1208)  potassium chloride 10 mEq in 100 mL IVPB (0 mEq Intravenous Stopped 04/04/20 1337)  potassium chloride SA (KLOR-CON) CR tablet 40 mEq (40 mEq Oral Given 04/04/20 1454)  lactated ringers bolus 1,000 mL (1,000 mLs Intravenous New Bag/Given 04/04/20 1458)    ED Course  I have reviewed the triage vital signs and the nursing notes.  Pertinent labs & imaging results that were available during my care of the patient were reviewed by me and considered in my medical decision making (see chart for details).    MDM Rules/Calculators/A&P                         Recheck: Significant improvement with hydration and electrolyte replacement.  Patient's heart rate is now in the 80s.  She is no longer tachypneic.  Ambulating comfortably in the hallway without difficulty.  Patient arrives with a week course of general illness starting with recurrent episodes of vomiting for a number of days.  She does have hypokalemia and mild hypomagnesemia.  Electrolyte replacement and fluid replacement initiated.  Patient also given Ativan 1 mg for anxiety.  Significant improvement with these interventions.  D-dimer not elevated, troponin no elevation, chest x-ray clear, influenza and Covid are negative.  At this time, with significant improvement with above interventions I suspect this is due to dehydration from recurrent vomiting and hyperventilation with exacerbation of hypokalemia.  Patient describes  occasional marijuana use for chronic back pain.  With occasional use, lower probability this would represent cannabinoid hyperemesis.  We did review this is a possibility for patient to keep in mind.  Also, patient reports occasional use of alprazolam.  It does not sound as though there is habitual use  that would lead to withdrawal to precipitate symptoms.  I still suspect most likely viral illness with vomiting and body aches early in the week but this appears to be resolving.  At this time, with patient showing significant improvement with hydration and electrolyte replacement, will plan to continue oral potassium, hydration and rest at home and close follow-up with PCP within the next 2 to 3 days to monitor potassium, electrolytes and general condition.  Patient's TSH is slightly low.  She will increase her methimazole to 4 tablets a week and monitor this closely with her PCP and endocrinologist.  Return precautions reviewed. Final Clinical Impression(s) / ED Diagnoses Final diagnoses:  Tachycardia  Atypical chest pain  Dehydration  Hypokalemia  Hypomagnesemia    Rx / DC Orders ED Discharge Orders    None       Charlesetta Shanks, MD 04/04/20 1523

## 2020-04-04 NOTE — ED Notes (Addendum)
Intended to validate vitals ONLY at 1600, but mistakenly validated several hours worth.

## 2020-04-04 NOTE — Discharge Instructions (Addendum)
1.  Increase your methimazole to 4 tablets a week.  Take a dose today when you go home. 2.  Your potassium and magnesium were low.  You are being prescribed potassium.  Take a 20 mEq tablet twice a day.  Take a daily dose of magnesium as prescribed. 3.  Try to stay hydrated and eat frequent small nutritious meals. 4.  For constipation you can continue to take Dulcolax and a daily dose of MiraLAX per package instructions. 5.  Follow-up with your doctor within the next 2 to 3 days for recheck of your lab work and general condition. 6.  Return to the emergency department if you feel that you are having recurrence of racing heart, chest pain or other concerning symptoms.

## 2020-04-05 ENCOUNTER — Telehealth: Payer: No Typology Code available for payment source | Admitting: Internal Medicine

## 2020-04-11 ENCOUNTER — Ambulatory Visit (INDEPENDENT_AMBULATORY_CARE_PROVIDER_SITE_OTHER): Payer: No Typology Code available for payment source | Admitting: Internal Medicine

## 2020-04-11 ENCOUNTER — Encounter: Payer: Self-pay | Admitting: Internal Medicine

## 2020-04-11 ENCOUNTER — Other Ambulatory Visit: Payer: Self-pay

## 2020-04-11 VITALS — BP 124/72 | HR 80 | Temp 97.6°F | Wt 134.0 lb

## 2020-04-11 DIAGNOSIS — E876 Hypokalemia: Secondary | ICD-10-CM | POA: Diagnosis not present

## 2020-04-11 DIAGNOSIS — B349 Viral infection, unspecified: Secondary | ICD-10-CM

## 2020-04-11 DIAGNOSIS — E0590 Thyrotoxicosis, unspecified without thyrotoxic crisis or storm: Secondary | ICD-10-CM

## 2020-04-11 DIAGNOSIS — D72829 Elevated white blood cell count, unspecified: Secondary | ICD-10-CM

## 2020-04-11 MED ORDER — ALPRAZOLAM 0.25 MG PO TABS: 0.2500 mg | ORAL_TABLET | Freq: Every day | ORAL | 0 refills | Status: AC | PRN

## 2020-04-11 NOTE — Progress Notes (Signed)
Subjective:    Patient ID: Anna Moore, female    DOB: 05/01/1955, 65 y.o.   MRN: [id number]  HPI  Patient presents the clinic today for ER follow-up.  She went to the ER alone 1/31 with complaint of nausea and vomiting x1 week.  Sodium, potassium and magnesium were low.  White count was slightly elevated.  EKG showed sinus tachycardia with possible left atrial enlargement.  Chest x-ray was negative.  Flu and Covid test were negative.  She was given IV fluids and electrolytes, with significant improvement in symptoms.  Her TSH was marginally low at 0.22.  Her Methimazole was increased to 5 mg 4 times a week.  She was discharged and advised to follow-up with her PCP.  Since discharge, she reports feeling significantly better.  Review of Systems      Past Medical History:  Diagnosis Date   Anxiety    DJD (degenerative joint disease)    Of the back   Hypertension     Current Outpatient Medications  Medication Sig Dispense Refill   ALPRAZolam (XANAX) 0.25 MG tablet Take 1 tablet (0.25 mg total) by mouth daily as needed. for anxiety 30 tablet 0   amLODipine (NORVASC) 10 MG tablet Take 1 tablet by mouth once daily 90 tablet 0   amoxicillin-clavulanate (AUGMENTIN) 875-125 MG tablet Take 1 tablet by mouth 2 (two) times daily. 20 tablet 0   estradiol (ESTRACE) 1 MG tablet Take 1 tablet (1 mg total) by mouth 2 (two) times daily. 180 tablet 1   gabapentin (NEURONTIN) 300 MG capsule Take 1 capsule by mouth at bedtime as needed.     hydrochlorothiazide (HYDRODIURIL) 25 MG tablet Take 1 tablet (25 mg total) by mouth daily. 90 tablet 2   magnesium oxide (MAG-OX) 400 MG tablet Take 1 tablet (400 mg total) by mouth daily. 14 tablet 0   methimazole (TAPAZOLE) 5 MG tablet TAKE 1 TABLET BY MOUTH THREE TIMES A WEEK 36 tablet 0   potassium chloride SA (KLOR-CON) 20 MEQ tablet Take 1 tablet (20 mEq total) by mouth 2 (two) times daily. 14 tablet 0   zolpidem (AMBIEN) 5 MG tablet TAKE 1  TABLET BY MOUTH AT BEDTIME AS NEEDED FOR SLEEP 90 tablet 1   No current facility-administered medications for this visit.    No Known Allergies  Family History  Problem Relation Age of Onset   Atrial fibrillation Mother    Hypertension Father    Heart disease Father    Heart attack Father        22's   Cancer Father        non hodgkins lymphoma   Diabetes Father    Colon cancer Neg Hx    Thyroid disease Neg Hx     Social History   Socioeconomic History   Marital status: Married    Spouse name: Not on file   Number of children: Not on file   Years of education: Not on file   Highest education level: Not on file  Occupational History   Not on file  Tobacco Use   Smoking status: Former Smoker    Types: Cigarettes    Quit date: 04/08/2016    Years since quitting: 4.0   Smokeless tobacco: Never Used  Vaping Use   Vaping Use: Never used  Substance and Sexual Activity   Alcohol use: Yes    Alcohol/week: 6.0 standard drinks    Types: 6 Cans of beer per week    Comment: 5 beers  a week   Drug use: No   Sexual activity: Not on file  Other Topics Concern   Not on file  Social History Narrative   Married.  One daughter, 9 yo- moving back in with her.   Retired Medical illustrator.   Social Determinants of Health   Financial Resource Strain: Not on file  Food Insecurity: Not on file  Transportation Needs: Not on file  Physical Activity: Not on file  Stress: Not on file  Social Connections: Not on file  Intimate Partner Violence: Not on file     Constitutional: Denies fever, malaise, fatigue, headache or abrupt weight changes.  HEENT: Denies eye pain, eye redness, ear pain, ringing in the ears, wax buildup, runny nose, nasal congestion, bloody nose, or sore throat. Respiratory: Denies difficulty breathing, shortness of breath, cough or sputum production.   Cardiovascular: Denies chest pain, chest tightness, palpitations or swelling in the hands or  feet.  Gastrointestinal: Denies abdominal pain, bloating, constipation, diarrhea or blood in the stool.  Neurological: Denies dizziness, difficulty with memory, difficulty with speech or problems with balance and coordination.  Psych: Patient has a history of anxiety.  Denies depression, SI/HI.  No other specific complaints in a complete review of systems (except as listed in HPI above).  Objective:   Physical Exam BP 124/72    Pulse 80    Temp 97.6 F (36.4 C) (Temporal)    Wt 134 lb (60.8 kg)    SpO2 98%    BMI 21.63 kg/m   Wt Readings from Last 3 Encounters:  10/27/19 134 lb (60.8 kg)  10/06/19 137 lb 8 oz (62.4 kg)  10/01/19 137 lb (62.1 kg)    General: Appears her stated age, well developed, well nourished in NAD. Neck:  Neck supple, trachea midline. No masses, lumps present.  Cardiovascular: Normal rate and rhythm. S1,S2 noted.  No murmur, rubs or gallops noted. No JVD or BLE edema.  Pulmonary/Chest: Normal effort and positive vesicular breath sounds. No respiratory distress. No wheezes, rales or ronchi noted.  Abdomen: Soft and nontender. Normal bowel sounds. No distention or masses noted.  Musculoskeletal:  No difficulty with gait.  Neurological: Alert and oriented.  Psychiatric: Mildly anxious appearing. Behavior is normal. Judgment and thought content normal.     BMET    Component Value Date/Time   NA 131 (L) 04/04/2020 1000   K 3.1 (L) 04/04/2020 1328   CL 95 (L) 04/04/2020 1000   CO2 20 (L) 04/04/2020 1000   GLUCOSE 179 (H) 04/04/2020 1000   BUN 14 04/04/2020 1000   CREATININE 0.83 04/04/2020 1000   CREATININE 0.66 08/27/2018 1108   CALCIUM 10.1 04/04/2020 1000   GFRNONAA >60 04/04/2020 1000   GFRAA >60 10/08/2016 0949    Lipid Panel     Component Value Date/Time   CHOL 254 (H) 08/27/2018 1108   TRIG 128 08/27/2018 1108   HDL 85 08/27/2018 1108   CHOLHDL 3.0 08/27/2018 1108   VLDL 14.0 08/07/2017 1056   LDLCALC 144 (H) 08/27/2018 1108    CBC     Component Value Date/Time   WBC 14.9 (H) 04/04/2020 1000   RBC 5.16 (H) 04/04/2020 1000   HGB 15.8 (H) 04/04/2020 1000   HCT 46.1 (H) 04/04/2020 1000   PLT 418 (H) 04/04/2020 1000   MCV 89.3 04/04/2020 1000   MCH 30.6 04/04/2020 1000   MCHC 34.3 04/04/2020 1000   RDW 12.6 04/04/2020 1000   LYMPHSABS 2.3 04/04/2020 1000   MONOABS 1.1 (  H) 04/04/2020 1000   EOSABS 0.0 04/04/2020 1000   BASOSABS 0.1 04/04/2020 1000    Hgb A1C Lab Results  Component Value Date   HGBA1C 5.8 10/27/2019           Assessment & Plan:  ER follow-up for Viral Illness, Elevated WBC, Hypomagnesia, Hypokalemia and Hyperthyroidism:  ER notes, labs and imaging reviewed Would avoid adjusting methimazole at this time We will repeat CBC, c-Met, magnesium and TSH today Xanax refilled per patient request  We will follow-up after labs, return precautions discussed  Webb Silversmith, NP This visit occurred during the SARS-CoV-2 public health emergency.  Safety protocols were in place, including screening questions prior to the visit, additional usage of staff PPE, and extensive cleaning of exam room while observing appropriate contact time as indicated for disinfecting solutions.

## 2020-04-12 ENCOUNTER — Other Ambulatory Visit: Payer: Self-pay | Admitting: Internal Medicine

## 2020-04-12 LAB — CBC
HCT: 41.7 % (ref 36.0–46.0)
Hemoglobin: 14.4 g/dL (ref 12.0–15.0)
MCHC: 34.6 g/dL (ref 30.0–36.0)
MCV: 93.2 fl (ref 78.0–100.0)
Platelets: 374 10*3/uL (ref 150.0–400.0)
RBC: 4.47 Mil/uL (ref 3.87–5.11)
RDW: 13.6 % (ref 11.5–15.5)
WBC: 8.8 10*3/uL (ref 4.0–10.5)

## 2020-04-12 LAB — BASIC METABOLIC PANEL
BUN: 20 mg/dL (ref 6–23)
CO2: 30 mEq/L (ref 19–32)
Calcium: 10.3 mg/dL (ref 8.4–10.5)
Chloride: 99 mEq/L (ref 96–112)
Creatinine, Ser: 0.75 mg/dL (ref 0.40–1.20)
GFR: 83.73 mL/min (ref >60.00)
Glucose, Bld: 97 mg/dL (ref 70–99)
Potassium: 4.2 mEq/L (ref 3.5–5.1)
Sodium: 135 mEq/L (ref 135–145)

## 2020-04-12 LAB — TSH: TSH: 0.6 u[IU]/mL (ref 0.35–4.50)

## 2020-04-12 LAB — MAGNESIUM: Magnesium: 2.1 mg/dL (ref 1.5–2.5)

## 2020-04-14 ENCOUNTER — Encounter: Payer: Self-pay | Admitting: Internal Medicine

## 2020-04-14 NOTE — Patient Instructions (Signed)
Viral Illness, Adult Viruses are tiny germs that can get into a person's body and cause illness. There are many different types of viruses, and they cause many types of illness. Viral illnesses can range from mild to severe. They can affect various parts of the body. Short-term conditions that are caused by a virus include colds and the flu (influenza). Long-term conditions that are caused by a virus include herpes, shingles, and HIV (human immunodeficiency virus) infection. A few viruses have been linked to certain cancers. What are the causes? Many types of viruses can cause illness. Viruses invade cells in your body, multiply, and cause the infected cells to work abnormally or die. When these cells die, they release more of the virus. When this happens, you develop symptoms of the illness, and the virus continues to spread to other cells. If the virus takes over the function of the cell, it can cause the cell to divide and grow out of control. This happens when a virus causes cancer. Different viruses get into the body in different ways. You can get a virus by:  Swallowing food or water that has come in contact with the virus (is contaminated).  Breathing in droplets that have been coughed or sneezed into the air by an infected person.  Touching a surface that has been contaminated with the virus and then touching your eyes, nose, or mouth.  Being bitten by an insect or animal that carries the virus.  Having sexual contact with a person who is infected with the virus.  Being exposed to blood or fluids that contain the virus, either through an open cut or during a transfusion. If a virus enters your body, your body's defense system (immune system) will try to fight the virus. You may be at higher risk for a viral illness if your immune system is weak. What are the signs or symptoms? You may have these symptoms, depending on the type of virus and the location of the cells that it  invades:  Cold and flu viruses: ? Fever. ? Headache. ? Sore throat. ? Muscle aches. ? Stuffy nose (nasal congestion). ? Cough.  Digestive system (gastrointestinal) viruses: ? Fever. ? Pain in the abdomen. ? Nausea. ? Diarrhea.  Liver viruses (hepatitis): ? Loss of appetite. ? Tiredness. ? Skin or the white parts of your eyes turning yellow (jaundice).  Brain and spinal cord viruses: ? Fever. ? Headache. ? Stiff neck. ? Nausea and vomiting. ? Confusion or sleepiness.  Skin viruses: ? Warts. ? Itching. ? Rash.  Sexually transmitted viruses: ? Discharge. ? Swelling. ? Redness. ? Rash. How is this diagnosed? This condition may be diagnosed based on one or more of the following:  Symptoms.  Medical history.  Physical exam.  Blood test, sample of mucus from your lungs (sputum sample), stool sample, or a swab of body fluids or a skin sore (lesion). How is this treated? Viruses can be hard to treat because they live within cells. Antibiotic medicines do not treat viruses because these medicines do not get inside cells. Treatment for a viral illness may include:  Resting and drinking plenty of fluids.  Medicines to relieve symptoms. These can include over-the-counter medicine for pain and fever, medicines for cough or congestion, and medicines to relieve diarrhea.  Antiviral medicines. These medicines are available only for certain types of viruses. Some viral illnesses can be prevented with vaccinations. A common example is the flu shot. Follow these instructions at home: Medicines  Take over-the-counter and   prescription medicines only as told by your health care provider.  If you were prescribed an antiviral medicine, take it as told by your health care provider. Do not stop taking the antiviral even if you start to feel better.  Be aware of when antibiotics are needed and when they are not needed. Antibiotics do not treat viruses. You may get an antibiotic if  your health care provider thinks that you may have, or are at risk for, a bacterial infection and you have a viral infection. ? Do not ask for an antibiotic prescription if you have been diagnosed with a viral illness. Antibiotics will not make your illness go away faster. ? Frequently taking antibiotics when they are not needed can lead to antibiotic resistance. When this develops, the medicine no longer works against the bacteria that it normally fights. General instructions  Drink enough fluids to keep your urine pale yellow.  Rest as much as possible.  Return to your normal activities as told by your health care provider. Ask your health care provider what activities are safe for you.  Keep all follow-up visits as told by your health care provider. This is important.   How is this prevented? To reduce your risk of viral illness:  Wash your hands often with soap and water for at least 20 seconds. If soap and water are not available, use hand sanitizer.  Avoid touching your nose, eyes, and mouth, especially if you have not washed your hands recently.  If anyone in your household has a viral infection, clean all household surfaces that may have been in contact with the virus. Use soap and hot water. You may also use bleach that you have added water to (diluted).  Stay away from people who are sick with symptoms of a viral infection.  Do not share items such as toothbrushes and water bottles with other people.  Keep your vaccinations up to date. This includes getting a yearly flu shot.  Eat a healthy diet and get plenty of rest.   Contact a health care provider if:  You have symptoms of a viral illness that do not go away.  Your symptoms come back after going away.  Your symptoms get worse. Get help right away if you have:  Trouble breathing.  A severe headache or a stiff neck.  Severe vomiting or pain in your abdomen. These symptoms may represent a serious problem that is  an emergency. Do not wait to see if the symptoms will go away. Get medical help right away. Call your local emergency services (911 in the [place] Do not drive yourself to the hospital. Summary  Viruses are types of germs that can get into a person's body and cause illness. Viral illnesses can range from mild to severe. They can affect various parts of the body.  Viruses can be hard to treat. There are medicines to relieve symptoms, and there are some antiviral medicines.  If you were prescribed an antiviral medicine, take it as told by your health care provider. Do not stop taking the antiviral even if you start to feel better.  Contact a health care provider if you have symptoms of a viral illness that do not go away. This information is not intended to replace advice given to you by your health care provider. Make sure you discuss any questions you have with your health care provider. Document Revised: 07/06/2019 Document Reviewed: 12/30/2018 Elsevier Patient Education  2021 [employer]  

## 2020-04-15 ENCOUNTER — Encounter: Payer: Self-pay | Admitting: Internal Medicine

## 2020-05-02 ENCOUNTER — Other Ambulatory Visit: Payer: Self-pay | Admitting: Internal Medicine

## 2020-06-02 ENCOUNTER — Encounter: Payer: No Typology Code available for payment source | Admitting: Internal Medicine

## 2020-06-07 ENCOUNTER — Emergency Department (HOSPITAL_COMMUNITY)
Admission: EM | Admit: 2020-06-07 | Discharge: 2020-06-07 | Disposition: A | Discharge status: Home / Self Care | Payer: Medicare Other | Attending: Emergency Medicine | Admitting: Emergency Medicine

## 2020-06-07 DIAGNOSIS — R000 Tachycardia, unspecified: Secondary | ICD-10-CM | POA: Insufficient documentation

## 2020-06-07 DIAGNOSIS — R112 Nausea with vomiting, unspecified: Secondary | ICD-10-CM | POA: Diagnosis present

## 2020-06-07 DIAGNOSIS — E039 Hypothyroidism, unspecified: Secondary | ICD-10-CM | POA: Diagnosis not present

## 2020-06-07 DIAGNOSIS — Z20822 Contact with and (suspected) exposure to covid-19: Secondary | ICD-10-CM | POA: Insufficient documentation

## 2020-06-07 DIAGNOSIS — E860 Dehydration: Secondary | ICD-10-CM | POA: Diagnosis not present

## 2020-06-07 DIAGNOSIS — Z87891 Personal history of nicotine dependence: Secondary | ICD-10-CM | POA: Diagnosis not present

## 2020-06-07 DIAGNOSIS — E876 Hypokalemia: Secondary | ICD-10-CM | POA: Insufficient documentation

## 2020-06-07 DIAGNOSIS — I10 Essential (primary) hypertension: Secondary | ICD-10-CM | POA: Insufficient documentation

## 2020-06-07 DIAGNOSIS — Z79899 Other long term (current) drug therapy: Secondary | ICD-10-CM | POA: Diagnosis not present

## 2020-06-07 LAB — RESP PANEL BY RT-PCR (FLU A&B, COVID) ARPGX2
Influenza A by PCR: NEGATIVE
Influenza B by PCR: NEGATIVE
SARS Coronavirus 2 by RT PCR: NEGATIVE

## 2020-06-07 LAB — CBC WITH DIFFERENTIAL/PLATELET
Abs Immature Granulocytes: 0.07 10*3/uL (ref 0.00–0.07)
Basophils Absolute: 0.1 10*3/uL (ref 0.0–0.1)
Basophils Relative: 0 %
Eosinophils Absolute: 0 10*3/uL (ref 0.0–0.5)
Eosinophils Relative: 0 %
HCT: 43.5 % (ref 36.0–46.0)
Hemoglobin: 15.1 g/dL — ABNORMAL HIGH (ref 12.0–15.0)
Immature Granulocytes: 1 %
Lymphocytes Relative: 9 %
Lymphs Abs: 1.2 10*3/uL (ref 0.7–4.0)
MCH: 32.1 pg (ref 26.0–34.0)
MCHC: 34.7 g/dL (ref 30.0–36.0)
MCV: 92.6 fL (ref 80.0–100.0)
Monocytes Absolute: 1.2 10*3/uL — ABNORMAL HIGH (ref 0.1–1.0)
Monocytes Relative: 8 %
Neutro Abs: 11.6 10*3/uL — ABNORMAL HIGH (ref 1.7–7.7)
Neutrophils Relative %: 82 %
Platelets: 378 10*3/uL (ref 150–400)
RBC: 4.7 MIL/uL (ref 3.87–5.11)
RDW: 14 % (ref 11.5–15.5)
WBC: 14.2 10*3/uL — ABNORMAL HIGH (ref 4.0–10.5)
nRBC: 0 % (ref 0.0–0.2)

## 2020-06-07 LAB — URINALYSIS, ROUTINE W REFLEX MICROSCOPIC
Bilirubin Urine: NEGATIVE
Glucose, UA: NEGATIVE mg/dL
Hgb urine dipstick: NEGATIVE
Ketones, ur: 20 mg/dL — AB
Leukocytes,Ua: NEGATIVE
Nitrite: NEGATIVE
Protein, ur: NEGATIVE mg/dL
Specific Gravity, Urine: 1.004 — ABNORMAL LOW (ref 1.005–1.030)
pH: 7 (ref 5.0–8.0)

## 2020-06-07 LAB — COMPREHENSIVE METABOLIC PANEL
ALT: 15 U/L (ref 0–44)
AST: 16 U/L (ref 15–41)
Albumin: 3.9 g/dL (ref 3.5–5.0)
Alkaline Phosphatase: 69 U/L (ref 38–126)
Anion gap: 9 (ref 5–15)
BUN: 7 mg/dL — ABNORMAL LOW (ref 8–23)
CO2: 25 mmol/L (ref 22–32)
Calcium: 9.4 mg/dL (ref 8.9–10.3)
Chloride: 98 mmol/L (ref 98–111)
Creatinine, Ser: 0.7 mg/dL (ref 0.44–1.00)
GFR, Estimated: >60 mL/min (ref >60)
Glucose, Bld: 120 mg/dL — ABNORMAL HIGH (ref 70–99)
Potassium: 2.8 mmol/L — ABNORMAL LOW (ref 3.5–5.1)
Sodium: 132 mmol/L — ABNORMAL LOW (ref 135–145)
Total Bilirubin: 0.9 mg/dL (ref 0.3–1.2)
Total Protein: 6.2 g/dL — ABNORMAL LOW (ref 6.5–8.1)

## 2020-06-07 LAB — MAGNESIUM: Magnesium: 1.8 mg/dL (ref 1.7–2.4)

## 2020-06-07 LAB — LIPASE, BLOOD: Lipase: 30 U/L (ref 11–51)

## 2020-06-07 MED ORDER — POTASSIUM CHLORIDE CRYS ER 20 MEQ PO TBCR
40.0000 meq | EXTENDED_RELEASE_TABLET | Freq: Once | ORAL | Status: AC
  Administered 2020-06-07: 40 meq via ORAL
  Filled 2020-06-07: qty 2

## 2020-06-07 MED ORDER — POTASSIUM CHLORIDE 10 MEQ/100ML IV SOLN
10.0000 meq | Freq: Once | INTRAVENOUS | Status: AC
  Administered 2020-06-07: 10 meq via INTRAVENOUS
  Filled 2020-06-07: qty 100

## 2020-06-07 MED ORDER — SODIUM CHLORIDE 0.9 % IV BOLUS
1000.0000 mL | Freq: Once | INTRAVENOUS | Status: AC
  Administered 2020-06-07: 1000 mL via INTRAVENOUS

## 2020-06-07 MED ORDER — PROMETHAZINE HCL 25 MG RE SUPP: 25.0000 mg | Freq: Four times a day (QID) | RECTAL | 0 refills | Status: DC | PRN

## 2020-06-07 MED ORDER — PROMETHAZINE HCL 25 MG PO TABS: 25.0000 mg | ORAL_TABLET | Freq: Four times a day (QID) | ORAL | 0 refills | Status: AC | PRN

## 2020-06-07 MED ORDER — MAGNESIUM SULFATE 2 GM/50ML IV SOLN
2.0000 g | INTRAVENOUS | Status: AC
  Administered 2020-06-07: 2 g via INTRAVENOUS
  Filled 2020-06-07: qty 50

## 2020-06-07 MED ORDER — POTASSIUM CHLORIDE ER 10 MEQ PO TBCR: 10.0000 meq | EXTENDED_RELEASE_TABLET | Freq: Every day | ORAL | 0 refills | Status: DC

## 2020-06-07 MED ORDER — ONDANSETRON HCL 4 MG/2ML IJ SOLN
4.0000 mg | INTRAMUSCULAR | Status: AC
  Administered 2020-06-07: 4 mg via INTRAVENOUS
  Filled 2020-06-07: qty 2

## 2020-06-07 NOTE — ED Provider Notes (Signed)
[facility] EMERGENCY DEPARTMENT Provider Note   CSN: [id number] Arrival date & time: 06/07/20  1221     History Chief Complaint  Patient presents with  . Emesis    Anna Moore is a 65 y.o. female.  HPI   Patient is a 65 year old female, history of hypertension, she also has a history of hypothyroidism and hyperlipidemia, she is currently taking medications including gabapentin, Norvasc, Xanax as needed, methimazole, potassium supplementation as well as magnesium supplementation and hydrochlorothiazide.  She presents to the hospital with a complaint of increasing weakness with associated nausea vomiting and diarrhea.  Reports that this started over the last 24 hours but she has been sick with a mild upper respiratory cold type infection which she and her husband have both had for the last week or so.  No significant coughing but dealing with some posterior nasal drip.  She has had watery diarrhea yesterday but has not had any today because of her lack of being able to eat anything today.  Persistent nausea, history of hysterectomy but no other abdominal surgical history, has felt fevers and chills subjectively, no urinary symptoms but has had urinary tract infections in the past which made her nauseated.  Paramedics bring the patient in, they placed an IV, no other significant medications were given prehospital, prehospital vital signs were unremarkable except for a mild tachycardia.  Past Medical History:  Diagnosis Date  . Anxiety   . DJD (degenerative joint disease)    Of the back  . Hypertension     Patient Active Problem List   Diagnosis Date Noted  . GAD (generalized anxiety disorder) 10/06/2019  . Panic attacks 10/11/2016  . Postmenopausal HRT (hormone replacement therapy) 07/28/2014  . Hyperlipidemia 05/20/2014  . Hyperthyroidism 09/22/2013  . Hypertension   . Anxiety   . DJD (degenerative joint disease)     Past Surgical History:  Procedure  Laterality Date  . ABDOMINAL HYSTERECTOMY    . BACK SURGERY  01/2008   On L4 and L5  . NASAL SEPTUM SURGERY    . OTHER SURGICAL HISTORY  1998   Hysterectomy  . PAROTIDECTOMY  1990     OB History   No obstetric history on file.     Family History  Problem Relation Age of Onset  . Atrial fibrillation Mother   . Hypertension Father   . Heart disease Father   . Heart attack Father        39's  . Cancer Father        non hodgkins lymphoma  . Diabetes Father   . Colon cancer Neg Hx   . Thyroid disease Neg Hx     Social History   Tobacco Use  . Smoking status: Former Smoker    Types: Cigarettes    Quit date: 04/08/2016    Years since quitting: 4.1  . Smokeless tobacco: Never Used  Vaping Use  . Vaping Use: Never used  Substance Use Topics  . Alcohol use: Yes    Alcohol/week: 6.0 standard drinks    Types: 6 Cans of beer per week    Comment: 5 beers a week  . Drug use: No    Home Medications Prior to Admission medications   Medication Sig Start Date End Date Taking? Authorizing Provider  potassium chloride (KLOR-CON) 10 MEQ tablet Take 1 tablet (10 mEq total) by mouth daily. 06/07/20  Yes Noemi Chapel, MD  promethazine (PHENERGAN) 25 MG suppository Place 1 suppository (25 mg total) rectally every  6 (six) hours as needed for nausea or vomiting. 06/07/20  Yes Noemi Chapel, MD  promethazine (PHENERGAN) 25 MG tablet Take 1 tablet (25 mg total) by mouth every 6 (six) hours as needed for nausea or vomiting. 06/07/20  Yes Noemi Chapel, MD  ALPRAZolam Duanne Moron) 0.25 MG tablet Take 1 tablet (0.25 mg total) by mouth daily as needed. for anxiety 04/11/20   Jearld Fenton, NP  amLODipine (NORVASC) 10 MG tablet Take 1 tablet by mouth once daily 03/30/20   Jearld Fenton, NP  estradiol (ESTRACE) 1 MG tablet Take 1 tablet by mouth twice daily 04/13/20   Jearld Fenton, NP  gabapentin (NEURONTIN) 300 MG capsule Take 1 capsule by mouth at bedtime as needed.    [provider]   hydrochlorothiazide (HYDRODIURIL) 25 MG tablet Take 1 tablet by mouth once daily 05/02/20   Jearld Fenton, NP  magnesium oxide (MAG-OX) 400 MG tablet Take 1 tablet (400 mg total) by mouth daily. 04/04/20   Charlesetta Shanks, MD  methimazole (TAPAZOLE) 5 MG tablet TAKE 1 TABLET BY MOUTH THREE TIMES A WEEK 03/30/20   Baity, Coralie Keens, NP  potassium chloride SA (KLOR-CON) 20 MEQ tablet Take 1 tablet (20 mEq total) by mouth 2 (two) times daily. 04/04/20   Charlesetta Shanks, MD  zolpidem (AMBIEN) 5 MG tablet TAKE 1 TABLET BY MOUTH AT BEDTIME AS NEEDED FOR SLEEP 04/08/19   Lucille Passy, MD    Allergies    Nitrofuran derivatives  Review of Systems   Review of Systems  All other systems reviewed and are negative.   Physical Exam Updated Vital Signs BP (!) 155/79   Pulse 89   Temp 99.5 F (37.5 C) (Oral)   Resp 20   SpO2 96%   Physical Exam Vitals and nursing note reviewed.  Constitutional:      General: She is not in acute distress.    Appearance: She is well-developed.  HENT:     Head: Normocephalic and atraumatic.     Mouth/Throat:     Pharynx: No oropharyngeal exudate.     Comments: Minimal erythema of the posterior pharynx, clear nose, dry mucous membranes Eyes:     General: No scleral icterus.       Right eye: No discharge.        Left eye: No discharge.     Conjunctiva/sclera: Conjunctivae normal.     Pupils: Pupils are equal, round, and reactive to light.  Neck:     Thyroid: No thyromegaly.     Vascular: No JVD.  Cardiovascular:     Rate and Rhythm: Regular rhythm. Tachycardia present.     Heart sounds: Normal heart sounds. No murmur heard. No friction rub. No gallop.      Comments: Heart rate of 103, normal pulses at the radial arteries Pulmonary:     Effort: Pulmonary effort is normal. No respiratory distress.     Breath sounds: Normal breath sounds. No wheezing or rales.     Comments: Speaks in full sentences, lungs completely clear, no distress Abdominal:      General: There is no distension.     Palpations: Abdomen is soft. There is no mass.     Tenderness: There is no abdominal tenderness.     Comments: Increased bowel sounds, mild diffuse tenderness, no guarding or peritoneal signs, no focal right upper quadrant or right lower quadrant tenderness  Musculoskeletal:        General: No tenderness. Normal range of motion.  Cervical back: Normal range of motion and neck supple.     Right lower leg: No edema.     Left lower leg: No edema.  Lymphadenopathy:     Cervical: No cervical adenopathy.  Skin:    General: Skin is warm and dry.     Findings: No erythema or rash.  Neurological:     General: No focal deficit present.     Mental Status: She is alert.     Coordination: Coordination normal.     Comments: Normal facial symmetry, clear speech, moves all 4 extremities without any deficits  Psychiatric:        Behavior: Behavior normal.     ED Results / Procedures / Treatments   Labs (all labs ordered are listed, but only abnormal results are displayed) Labs Reviewed  CBC WITH DIFFERENTIAL/PLATELET - Abnormal; Notable for the following components:      Result Value   WBC 14.2 (*)    Hemoglobin 15.1 (*)    Neutro Abs 11.6 (*)    Monocytes Absolute 1.2 (*)    All other components within normal limits  COMPREHENSIVE METABOLIC PANEL - Abnormal; Notable for the following components:   Sodium 132 (*)    Potassium 2.8 (*)    Glucose, Bld 120 (*)    BUN 7 (*)    Total Protein 6.2 (*)    All other components within normal limits  URINALYSIS, ROUTINE W REFLEX MICROSCOPIC - Abnormal; Notable for the following components:   APPearance HAZY (*)    Specific Gravity, Urine 1.004 (*)    Ketones, ur 20 (*)    All other components within normal limits  RESP PANEL BY RT-PCR (FLU A&B, COVID) ARPGX2  URINE CULTURE  LIPASE, BLOOD  MAGNESIUM    EKG None  Radiology No results found.  Procedures Procedures   Medications Ordered in  ED Medications  magnesium sulfate IVPB 2 g 50 mL (2 g Intravenous New Bag/Given 06/07/20 1418)  sodium chloride 0.9 % bolus 1,000 mL (0 mLs Intravenous Stopped 06/07/20 1400)  ondansetron (ZOFRAN) injection 4 mg (4 mg Intravenous Given 06/07/20 1319)  potassium chloride 10 mEq in 100 mL IVPB (10 mEq Intravenous New Bag/Given 06/07/20 1414)  potassium chloride SA (KLOR-CON) CR tablet 40 mEq (40 mEq Oral Given 06/07/20 1418)    ED Course  I have reviewed the triage vital signs and the nursing notes.  Pertinent labs & imaging results that were available during my care of the patient were reviewed by me and considered in my medical decision making (see chart for details).    MDM Rules/Calculators/A&P                          Due to excessive nausea vomiting and generalized weakness the patient called for EMS transport, she is hypertensive, her tachycardia is improving, she will need Tylenol for fever, antiemetics, IV fluids and checking electrolytes, ruling out Covid.  She did receive the [employer] vaccine in May of last year.  Both she and her husband have had an upper respiratory viral type illness, this may be an extension of that or something completely different.  She does not appear toxic, she is agreeable to IV fluids meds and evaluation.  The patient feels much better, she is now tolerating oral liquids, she has a urinalysis that showed small ketones, she is negative for Covid or flu, she has a nonspecific leukocytosis  Her potassium was low, she was  given both magnesium and potassium IV and took oral potassium without difficulty.  I considered Zofran for home but since she has hypokalemia we will avoid potential QT prolongation ongoing and I have prescribed Phenergan, she is agreeable to return should symptoms worsen and will follow up with her family doctor.  Stable for discharge and the patient is agreeable to the plan.  Final Clinical Impression(s) / ED Diagnoses Final diagnoses:   Hypokalemia  Hypomagnesemia  Dehydration    Rx / DC Orders ED Discharge Orders         Ordered    potassium chloride (KLOR-CON) 10 MEQ tablet  Daily        06/07/20 1513    promethazine (PHENERGAN) 25 MG tablet  Every 6 hours PRN        06/07/20 1513    promethazine (PHENERGAN) 25 MG suppository  Every 6 hours PRN        06/07/20 1513           Noemi Chapel, MD 06/07/20 1515

## 2020-06-07 NOTE — Discharge Instructions (Addendum)
Your testing today showed that your potassium was low and your magnesium was replaced.  I would like to have you follow-up with your family doctor within the next several days for a recheck, make sure that you are taking potassium daily for the next 7 days, drink plenty of clear liquids  I would encourage you to use the promethazine tablets or suppositories once every 6 hours as needed for nausea, you may take Imodium as needed for diarrhea  Please return to the emergency department for severe or worsening symptoms

## 2020-06-07 NOTE — ED Triage Notes (Signed)
Pt arrived to ED via EMS from home. Pt with 3 days of nausea, vomiting and diarrhea. Report felt like she had a fever, but not taken. Last episode of diarrhea was yesterday and has vomiting 3 x today. Pt was given 4 mg of zofran and 500 ml of nss pre hospital

## 2020-06-08 LAB — URINE CULTURE

## 2020-06-19 ENCOUNTER — Other Ambulatory Visit: Payer: Self-pay | Admitting: Internal Medicine

## 2020-06-22 ENCOUNTER — Other Ambulatory Visit (HOSPITAL_COMMUNITY): Payer: Self-pay | Admitting: Registered Nurse

## 2020-06-22 ENCOUNTER — Other Ambulatory Visit: Payer: Self-pay | Admitting: Registered Nurse

## 2020-06-22 DIAGNOSIS — R112 Nausea with vomiting, unspecified: Secondary | ICD-10-CM

## 2020-06-22 DIAGNOSIS — R1013 Epigastric pain: Secondary | ICD-10-CM

## 2020-07-06 ENCOUNTER — Ambulatory Visit (HOSPITAL_COMMUNITY)
Admission: RE | Admit: 2020-07-06 | Discharge: 2020-07-06 | Disposition: A | Discharge status: Home / Self Care | Payer: Medicare Other | Source: Ambulatory Visit | Attending: Registered Nurse | Admitting: Registered Nurse

## 2020-07-06 ENCOUNTER — Other Ambulatory Visit: Payer: Self-pay

## 2020-07-06 DIAGNOSIS — R1013 Epigastric pain: Secondary | ICD-10-CM | POA: Diagnosis present

## 2020-07-06 DIAGNOSIS — R112 Nausea with vomiting, unspecified: Secondary | ICD-10-CM | POA: Diagnosis present

## 2020-07-06 MED ORDER — TECHNETIUM TC 99M MEBROFENIN IV KIT
5.3000 | PACK | Freq: Once | INTRAVENOUS | Status: AC | PRN
  Administered 2020-07-06: 5.3 via INTRAVENOUS

## 2020-09-30 ENCOUNTER — Encounter: Payer: Self-pay | Admitting: Gastroenterology

## 2020-10-06 ENCOUNTER — Ambulatory Visit (AMBULATORY_SURGERY_CENTER): Payer: Medicare Other | Admitting: *Deleted

## 2020-10-06 ENCOUNTER — Other Ambulatory Visit: Payer: Self-pay

## 2020-10-06 VITALS — Ht 65.25 in | Wt 137.0 lb

## 2020-10-06 DIAGNOSIS — Z86010 Personal history of colonic polyps: Secondary | ICD-10-CM

## 2020-10-06 NOTE — Progress Notes (Signed)
Pt's previsit is done over the phone and all paperwork (prep instructions, blank consent form to just read over) sent to patient.  Pt's name and DOB verified at the beginning of the previsit.  Pt denies any difficulty with ambulating.   Pt does have hx of PONV after back surgery, denies being told they were difficult to intubate, or hx/fam hx of malignant hyperthermia per pt   No egg or soy allergy  No home oxygen use   No medications for weight loss taken  emmi information given  Pt denies constipation issues  Pt informed that we do not do prior authorizations for prep

## 2020-10-18 ENCOUNTER — Telehealth: Payer: Self-pay | Admitting: Gastroenterology

## 2020-10-19 ENCOUNTER — Other Ambulatory Visit: Payer: Self-pay

## 2020-10-19 ENCOUNTER — Emergency Department (HOSPITAL_COMMUNITY): Payer: Medicare Other

## 2020-10-19 ENCOUNTER — Inpatient Hospital Stay (HOSPITAL_COMMUNITY)
Admission: EM | Admit: 2020-10-19 | Discharge: 2020-10-22 | DRG: 641 | Disposition: A | Discharge status: Home / Self Care | Payer: Medicare Other | Attending: Family Medicine | Admitting: Family Medicine

## 2020-10-19 ENCOUNTER — Encounter: Payer: Medicare Other | Admitting: Gastroenterology

## 2020-10-19 ENCOUNTER — Encounter (HOSPITAL_COMMUNITY): Payer: Self-pay | Admitting: Emergency Medicine

## 2020-10-19 ENCOUNTER — Telehealth: Payer: Self-pay | Admitting: *Deleted

## 2020-10-19 DIAGNOSIS — D72829 Elevated white blood cell count, unspecified: Secondary | ICD-10-CM | POA: Diagnosis present

## 2020-10-19 DIAGNOSIS — Z20822 Contact with and (suspected) exposure to covid-19: Secondary | ICD-10-CM | POA: Diagnosis present

## 2020-10-19 DIAGNOSIS — Z90710 Acquired absence of both cervix and uterus: Secondary | ICD-10-CM

## 2020-10-19 DIAGNOSIS — E876 Hypokalemia: Secondary | ICD-10-CM | POA: Diagnosis present

## 2020-10-19 DIAGNOSIS — Z79899 Other long term (current) drug therapy: Secondary | ICD-10-CM

## 2020-10-19 DIAGNOSIS — Z87891 Personal history of nicotine dependence: Secondary | ICD-10-CM

## 2020-10-19 DIAGNOSIS — E860 Dehydration: Principal | ICD-10-CM | POA: Diagnosis present

## 2020-10-19 DIAGNOSIS — F411 Generalized anxiety disorder: Secondary | ICD-10-CM | POA: Diagnosis present

## 2020-10-19 DIAGNOSIS — Z8719 Personal history of other diseases of the digestive system: Secondary | ICD-10-CM

## 2020-10-19 DIAGNOSIS — Z86010 Personal history of colonic polyps: Secondary | ICD-10-CM

## 2020-10-19 DIAGNOSIS — E7800 Pure hypercholesterolemia, unspecified: Secondary | ICD-10-CM | POA: Diagnosis not present

## 2020-10-19 DIAGNOSIS — Z8249 Family history of ischemic heart disease and other diseases of the circulatory system: Secondary | ICD-10-CM

## 2020-10-19 DIAGNOSIS — I10 Essential (primary) hypertension: Secondary | ICD-10-CM | POA: Diagnosis not present

## 2020-10-19 DIAGNOSIS — E785 Hyperlipidemia, unspecified: Secondary | ICD-10-CM | POA: Diagnosis present

## 2020-10-19 DIAGNOSIS — K5730 Diverticulosis of large intestine without perforation or abscess without bleeding: Secondary | ICD-10-CM | POA: Diagnosis present

## 2020-10-19 DIAGNOSIS — K2970 Gastritis, unspecified, without bleeding: Secondary | ICD-10-CM

## 2020-10-19 DIAGNOSIS — K209 Esophagitis, unspecified without bleeding: Secondary | ICD-10-CM

## 2020-10-19 DIAGNOSIS — Z79890 Hormone replacement therapy (postmenopausal): Secondary | ICD-10-CM

## 2020-10-19 DIAGNOSIS — R0602 Shortness of breath: Secondary | ICD-10-CM | POA: Diagnosis not present

## 2020-10-19 DIAGNOSIS — D124 Benign neoplasm of descending colon: Secondary | ICD-10-CM

## 2020-10-19 DIAGNOSIS — K640 First degree hemorrhoids: Secondary | ICD-10-CM | POA: Diagnosis present

## 2020-10-19 DIAGNOSIS — G8929 Other chronic pain: Secondary | ICD-10-CM | POA: Diagnosis present

## 2020-10-19 DIAGNOSIS — R197 Diarrhea, unspecified: Secondary | ICD-10-CM | POA: Diagnosis present

## 2020-10-19 DIAGNOSIS — E0590 Thyrotoxicosis, unspecified without thyrotoxic crisis or storm: Secondary | ICD-10-CM | POA: Diagnosis present

## 2020-10-19 DIAGNOSIS — R112 Nausea with vomiting, unspecified: Secondary | ICD-10-CM | POA: Diagnosis present

## 2020-10-19 DIAGNOSIS — K5900 Constipation, unspecified: Secondary | ICD-10-CM | POA: Diagnosis present

## 2020-10-19 DIAGNOSIS — K2100 Gastro-esophageal reflux disease with esophagitis, without bleeding: Secondary | ICD-10-CM

## 2020-10-19 DIAGNOSIS — Z888 Allergy status to other drugs, medicaments and biological substances status: Secondary | ICD-10-CM

## 2020-10-19 DIAGNOSIS — K621 Rectal polyp: Secondary | ICD-10-CM | POA: Diagnosis present

## 2020-10-19 LAB — CBC WITH DIFFERENTIAL/PLATELET
Abs Immature Granulocytes: 0.1 10*3/uL — ABNORMAL HIGH (ref 0.00–0.07)
Basophils Absolute: 0 10*3/uL (ref 0.0–0.1)
Basophils Relative: 0 %
Eosinophils Absolute: 0 10*3/uL (ref 0.0–0.5)
Eosinophils Relative: 0 %
HCT: 47.2 % — ABNORMAL HIGH (ref 36.0–46.0)
Hemoglobin: 16.5 g/dL — ABNORMAL HIGH (ref 12.0–15.0)
Immature Granulocytes: 1 %
Lymphocytes Relative: 16 %
Lymphs Abs: 2.2 10*3/uL (ref 0.7–4.0)
MCH: 31 pg (ref 26.0–34.0)
MCHC: 35 g/dL (ref 30.0–36.0)
MCV: 88.7 fL (ref 80.0–100.0)
Monocytes Absolute: 1.1 10*3/uL — ABNORMAL HIGH (ref 0.1–1.0)
Monocytes Relative: 8 %
Neutro Abs: 10.2 10*3/uL — ABNORMAL HIGH (ref 1.7–7.7)
Neutrophils Relative %: 75 %
Platelets: 409 10*3/uL — ABNORMAL HIGH (ref 150–400)
RBC: 5.32 MIL/uL — ABNORMAL HIGH (ref 3.87–5.11)
RDW: 13.5 % (ref 11.5–15.5)
WBC: 13.6 10*3/uL — ABNORMAL HIGH (ref 4.0–10.5)
nRBC: 0 % (ref 0.0–0.2)

## 2020-10-19 LAB — URINALYSIS, ROUTINE W REFLEX MICROSCOPIC
Bilirubin Urine: NEGATIVE
Glucose, UA: NEGATIVE mg/dL
Hgb urine dipstick: NEGATIVE
Ketones, ur: 20 mg/dL — AB
Leukocytes,Ua: NEGATIVE
Nitrite: NEGATIVE
Protein, ur: NEGATIVE mg/dL
Specific Gravity, Urine: 1.004 — ABNORMAL LOW (ref 1.005–1.030)
pH: 7 (ref 5.0–8.0)

## 2020-10-19 LAB — COMPREHENSIVE METABOLIC PANEL
ALT: 16 U/L (ref 0–44)
AST: 18 U/L (ref 15–41)
Albumin: 4.7 g/dL (ref 3.5–5.0)
Alkaline Phosphatase: 76 U/L (ref 38–126)
Anion gap: 16 — ABNORMAL HIGH (ref 5–15)
BUN: 13 mg/dL (ref 8–23)
CO2: 24 mmol/L (ref 22–32)
Calcium: 10.2 mg/dL (ref 8.9–10.3)
Chloride: 93 mmol/L — ABNORMAL LOW (ref 98–111)
Creatinine, Ser: 0.78 mg/dL (ref 0.44–1.00)
GFR, Estimated: >60 mL/min (ref >60)
Glucose, Bld: 136 mg/dL — ABNORMAL HIGH (ref 70–99)
Potassium: 2.8 mmol/L — ABNORMAL LOW (ref 3.5–5.1)
Sodium: 133 mmol/L — ABNORMAL LOW (ref 135–145)
Total Bilirubin: 1.1 mg/dL (ref 0.3–1.2)
Total Protein: 7.7 g/dL (ref 6.5–8.1)

## 2020-10-19 LAB — TROPONIN I (HIGH SENSITIVITY)
Troponin I (High Sensitivity): 5 ng/L (ref <18)
Troponin I (High Sensitivity): 7 ng/L (ref <18)

## 2020-10-19 LAB — I-STAT CHEM 8, ED
BUN: 11 mg/dL (ref 8–23)
Calcium, Ion: 1.18 mmol/L (ref 1.15–1.40)
Chloride: 93 mmol/L — ABNORMAL LOW (ref 98–111)
Creatinine, Ser: 0.7 mg/dL (ref 0.44–1.00)
Glucose, Bld: 140 mg/dL — ABNORMAL HIGH (ref 70–99)
HCT: 51 % — ABNORMAL HIGH (ref 36.0–46.0)
Hemoglobin: 17.3 g/dL — ABNORMAL HIGH (ref 12.0–15.0)
Potassium: 2.7 mmol/L — CL (ref 3.5–5.1)
Sodium: 130 mmol/L — ABNORMAL LOW (ref 135–145)
TCO2: 22 mmol/L (ref 22–32)

## 2020-10-19 LAB — PROTIME-INR
INR: 0.9 (ref 0.8–1.2)
Prothrombin Time: 12.6 seconds (ref 11.4–15.2)

## 2020-10-19 LAB — APTT: aPTT: 29 seconds (ref 24–36)

## 2020-10-19 LAB — RESP PANEL BY RT-PCR (FLU A&B, COVID) ARPGX2
Influenza A by PCR: NEGATIVE
Influenza B by PCR: NEGATIVE
SARS Coronavirus 2 by RT PCR: NEGATIVE

## 2020-10-19 LAB — MAGNESIUM: Magnesium: 1.8 mg/dL (ref 1.7–2.4)

## 2020-10-19 LAB — LIPASE, BLOOD: Lipase: 38 U/L (ref 11–51)

## 2020-10-19 LAB — LACTIC ACID, PLASMA: Lactic Acid, Venous: 1.6 mmol/L (ref 0.5–1.9)

## 2020-10-19 MED ORDER — ONDANSETRON HCL 4 MG/2ML IJ SOLN: 4.0000 mg | Freq: Four times a day (QID) | INTRAMUSCULAR | Status: DC | PRN

## 2020-10-19 MED ORDER — ACETAMINOPHEN 325 MG PO TABS
650.0000 mg | ORAL_TABLET | Freq: Once | ORAL | Status: AC
  Administered 2020-10-19: 650 mg via ORAL
  Filled 2020-10-19: qty 2

## 2020-10-19 MED ORDER — LACTATED RINGERS IV BOLUS (SEPSIS)
1000.0000 mL | Freq: Once | INTRAVENOUS | Status: AC
  Administered 2020-10-19: 1000 mL via INTRAVENOUS

## 2020-10-19 MED ORDER — POTASSIUM CHLORIDE 10 MEQ/100ML IV SOLN
10.0000 meq | INTRAVENOUS | Status: AC
  Administered 2020-10-19 (×4): 10 meq via INTRAVENOUS
  Filled 2020-10-19 (×4): qty 100

## 2020-10-19 MED ORDER — SODIUM CHLORIDE 0.9 % IV SOLN
12.5000 mg | Freq: Four times a day (QID) | INTRAVENOUS | Status: DC | PRN
  Administered 2020-10-19: 12.5 mg via INTRAVENOUS
  Filled 2020-10-19: qty 12.5

## 2020-10-19 MED ORDER — POTASSIUM CHLORIDE CRYS ER 20 MEQ PO TBCR
40.0000 meq | EXTENDED_RELEASE_TABLET | Freq: Once | ORAL | Status: AC
  Administered 2020-10-19: 40 meq via ORAL
  Filled 2020-10-19: qty 2

## 2020-10-19 MED ORDER — MAGNESIUM SULFATE IN D5W 1-5 GM/100ML-% IV SOLN
1.0000 g | Freq: Once | INTRAVENOUS | Status: AC
  Administered 2020-10-19: 1 g via INTRAVENOUS
  Filled 2020-10-19: qty 100

## 2020-10-19 MED ORDER — IOHEXOL 350 MG/ML SOLN
80.0000 mL | Freq: Once | INTRAVENOUS | Status: AC | PRN
  Administered 2020-10-19: 80 mL via INTRAVENOUS

## 2020-10-19 NOTE — ED Provider Notes (Signed)
Emergency Medicine Provider Triage Evaluation Note  Anna Moore , a 65 y.o. female  was evaluated in triage.  Pt complains of dehydration, tachycardia, nausea and vomiting.  Patient reports that last night she started taking prep for colonoscopy.  Patient reports that she had multiple episodes of vomiting.  Patient denies any hematemesis or coffee-ground emesis.  Patient reports that she has been having diarrhea since taking her colonoscopy prep.  Patient has not visualized stool to look for melena or frank red blood.  Patient endorses epigastric discomfort and shortness of breath.  Review of Systems  Positive: Nausea, vomiting, tachycardia Negative: Chest pain, leg swelling or tenderness,  Physical Exam  BP (!) 171/105   Pulse (!) 132   Temp 98.5 F (36.9 C) (Oral)   Resp (!) 25   SpO2 100%  Gen:   Awake, no distress   Resp:  Normal effort  MSK:   Moves extremities without difficulty  Other:  Abdomen soft, nondistended, mild tenderness to epigastric area.  No guarding or rebound tenderness.  Medical Decision Making  Medically screening exam initiated at 3:52 PM.  Appropriate orders placed.  Anna Moore was informed that the remainder of the evaluation will be completed by another provider, this initial triage assessment does not replace that evaluation, and the importance of remaining in the ED until their evaluation is complete.  The patient appears stable so that the remainder of the work up may be completed by another provider.      [name] 10/19/20 1554    Hayden Rasmussen, MD 10/19/20 [name]

## 2020-10-19 NOTE — Telephone Encounter (Signed)
Lets have her come in for NGI OV instead of just booking colonoscopy again. She's had a lot of vomiting recently.

## 2020-10-19 NOTE — ED Notes (Signed)
Patient back from CT.

## 2020-10-19 NOTE — ED Provider Notes (Signed)
[facility] DEPT Provider Note   CSN: [id number] Arrival date & time: 10/19/20  1514     History Chief Complaint  Patient presents with   Tachycardia   Dehydration   Shortness of Breath    Anna Moore is a 65 y.o. female with past medical history of hypertension, anxiety, hyperlipidemia, hypothyroid, who presents today for evaluation of tachycardia, dehydration. She states that she was trying to complete a colonoscopy prep.  She took them and then vomited the original prep.  For the repeat prep she took oral Zofran, waited an hour and then attempted to prep again and vomited that.  Chart review shows that she has been in contact with Dr. Rush Landmark of GI and his office.  Yesterday she reported shortness of breath.  Patient states that when she is actively vomiting she has shortness of breath, however when she is not vomiting she does not have shortness of breath. She denies any known fevers at home. She states that she has intermittently had nausea and vomiting since about 4 years ago.  She states that she has vomited at least a dozen times today and it is now simply bile.  She did have 1 large episode of diarrhea overnight which she attributes to taking the stool softeners.  She denies dysuria, [place] or urgency.  Her abdominal pain is mostly epigastric.  Does not radiate.    HPI     Past Medical History:  Diagnosis Date   Anxiety    DJD (degenerative joint disease)    Of the back   Hypertension    Thyroid disease    hyperthyroid    Patient Active Problem List   Diagnosis Date Noted   Nausea vomiting and diarrhea 10/19/2020   Dehydration 10/19/2020   Hypokalemia 10/19/2020   GAD (generalized anxiety disorder) 10/06/2019   Panic attacks 10/11/2016   Postmenopausal HRT (hormone replacement therapy) 07/28/2014   Hyperlipidemia 05/20/2014   Hyperthyroidism 09/22/2013   Hypertension    Anxiety    DJD (degenerative joint disease)      Past Surgical History:  Procedure Laterality Date   ABDOMINAL HYSTERECTOMY     BACK SURGERY  01/04/2008   On L4 and L5   COLONOSCOPY     NASAL SEPTUM SURGERY     OTHER SURGICAL HISTORY  03/05/1996   Hysterectomy   PAROTIDECTOMY  03/05/1988     OB History   No obstetric history on file.     Family History  Problem Relation Age of Onset   Atrial fibrillation Mother    Hypertension Father    Heart disease Father    Heart attack Father        52's   Cancer Father        non hodgkins lymphoma   Diabetes Father    Colon cancer Neg Hx    Thyroid disease Neg Hx    Esophageal cancer Neg Hx    Rectal cancer Neg Hx    Stomach cancer Neg Hx     Social History   Tobacco Use   Smoking status: Former    Types: Cigarettes    Quit date: 04/08/2016    Years since quitting: 4.5   Smokeless tobacco: Never  Vaping Use   Vaping Use: Never used  Substance Use Topics   Alcohol use: Yes    Alcohol/week: 6.0 standard drinks    Types: 6 Cans of beer per week    Comment: 5 beers a week   Drug use: No  Home Medications Prior to Admission medications   Medication Sig Start Date End Date Taking? Authorizing Provider  ALPRAZolam (XANAX) 0.25 MG tablet Take 1 tablet (0.25 mg total) by mouth daily as needed. for anxiety Patient taking differently: Take 0.25 mg by mouth daily as needed for anxiety. 04/11/20  Yes Jearld Fenton, NP  amLODipine (NORVASC) 10 MG tablet Take 1 tablet by mouth once daily 03/30/20  Yes Baity, Coralie Keens, NP  estradiol (ESTRACE) 1 MG tablet Take 1 tablet by mouth twice daily 04/13/20  Yes Baity, Coralie Keens, NP  gabapentin (NEURONTIN) 300 MG capsule Take 1 capsule by mouth at bedtime as needed (pain).   Yes [provider]  hydrochlorothiazide (HYDRODIURIL) 25 MG tablet Take 1 tablet by mouth once daily 05/02/20  Yes Baity, Coralie Keens, NP  promethazine (PHENERGAN) 25 MG tablet Take 1 tablet (25 mg total) by mouth every 6 (six) hours as needed for nausea or  vomiting. 06/07/20  Yes Noemi Chapel, MD  magnesium oxide (MAG-OX) 400 MG tablet Take 1 tablet (400 mg total) by mouth daily. 04/04/20   Charlesetta Shanks, MD  methimazole (TAPAZOLE) 5 MG tablet TAKE 1 TABLET BY MOUTH THREE TIMES A WEEK 03/30/20   Jearld Fenton, NP  potassium chloride (KLOR-CON) 10 MEQ tablet Take 1 tablet (10 mEq total) by mouth daily. 06/07/20   Noemi Chapel, MD  potassium chloride SA (KLOR-CON) 20 MEQ tablet Take 1 tablet (20 mEq total) by mouth 2 (two) times daily. 04/04/20   Charlesetta Shanks, MD  promethazine (PHENERGAN) 25 MG suppository Place 1 suppository (25 mg total) rectally every 6 (six) hours as needed for nausea or vomiting. 06/07/20   Noemi Chapel, MD  zolpidem (AMBIEN) 5 MG tablet TAKE 1 TABLET BY MOUTH AT BEDTIME AS NEEDED FOR SLEEP 04/08/19   Lucille Passy, MD    Allergies    Nitrofuran derivatives  Review of Systems   Review of Systems  Constitutional:  Positive for chills, diaphoresis and fatigue. Negative for fever.  HENT:  Negative for congestion.   Respiratory:  Positive for shortness of breath. Negative for cough and chest tightness.   Cardiovascular:  Negative for chest pain, palpitations and leg swelling.  Gastrointestinal:  Positive for abdominal pain, diarrhea, nausea and vomiting.  Genitourinary:  Negative for dysuria and frequency.  Musculoskeletal:  Negative for back pain and neck pain.  Skin:  Negative for color change and rash.  Neurological:  Positive for tremors and weakness. Negative for headaches.  Psychiatric/Behavioral:  Negative for confusion.   All other systems reviewed and are negative.  Physical Exam Updated Vital Signs BP (!) 144/76   Pulse 79   Temp 98.2 F (36.8 C) (Oral)   Resp 20   Ht 5' 5.25" (1.657 m)   Wt 61.2 kg   SpO2 96%   BMI 22.29 kg/m   Physical Exam Vitals and nursing note reviewed.  Constitutional:      Appearance: She is well-developed. She is ill-appearing.  HENT:     Head: Normocephalic and atraumatic.   Eyes:     Conjunctiva/sclera: Conjunctivae normal.  Cardiovascular:     Rate and Rhythm: Tachycardia present. Rhythm irregular.     Heart sounds: No murmur heard. Pulmonary:     Effort: Pulmonary effort is normal. No respiratory distress.     Breath sounds: Normal breath sounds. No decreased breath sounds or wheezing.  Chest:     Chest wall: No tenderness.  Abdominal:     General: Bowel sounds are  decreased. There is no distension.     Palpations: Abdomen is soft.     Tenderness: There is abdominal tenderness in the right upper quadrant, epigastric area and left upper quadrant. There is guarding. There is no rebound.  Musculoskeletal:     Cervical back: Normal range of motion and neck supple.     Right lower leg: No edema.     Left lower leg: No edema.     Comments: No obvious acute injury  Skin:    General: Skin is warm and dry.  Neurological:     Mental Status: She is alert.     Comments: Awake and alert, answers all questions appropriately.  Speech is not slurred.  Psychiatric:        Mood and Affect: Mood normal.        Behavior: Behavior normal.    ED Results / Procedures / Treatments   Labs (all labs ordered are listed, but only abnormal results are displayed) Labs Reviewed  COMPREHENSIVE METABOLIC PANEL - Abnormal; Notable for the following components:      Result Value   Sodium 133 (*)    Potassium 2.8 (*)    Chloride 93 (*)    Glucose, Bld 136 (*)    Anion gap 16 (*)    All other components within normal limits  CBC WITH DIFFERENTIAL/PLATELET - Abnormal; Notable for the following components:   WBC 13.6 (*)    RBC 5.32 (*)    Hemoglobin 16.5 (*)    HCT 47.2 (*)    Platelets 409 (*)    Neutro Abs 10.2 (*)    Monocytes Absolute 1.1 (*)    Abs Immature Granulocytes 0.10 (*)    All other components within normal limits  URINALYSIS, ROUTINE W REFLEX MICROSCOPIC - Abnormal; Notable for the following components:   Color, Urine STRAW (*)    Specific Gravity,  Urine 1.004 (*)    Ketones, ur 20 (*)    All other components within normal limits  I-STAT CHEM 8, ED - Abnormal; Notable for the following components:   Sodium 130 (*)    Potassium 2.7 (*)    Chloride 93 (*)    Glucose, Bld 140 (*)    Hemoglobin 17.3 (*)    HCT 51.0 (*)    All other components within normal limits  RESP PANEL BY RT-PCR (FLU A&B, COVID) ARPGX2  CULTURE, BLOOD (ROUTINE X 2)  CULTURE, BLOOD (ROUTINE X 2)  LIPASE, BLOOD  MAGNESIUM  LACTIC ACID, PLASMA  PROTIME-INR  APTT  LACTIC ACID, PLASMA  BASIC METABOLIC PANEL  TSH  T4, FREE  CK  PHOSPHORUS  T3  TROPONIN I (HIGH SENSITIVITY)  TROPONIN I (HIGH SENSITIVITY)    EKG EKG Interpretation  Date/Time:  Wednesday October 19 2020 15:41:39 EDT Ventricular Rate:  137 PR Interval:  146 QRS Duration: 83 QT Interval:  284 QTC Calculation: 429 R Axis:   -3 Text Interpretation: Sinus tachycardia LAE, consider biatrial enlargement RSR' in V1 or V2, right VCD or RVH Probable anteroseptal infarct, old 12 Lead; Mason-Likar No acute ischemia Confirmed by Lorre Munroe ([id number]) on 10/19/2020 5:35:57 PM  Radiology CT Abdomen Pelvis W Contrast  Result Date: 10/19/2020 CLINICAL DATA:  Nausea and vomiting. Vomiting while taking prep for colonoscopy. EXAM: CT ABDOMEN AND PELVIS WITH CONTRAST TECHNIQUE: Multidetector CT imaging of the abdomen and pelvis was performed using the standard protocol following bolus administration of intravenous contrast. CONTRAST:  47m OMNIPAQUE IOHEXOL 350 MG/ML SOLN COMPARISON:  CT 10/01/2019  FINDINGS: Lower chest: Minor bandlike scarring in the left lower lobe. No acute airspace disease. No pleural effusion. Hepatobiliary: Scattered cysts throughout the liver. No suspicious liver lesion. Left hepatic lobe again wraps into the left upper quadrant, variant anatomy. Physiologically distended gallbladder. 5 mm hyperdensity in the non dependent gallbladder fundus likely represents a polyp versus less likely  adherent stone. No pericholecystic fat stranding. No biliary dilatation. Pancreas: No ductal dilatation or inflammation. Spleen: Lobulated splenic contours.  Normal in size. Adrenals/Urinary Tract: Normal adrenal glands. No hydronephrosis or perinephric edema. Homogeneous renal enhancement with symmetric excretion on delayed phase imaging. Unchanged small cyst in the anterior lower right kidney. No suspicious renal lesion. No visualized renal stone. Urinary bladder is physiologically distended without wall thickening. Stomach/Bowel: Stomach is partially distended. No abnormal gastric distension. There is no small bowel inflammation or abnormal distention/obstruction. Normal appendix. Small volume of colonic stool. No colonic wall thickening or inflammation. Vascular/Lymphatic: Normal caliber abdominal aorta. Mild atherosclerosis. Patent portal vein. Circumaortic left renal vein. No acute vascular findings. No enlarged lymph nodes in the abdomen or pelvis. Reproductive: Hysterectomy.  Stable appearance of the adnexa. Other: No free air, free fluid, or intra-abdominal fluid collection. Musculoskeletal: Anterior fusion hardware at L4-L5. Near complete disc space loss at L3-L4. IMPRESSION: 1. No acute abnormality in the abdomen/pelvis. No evidence of bowel obstruction or inflammation. 2. A 5 mm hyperdensity in the gallbladder fundus likely represents a polyp versus less likely adherent stone. No pericholecystic fat stranding. Aortic Atherosclerosis (ICD10-I70.0). Electronically Signed   By: Keith Rake M.D.   On: 10/19/2020 19:07   DG Chest Port 1 View  Result Date: 10/19/2020 CLINICAL DATA:  Questionable sepsis - evaluate for abnormality Shortness of breath. EXAM: PORTABLE CHEST 1 VIEW COMPARISON:  04/04/2020 FINDINGS: Similar hyperinflation of prior exam. Normal heart size and mediastinal contours. No focal airspace disease. No pleural effusion or pneumothorax. No pulmonary edema. No acute osseous  abnormalities are seen. IMPRESSION: Chronic hyperinflation without acute abnormality. Electronically Signed   By: Keith Rake M.D.   On: 10/19/2020 17:46    Procedures Procedures   Medications Ordered in ED Medications  promethazine (PHENERGAN) 12.5 mg in sodium chloride 0.9 % 50 mL IVPB (0 mg Intravenous Stopped 10/19/20 1959)  magnesium sulfate IVPB 1 g 100 mL (1 g Intravenous New Bag/Given 10/19/20 2328)  lactated ringers bolus 1,000 mL (0 mLs Intravenous Stopped 10/19/20 1844)  potassium chloride 10 mEq in 100 mL IVPB (0 mEq Intravenous Stopped 10/19/20 2159)  iohexol (OMNIPAQUE) 350 MG/ML injection 80 mL (80 mLs Intravenous Contrast Given 10/19/20 1848)  acetaminophen (TYLENOL) tablet 650 mg (650 mg Oral Given 10/19/20 2159)  potassium chloride SA (KLOR-CON) CR tablet 40 mEq (40 mEq Oral Given 10/19/20 2159)    ED Course  I have reviewed the triage vital signs and the nursing notes.  Pertinent labs & imaging results that were available during my care of the patient were reviewed by me and considered in my medical decision making (see chart for details).  Clinical Course as of 10/19/20 2358  Wed Oct 19, 2020  2041 Patient is reevaluated.  She is still tremulous however is able to ambulate to the bathroom.  She reoirts headache,  Wuill attempt to trial on crackers and ckears and if she tolerates will attempt PO k and tylenol.   [EH]  2117 Patient is reevaluated.  She is beginning to p.o. challenge on crackers and clears. [EH]    Clinical Course User Index [EH] Lorin Glass, PA-C  MDM Rules/Calculators/A&P                          Erla Rolfe is a 65 year old woman who presents today for evaluation of dehydration.  She has had one episode of diarrhea and multiple episodes of vomiting.  Here she is tachycardic.  She is hypokalemic with a potassium of 2.8.  Magnesium is normal.  She is given IV and p.o. replacement.  She does have a slight white count at 13.6 and is  intermittently tachypneic here.  CT scan of her abdomen and pelvis without acute abnormality or cause for her symptoms found.  She does not have an obstruction.  Lipase is not elevated.  COVID and flu testing is negative.  He was treated in the emergency room with antiemetics, however still continued to have nausea and to be tremulous.    While she did meet SIRS criteria, she did not have a clear infectious source of her symptoms therefore sepsis is not called.  Given her ongoing tremulousness, and overall dehydration she will require admission for ongoing fluid and electrolyte replacement and monitoring.  I spoke with Dr. Roel Cluck who will see patient for admission.  The patient appears reasonably stabilized for admission considering the current resources, flow, and capabilities available in the ED at this time, and I doubt any other [facility] requiring further screening and/or treatment in the ED prior to admission assuming timely admission and bed placement.  Note: Portions of this report may have been transcribed using voice recognition software. Every effort was made to ensure accuracy; however, inadvertent computerized transcription errors may be present   Final Clinical Impression(s) / ED Diagnoses Final diagnoses:  Hypokalemia  Dehydration    Rx / DC Orders ED Discharge Orders     None        Ollen Gross 10/20/20 0005    Arnaldo Natal, MD 10/20/20 [phone]

## 2020-10-19 NOTE — Telephone Encounter (Signed)
Pt's husband called back this morning. The patient has been unable to complete the prep despite using phenergan and zofran. Will cancel patient today and told the husband the office will be in contact with them to follow up.

## 2020-10-19 NOTE — ED Triage Notes (Signed)
Pt arrives POV with complaints of dehydration, tachycardia and shortness of breath that began last night after trying to prep for a colonoscopy. Pt states she drank prep and was unable to keep it down due to N/V

## 2020-10-19 NOTE — H&P (Signed)
Anna Moore [id number] DOB: 02/02/1956 DOA: 10/19/2020     PCP: Haywood Pao, MD   Outpatient Specialists:     GI Dr. Rush Landmark  ( [facility])    Patient arrived to ER on 10/19/20 at 1514 Referred by Attending Arnaldo Natal, MD   Patient coming from: home      Chief Complaint:   Chief Complaint  Patient presents with   Tachycardia   Dehydration   Shortness of Breath    HPI: Anna Moore is a 65 y.o. female with medical history significant of hyperthyroidism, parotid mass sp resection, HTN    Presented with  dehydration, tachycardia, nausea and vomiting.   last night she started taking prep for colonoscopy followed by severe vomiting and Diarrhea associated with endorses epigastric discomfort and shortness of breath This is her second time undergoing bowel prep because prior to that she has not been able to tolerate p.o.'s. Patient had last colonoscopy in 2014 that showed some polyps Patient reports she has been having intermittent episodes of heart racing hot flashes chills and tremors.  She may end up going to the hospital for similar episode before. She has been compliant with her methimazole. She does take hormone replacement therapy   Pt endorses chronic back pain for which she used marijuana  Has   been vaccinated against COVID  and boosted   Initial COVID TEST  NEGATIVE   Lab Results  Component Value Date   [place] 10/19/2020   [place] NEGATIVE 06/07/2020   [place] NEGATIVE 04/04/2020   Regarding pertinent Chronic problems:      HTN on Norvasc and hydrochlorothiazide    Hyperthyroidism:  Lab Results  Component Value Date   TSH 0.60 04/11/2020   on Tapazole 3 times a week   While in ER: Noted to be initially tachycardic and required IV fluids appears to be significantly dehydrated vitals improved with IV fluid administration. CT abdomen unremarkable    ED Triage Vitals  Enc Vitals Group     BP 10/19/20 1542  (!) 171/105     Pulse Rate 10/19/20 1542 (!) 132     Resp 10/19/20 1542 (!) 25     Temp 10/19/20 1542 98.5 F (36.9 C)     Temp Source 10/19/20 1542 Oral     SpO2 10/19/20 1542 100 %     Weight 10/19/20 1616 135 lb (61.2 kg)     Height 10/19/20 1616 5' 5.25" (1.657 m)     Head Circumference --      Peak Flow --      Pain Score --      Pain Loc --      Pain Edu? --      Excl. in [place]? --   TMAX(24)@     _________________________________________ Significant initial  Findings: Abnormal Labs Reviewed  COMPREHENSIVE METABOLIC PANEL - Abnormal; Notable for the following components:      Result Value   Sodium 133 (*)    Potassium 2.8 (*)    Chloride 93 (*)    Glucose, Bld 136 (*)    Anion gap 16 (*)    All other components within normal limits  CBC WITH DIFFERENTIAL/PLATELET - Abnormal; Notable for the following components:   WBC 13.6 (*)    RBC 5.32 (*)    Hemoglobin 16.5 (*)    HCT 47.2 (*)    Platelets 409 (*)    Neutro Abs 10.2 (*)    Monocytes Absolute 1.1 (*)  Abs Immature Granulocytes 0.10 (*)    All other components within normal limits  URINALYSIS, ROUTINE W REFLEX MICROSCOPIC - Abnormal; Notable for the following components:   Color, Urine STRAW (*)    Specific Gravity, Urine 1.004 (*)    Ketones, ur 20 (*)    All other components within normal limits  I-STAT CHEM 8, ED - Abnormal; Notable for the following components:   Sodium 130 (*)    Potassium 2.7 (*)    Chloride 93 (*)    Glucose, Bld 140 (*)    Hemoglobin 17.3 (*)    HCT 51.0 (*)    All other components within normal limits   ____________________________________________ Ordered    CTabd/pelvis -  non-acute   _________________________ Troponin 7-5 ECG: Ordered Personally reviewed by me showing: HR : 137 Rhythm:   Sinus tachycardia    nonspecific changes,   QTC 429 ____________  The recent clinical data is shown below. Vitals:   10/19/20 2030 10/19/20 2110 10/19/20 2130 10/19/20 2220  BP:  131/70 (!) 149/84 139/83 (!) 144/76  Pulse: 84 100 88 79  Resp: (!) 23 20 (!) 25 20  Temp:      TempSrc:      SpO2: 95% 96% 96% 96%  Weight:      Height:        WBC     Component Value Date/Time   WBC 13.6 (H) 10/19/2020 1553   LYMPHSABS 2.2 10/19/2020 1553   MONOABS 1.1 (H) 10/19/2020 1553   EOSABS 0.0 10/19/2020 1553   BASOSABS 0.0 10/19/2020 1553    Lactic Acid, Venous    Component Value Date/Time   LATICACIDVEN 1.6 10/19/2020 1615     UA   no evidence of UTI     Urine analysis:    Component Value Date/Time   COLORURINE STRAW (A) 10/19/2020 1712   APPEARANCEUR CLEAR 10/19/2020 1712   LABSPEC 1.004 (L) 10/19/2020 1712   PHURINE 7.0 10/19/2020 1712   GLUCOSEU NEGATIVE 10/19/2020 1712   GLUCOSEU NEGATIVE 02/13/2019 1018   HGBUR NEGATIVE 10/19/2020 1712   BILIRUBINUR NEGATIVE 10/19/2020 1712   BILIRUBINUR Negative 10/01/2019 1056   KETONESUR 20 (A) 10/19/2020 1712   PROTEINUR NEGATIVE 10/19/2020 1712   UROBILINOGEN 0.2 10/01/2019 1056   UROBILINOGEN 0.2 02/13/2019 1018   NITRITE NEGATIVE 10/19/2020 1712   LEUKOCYTESUR NEGATIVE 10/19/2020 1712    Results for orders placed or performed during the hospital encounter of 10/19/20  Resp Panel by RT-PCR (Flu A&B, Covid) Nasopharyngeal Swab     Status: None   Collection Time: 10/19/20  4:16 PM   Specimen: Nasopharyngeal Swab; Nasopharyngeal(NP) swabs in vial transport medium  Result Value Ref Range Status   SARS Coronavirus 2 by RT PCR NEGATIVE NEGATIVE Final         Influenza A by PCR NEGATIVE NEGATIVE Final   Influenza B by PCR NEGATIVE NEGATIVE Final           _______________________________________________ Hospitalist was called for admission for hypokalemia and dehydration in the setting of undergoing bowel prep for colonoscopy  The following Work up has been ordered so far:  Orders Placed This Encounter  Procedures   Resp Panel by RT-PCR (Flu A&B, Covid) Nasopharyngeal Swab   Blood culture (routine x 2)    DG Chest Port 1 View   CT Abdomen Pelvis W Contrast   Comprehensive metabolic panel   Lipase, blood   CBC with Differential   Magnesium   Lactic acid, plasma   Protime-INR  APTT   Urinalysis, Routine w reflex microscopic   Diet NPO time specified   Cardiac monitoring   Document height and weight   Assess and Document Glasgow Coma Scale   Document vital signs within 1-hour of fluid bolus completion.  Notify provider of abnormal vital signs despite fluid resuscitation.   Refer to Sidebar Report: Sepsis Bundle ED/IP   Notify provider for difficulties obtaining IV access   Initiate [place]rier Fluid Protocol   Orthostatic vital signs   Consult to hospitalist   Pulse oximetry, continuous   I-Stat Chem 8, ED   EKG 12-Lead   Insert peripheral IV X 1     Following Medications were ordered in ER: Medications  promethazine (PHENERGAN) 12.5 mg in sodium chloride 0.9 % 50 mL IVPB (0 mg Intravenous Stopped 10/19/20 1959)  lactated ringers bolus 1,000 mL (0 mLs Intravenous Stopped 10/19/20 1844)  potassium chloride 10 mEq in 100 mL IVPB (0 mEq Intravenous Stopped 10/19/20 2159)  iohexol (OMNIPAQUE) 350 MG/ML injection 80 mL (80 mLs Intravenous Contrast Given 10/19/20 1848)  acetaminophen (TYLENOL) tablet 650 mg (650 mg Oral Given 10/19/20 2159)  potassium chloride SA (KLOR-CON) CR tablet 40 mEq (40 mEq Oral Given 10/19/20 2159)        Consult Orders  (From admission, onward)           Start     Ordered   10/19/20 2218  Consult to hospitalist  Once       Provider:  (Not yet assigned)  Question Answer Comment  Place call to: [facility]   Reason for Consult Admit      10/19/20 2217              OTHER Significant initial  Findings:  labs showing:    Recent Labs  Lab 10/19/20 1553 10/19/20 1642  NA 133* 130*  K 2.8* 2.7*  CO2 24  --   GLUCOSE 136* 140*  BUN 13 11  CREATININE 0.78 0.70  CALCIUM 10.2  --   MG 1.8  --     Cr   stable,   Lab Results   Component Value Date   CREATININE 0.70 10/19/2020   CREATININE 0.78 10/19/2020   CREATININE 0.70 06/07/2020    Recent Labs  Lab 10/19/20 1553  AST 18  ALT 16  ALKPHOS 76  BILITOT 1.1  PROT 7.7  ALBUMIN 4.7   Lab Results  Component Value Date   CALCIUM 10.2 10/19/2020    Plt: Lab Results  Component Value Date   PLT 409 (H) 10/19/2020      Recent Labs  Lab 10/19/20 1553 10/19/20 1642  WBC 13.6*  --   NEUTROABS 10.2*  --   HGB 16.5* 17.3*  HCT 47.2* 51.0*  MCV 88.7  --   PLT 409*  --     HG/HCT  Up from baseline see below    Component Value Date/Time   HGB 17.3 (H) 10/19/2020 1642   HCT 51.0 (H) 10/19/2020 1642   MCV 88.7 10/19/2020 1553      Recent Labs  Lab 10/19/20 1553  LIPASE 38    Cardiac Panel (last 3 results) Recent Labs    10/19/20 2330  CKTOTAL 45     BNP (last 3 results) Recent Labs    04/04/20 1000  BNP 14.2    Cultures:    Component Value Date/Time   SDES URINE, RANDOM 06/07/2020 1237   SPECREQUEST  06/07/2020 1237    NONE Performed at [facility], [place] [address], [place], [place] [address]    CULT MULTIPLE SPECIES PRESENT, SUGGEST RECOLLECTION (A) 06/07/2020 1237   REPTSTATUS 06/08/2020 FINAL 06/07/2020 1237     Radiological Exams on Admission: CT Abdomen Pelvis W Contrast  Result Date: 10/19/2020 CLINICAL DATA:  Nausea and vomiting. Vomiting while taking prep for colonoscopy. EXAM: CT ABDOMEN AND PELVIS WITH CONTRAST TECHNIQUE: Multidetector CT imaging of the abdomen and pelvis was performed using the standard protocol following bolus administration of intravenous contrast. CONTRAST:  62m OMNIPAQUE IOHEXOL 350 MG/ML SOLN COMPARISON:  CT 10/01/2019 FINDINGS: Lower chest: Minor bandlike scarring in the left lower lobe. No acute airspace disease. No pleural effusion. Hepatobiliary: Scattered cysts throughout the liver. No suspicious liver lesion. Left hepatic lobe again wraps into the left upper quadrant, variant  anatomy. Physiologically distended gallbladder. 5 mm hyperdensity in the non dependent gallbladder fundus likely represents a polyp versus less likely adherent stone. No pericholecystic fat stranding. No biliary dilatation. Pancreas: No ductal dilatation or inflammation. Spleen: Lobulated splenic contours.  Normal in size. Adrenals/Urinary Tract: Normal adrenal glands. No hydronephrosis or perinephric edema. Homogeneous renal enhancement with symmetric excretion on delayed phase imaging. Unchanged small cyst in the anterior lower right kidney. No suspicious renal lesion. No visualized renal stone. Urinary bladder is physiologically distended without wall thickening. Stomach/Bowel: Stomach is partially distended. No abnormal gastric distension. There is no small bowel inflammation or abnormal distention/obstruction. Normal appendix. Small volume of colonic stool. No colonic wall thickening or inflammation. Vascular/Lymphatic: Normal caliber abdominal aorta. Mild atherosclerosis. Patent portal vein. Circumaortic left renal vein. No acute vascular findings. No enlarged lymph nodes in the abdomen or pelvis. Reproductive: Hysterectomy.  Stable appearance of the adnexa. Other: No free air, free fluid, or intra-abdominal fluid collection. Musculoskeletal: Anterior fusion hardware at L4-L5. Near complete disc space loss at L3-L4. IMPRESSION: 1. No acute abnormality in the abdomen/pelvis. No evidence of bowel obstruction or inflammation. 2. A 5 mm hyperdensity in the gallbladder fundus likely represents a polyp versus less likely adherent stone. No pericholecystic fat stranding. Aortic Atherosclerosis (ICD10-I70.0). Electronically Signed   By: MKeith RakeM.D.   On: 10/19/2020 19:07   DG Chest Port 1 View  Result Date: 10/19/2020 CLINICAL DATA:  Questionable sepsis - evaluate for abnormality Shortness of breath. EXAM: PORTABLE CHEST 1 VIEW COMPARISON:  04/04/2020 FINDINGS: Similar hyperinflation of prior exam.  Normal heart size and mediastinal contours. No focal airspace disease. No pleural effusion or pneumothorax. No pulmonary edema. No acute osseous abnormalities are seen. IMPRESSION: Chronic hyperinflation without acute abnormality. Electronically Signed   By: MKeith RakeM.D.   On: 10/19/2020 17:46   _______________________________________________________________________________________________________ Latest  Blood pressure (!) 144/76, pulse 79, temperature 98.2 F (36.8 C), temperature source Oral, resp. rate 20, height 5' 5.25" (1.657 m), weight 61.2 kg, SpO2 96 %.   Review of Systems:    Pertinent positives include:  chills, fatigue, abdominal pain, nausea, vomiting, diarrhea, Constitutional:  No weight loss, night sweats, Fevers,  weight loss  HEENT:  No headaches, Difficulty swallowing,Tooth/dental problems,Sore throat,  No sneezing, itching, ear ache, nasal congestion, post nasal drip,  Cardio-vascular:  No chest pain, Orthopnea, PND, anasarca, dizziness, palpitations.no Bilateral lower extremity swelling  GI:  No heartburn, indigestion,  change in bowel habits, loss of appetite, melena, blood in stool, hematemesis Resp:  no shortness of breath at rest. No dyspnea on exertion, No excess mucus, no productive cough, No non-productive cough, No coughing up of blood.No change in color of mucus.No wheezing. Skin:  no rash or lesions. No jaundice GU:  no dysuria, change in color of urine, no urgency or frequency. No straining to urinate.  No flank pain.  Musculoskeletal:  No joint pain or no joint swelling. No decreased range of motion. No back pain.  Psych:  No change in mood or affect. No depression or anxiety. No memory loss.  Neuro: no localizing neurological complaints, no tingling, no weakness, no double vision, no gait abnormality, no slurred speech, no confusion  All systems reviewed and apart from [place] all are  negative _______________________________________________________________________________________________ Past Medical History:   Past Medical History:  Diagnosis Date   Anxiety    DJD (degenerative joint disease)    Of the back   Hypertension    Thyroid disease    hyperthyroid      Past Surgical History:  Procedure Laterality Date   ABDOMINAL HYSTERECTOMY     BACK SURGERY  01/04/2008   On L4 and L5   COLONOSCOPY     NASAL SEPTUM SURGERY     OTHER SURGICAL HISTORY  03/05/1996   Hysterectomy   PAROTIDECTOMY  03/05/1988    Social History:  Ambulatory   independently       reports that she quit smoking about 4 years ago. Her smoking use included cigarettes. She has never used smokeless tobacco. She reports current alcohol use of about 6.0 standard drinks per week. She reports that she does not use drugs.     Family History:   Family History  Problem Relation Age of Onset   Atrial fibrillation Mother    Hypertension Father    Heart disease Father    Heart attack Father        61's   Cancer Father        non hodgkins lymphoma   Diabetes Father    Colon cancer Neg Hx    Thyroid disease Neg Hx    Esophageal cancer Neg Hx    Rectal cancer Neg Hx    Stomach cancer Neg Hx    ______________________________________________________________________________________________ Allergies: Allergies  Allergen Reactions   Nitrofuran Derivatives     Became very sick     Prior to Admission medications   Medication Sig Start Date End Date Taking? Authorizing Provider  ALPRAZolam (XANAX) 0.25 MG tablet Take 1 tablet (0.25 mg total) by mouth daily as needed. for anxiety Patient taking differently: Take 0.25 mg by mouth daily as needed for anxiety. 04/11/20  Yes Jearld Fenton, NP  amLODipine (NORVASC) 10 MG tablet Take 1 tablet by mouth once daily 03/30/20  Yes Baity, Coralie Keens, NP  estradiol (ESTRACE) 1 MG tablet Take 1 tablet by mouth twice daily 04/13/20  Yes Baity, Coralie Keens, NP   gabapentin (NEURONTIN) 300 MG capsule Take 1 capsule by mouth at bedtime as needed (pain).   Yes [provider]  hydrochlorothiazide (HYDRODIURIL) 25 MG tablet Take 1 tablet by mouth once daily 05/02/20  Yes Baity, Coralie Keens, NP  promethazine (PHENERGAN) 25 MG tablet Take 1 tablet (25 mg total) by mouth every 6 (six) hours as needed for nausea or vomiting. 06/07/20  Yes Noemi Chapel, MD  magnesium oxide (MAG-OX) 400 MG tablet Take 1 tablet (400 mg total) by mouth daily. 04/04/20   Charlesetta Shanks, MD  methimazole (TAPAZOLE) 5 MG tablet TAKE 1 TABLET BY MOUTH THREE TIMES A WEEK 03/30/20   Jearld Fenton, NP  potassium chloride (KLOR-CON) 10 MEQ tablet Take 1 tablet (10 mEq total) by mouth daily. 06/07/20  Noemi Chapel, MD  potassium chloride SA (KLOR-CON) 20 MEQ tablet Take 1 tablet (20 mEq total) by mouth 2 (two) times daily. 04/04/20   Charlesetta Shanks, MD  promethazine (PHENERGAN) 25 MG suppository Place 1 suppository (25 mg total) rectally every 6 (six) hours as needed for nausea or vomiting. 06/07/20   Noemi Chapel, MD  zolpidem (AMBIEN) 5 MG tablet TAKE 1 TABLET BY MOUTH AT BEDTIME AS NEEDED FOR SLEEP 04/08/19   Lucille Passy, MD    ___________________________________________________________________________________________________ Physical Exam: Vitals with BMI 10/19/2020 10/19/2020 10/19/2020  Height - - -  Weight - - -  BMI - - -  Systolic [id number]  Diastolic 76 83 84  Pulse 79 88 100     1. General:  in No  Acute distress   well -appearing 2. Psychological: Alert and  Oriented 3. Head/ENT:   Dry Mucous Membranes                          Head Non traumatic, neck supple                           Poor Dentition 4. SKIN:  decreased Skin turgor,  Skin clean Dry and intact no rash 5. Heart: Regular rate and rhythm no  Murmur, no Rub or gallop 6. Lungs: no wheezes or crackles   7. Abdomen: Soft,  non-tender, Non distended   8. Lower extremities: no clubbing, cyanosis, no   edema 9. Neurologically Grossly intact, moving all 4 extremities equally   10. MSK: Normal range of motion    Chart has been reviewed  ______________________________________________________________________________________________  Assessment/Plan 65 y.o. female with medical history significant of hyperthyroidism, parotid mass sp resection, HTN   Admitted for dehydration and hypokalemia  Present on Admission:  Nausea vomiting and diarrhea -in the setting of colon prep for colonoscopy.  Sent message to GI to see if patient should follow-up with them as an outpatient versus finish the prep as an inpatient   Hyperthyroidism  -TSH within normal limits await results of T4 and T3.  Continue Tapazole 3 times a week if she is still here   Hypertension -restart Norvasc hold hydrochlorothiazide    Dehydration -rehydrate and follow fluid status   Hypokalemia -replaced  Mild hypomagnesemia will replace   hypophosphatemia replace  Other plan as per orders.  DVT prophylaxis:  SCD      Code Status:    Code Status: Not on file FULL CODE   as per patient   I had personally discussed CODE STATUS with patient      Family Communication:   Family not at  Bedside    Disposition Plan:    To home once workup is complete and patient is stable   Following barriers for discharge:                            Electrolytes corrected                                  Will need to be able to tolerate PO  Will need consultants to evaluate patient prior to discharge   Consults called:  sent msg to GI  Admission status:  ED Disposition     ED Disposition  Admit   Condition  --   [place]: [facility] [[id number]]  Level of Care: Telemetry [5]  Admit to tele based on following criteria: Other see comments  Comments: hypokalemia  May place patient in observation at [facility] or [place] if equivalent level of  care is available:: No  Covid Evaluation: Confirmed COVID Negative  Diagnosis: Dehydration [276.51.ICD-9-CM]  Admitting Physician: Toy Baker [[id number]]  Attending Physician: Toy Baker [[id number]]           Obs     Level of care     tele  For 12H      Lab Results  Component Value Date   [place] NEGATIVE 10/19/2020     Precautions: admitted as   Covid Negative     PPE: Used by the provider:   N95  eye Goggles,  Gloves     Jaida Basurto 10/19/2020, 1:18 AM    [facility]     after 2 AM please page floor coverage PA If 7AM-7PM, please contact the day team taking care of the patient using [email or web address]   Patient was evaluated in the context of the global COVID-19 pandemic, which necessitated consideration that the patient might be at risk for infection with the SARS-CoV-2 virus that causes COVID-19. Institutional protocols and algorithms that pertain to the evaluation of patients at risk for COVID-19 are in a state of rapid change based on information released by regulatory bodies including the CDC and federal and state organizations. These policies and algorithms were followed during the patient's care.

## 2020-10-19 NOTE — Telephone Encounter (Signed)
Called by patient on 8/16 p.m. evening. Began her preparation this afternoon but unfortunately vomited all of the 32 ounces of MiraLAX/Gatorade. She describes a long-term issue with a difficult stomach for which she has used antinausea medications in the past. She has been seen in the emergency department previously in May with addition of Phenergan to Zofran that had already been given to her previously. Patient did not take any of her antiemetics before initiation of the MiraLAX/Gatorade combination. She is worried about tomorrow's procedure. Recommended that she get another bottle of MiraLAX/Gatorade and mix that up. If she wants to try again this evening to take the preparation she should make sure that it is cold/chilled. She will begin by taking either Phenergan or Zofran (she has both at home) and take that 1 hour before reinitiation of preparation.  Drink preparation over 3 hours instead of quickly. If she tolerates this then tomorrow morning she can initiate her bowel preparation instead of at 6 AM at 4 AM and once again take Zofran before initiation of the preparation. If she has progressive nausea/vomiting she may be unable to prep air at which point she would likely need to have a clinic visit to discuss and go over her symptoms and a different bowel regimen. I will forward this to Dr. Ardis Hughs in the [facility] team. She was appreciative for the call back and instructions.   Justice Britain, MD [facility] # [phone]

## 2020-10-19 NOTE — Telephone Encounter (Signed)
Pt's husband called this morning stating that the patient was unable to complete the prep. She has continued to have nausea and vomiting despite using the zofran and phenergan.  Will let Dr. Ardis Hughs know. Pt is canceled for today.

## 2020-10-19 NOTE — ED Notes (Signed)
Notify Sarina Ser about the [employer]

## 2020-10-19 NOTE — ED Notes (Signed)
Patient given ice chips per [place], [place].

## 2020-10-19 NOTE — ED Notes (Signed)
Patient transported to CT 

## 2020-10-19 NOTE — Telephone Encounter (Signed)
The pt has been scheduled for follow up appt on 8/23 to discuss colon.  The pt has been advised.  She states she is very sick and her heart is racing and she is SOB.  I did advise that she go to the ED for evaluation.  FYI Dr Ardis Hughs

## 2020-10-20 ENCOUNTER — Observation Stay (HOSPITAL_COMMUNITY): Payer: Medicare Other

## 2020-10-20 DIAGNOSIS — R197 Diarrhea, unspecified: Secondary | ICD-10-CM | POA: Diagnosis not present

## 2020-10-20 DIAGNOSIS — R112 Nausea with vomiting, unspecified: Secondary | ICD-10-CM

## 2020-10-20 DIAGNOSIS — E860 Dehydration: Secondary | ICD-10-CM | POA: Diagnosis not present

## 2020-10-20 LAB — CBC WITH DIFFERENTIAL/PLATELET
Abs Immature Granulocytes: 0.05 10*3/uL (ref 0.00–0.07)
Basophils Absolute: 0 10*3/uL (ref 0.0–0.1)
Basophils Relative: 0 %
Eosinophils Absolute: 0.1 10*3/uL (ref 0.0–0.5)
Eosinophils Relative: 1 %
HCT: 39.3 % (ref 36.0–46.0)
Hemoglobin: 13.5 g/dL (ref 12.0–15.0)
Immature Granulocytes: 1 %
Lymphocytes Relative: 28 %
Lymphs Abs: 2.4 10*3/uL (ref 0.7–4.0)
MCH: 31.3 pg (ref 26.0–34.0)
MCHC: 34.4 g/dL (ref 30.0–36.0)
MCV: 91 fL (ref 80.0–100.0)
Monocytes Absolute: 0.9 10*3/uL (ref 0.1–1.0)
Monocytes Relative: 10 %
Neutro Abs: 5.2 10*3/uL (ref 1.7–7.7)
Neutrophils Relative %: 60 %
Platelets: 316 10*3/uL (ref 150–400)
RBC: 4.32 MIL/uL (ref 3.87–5.11)
RDW: 13.9 % (ref 11.5–15.5)
WBC: 8.6 10*3/uL (ref 4.0–10.5)
nRBC: 0 % (ref 0.0–0.2)

## 2020-10-20 LAB — BASIC METABOLIC PANEL
Anion gap: 7 (ref 5–15)
BUN: 7 mg/dL — ABNORMAL LOW (ref 8–23)
CO2: 26 mmol/L (ref 22–32)
Calcium: 8.9 mg/dL (ref 8.9–10.3)
Chloride: 102 mmol/L (ref 98–111)
Creatinine, Ser: 0.5 mg/dL (ref 0.44–1.00)
GFR, Estimated: >60 mL/min (ref >60)
Glucose, Bld: 110 mg/dL — ABNORMAL HIGH (ref 70–99)
Potassium: 3.7 mmol/L (ref 3.5–5.1)
Sodium: 135 mmol/L (ref 135–145)

## 2020-10-20 LAB — COMPREHENSIVE METABOLIC PANEL
ALT: 12 U/L (ref 0–44)
AST: 12 U/L — ABNORMAL LOW (ref 15–41)
Albumin: 3.5 g/dL (ref 3.5–5.0)
Alkaline Phosphatase: 55 U/L (ref 38–126)
Anion gap: 6 (ref 5–15)
BUN: 7 mg/dL — ABNORMAL LOW (ref 8–23)
CO2: 27 mmol/L (ref 22–32)
Calcium: 8.9 mg/dL (ref 8.9–10.3)
Chloride: 103 mmol/L (ref 98–111)
Creatinine, Ser: 0.47 mg/dL (ref 0.44–1.00)
GFR, Estimated: >60 mL/min (ref >60)
Glucose, Bld: 111 mg/dL — ABNORMAL HIGH (ref 70–99)
Potassium: 3.4 mmol/L — ABNORMAL LOW (ref 3.5–5.1)
Sodium: 136 mmol/L (ref 135–145)
Total Bilirubin: 1 mg/dL (ref 0.3–1.2)
Total Protein: 5.6 g/dL — ABNORMAL LOW (ref 6.5–8.1)

## 2020-10-20 LAB — T4, FREE: Free T4: 1.12 ng/dL (ref 0.61–1.12)

## 2020-10-20 LAB — TSH
TSH: 0.473 u[IU]/mL (ref 0.350–4.500)
TSH: 0.456 u[IU]/mL (ref 0.350–4.500)

## 2020-10-20 LAB — PHOSPHORUS
Phosphorus: 1.9 mg/dL — ABNORMAL LOW (ref 2.5–4.6)
Phosphorus: 2.2 mg/dL — ABNORMAL LOW (ref 2.5–4.6)

## 2020-10-20 LAB — CK: Total CK: 45 U/L (ref 38–234)

## 2020-10-20 LAB — MAGNESIUM: Magnesium: 2 mg/dL (ref 1.7–2.4)

## 2020-10-20 LAB — HIV ANTIBODY (ROUTINE TESTING W REFLEX): HIV Screen 4th Generation wRfx: NONREACTIVE

## 2020-10-20 MED ORDER — ALPRAZOLAM 0.25 MG PO TABS
0.2500 mg | ORAL_TABLET | Freq: Every day | ORAL | Status: DC | PRN
  Administered 2020-10-20 – 2020-10-21 (×3): 0.25 mg via ORAL
  Filled 2020-10-20 (×3): qty 1

## 2020-10-20 MED ORDER — SODIUM CHLORIDE 0.9 % IV SOLN
12.5000 mg | Freq: Once | INTRAVENOUS | Status: AC
  Administered 2020-10-21: 12.5 mg via INTRAVENOUS
  Filled 2020-10-20: qty 12.5

## 2020-10-20 MED ORDER — PEG-KCL-NACL-NASULF-NA ASC-C 100 G PO SOLR
0.5000 | Freq: Once | ORAL | Status: AC
  Administered 2020-10-21: 100 g via ORAL

## 2020-10-20 MED ORDER — AMLODIPINE BESYLATE 10 MG PO TABS
10.0000 mg | ORAL_TABLET | Freq: Every day | ORAL | Status: DC
  Administered 2020-10-20 – 2020-10-21 (×3): 10 mg via ORAL
  Filled 2020-10-20 (×2): qty 1
  Filled 2020-10-20: qty 2

## 2020-10-20 MED ORDER — PEG-KCL-NACL-NASULF-NA ASC-C 100 G PO SOLR: 1.0000 | Freq: Once | ORAL | Status: DC

## 2020-10-20 MED ORDER — POTASSIUM CHLORIDE CRYS ER 20 MEQ PO TBCR
40.0000 meq | EXTENDED_RELEASE_TABLET | Freq: Once | ORAL | Status: AC
  Administered 2020-10-20: 40 meq via ORAL
  Filled 2020-10-20: qty 2

## 2020-10-20 MED ORDER — PEG-KCL-NACL-NASULF-NA ASC-C 100 G PO SOLR
0.5000 | Freq: Once | ORAL | Status: AC
  Administered 2020-10-20: 100 g via ORAL
  Filled 2020-10-20: qty 1

## 2020-10-20 MED ORDER — SODIUM CHLORIDE 0.9 % IV SOLN
75.0000 mL/h | INTRAVENOUS | Status: AC
  Administered 2020-10-20: 75 mL/h via INTRAVENOUS

## 2020-10-20 MED ORDER — ACETAMINOPHEN 650 MG RE SUPP: 650.0000 mg | Freq: Four times a day (QID) | RECTAL | Status: DC | PRN

## 2020-10-20 MED ORDER — LACTATED RINGERS IV SOLN: INTRAVENOUS | Status: DC

## 2020-10-20 MED ORDER — SODIUM CHLORIDE 0.9 % IV SOLN
12.5000 mg | Freq: Once | INTRAVENOUS | Status: AC
  Administered 2020-10-20: 12.5 mg via INTRAVENOUS
  Filled 2020-10-20: qty 12.5

## 2020-10-20 MED ORDER — SODIUM PHOSPHATES 45 MMOLE/15ML IV SOLN
10.0000 mmol | Freq: Once | INTRAVENOUS | Status: AC
  Administered 2020-10-20: 10 mmol via INTRAVENOUS
  Filled 2020-10-20: qty 3.33

## 2020-10-20 MED ORDER — ACETAMINOPHEN 325 MG PO TABS: 650.0000 mg | ORAL_TABLET | Freq: Four times a day (QID) | ORAL | Status: DC | PRN

## 2020-10-20 MED ORDER — ESTRADIOL 1 MG PO TABS
1.0000 mg | ORAL_TABLET | Freq: Two times a day (BID) | ORAL | Status: DC
  Administered 2020-10-20 – 2020-10-22 (×4): 1 mg via ORAL
  Filled 2020-10-20 (×5): qty 1

## 2020-10-20 MED ORDER — HYDROCODONE-ACETAMINOPHEN 5-325 MG PO TABS: 1.0000 | ORAL_TABLET | ORAL | Status: DC | PRN

## 2020-10-20 MED ORDER — AMLODIPINE BESYLATE 5 MG PO TABS: 10.0000 mg | ORAL_TABLET | Freq: Every day | ORAL | Status: DC

## 2020-10-20 NOTE — Consult Note (Addendum)
Referring Provider: Dr. Darliss Cheney  Primary Care Physician:  Tisovec, Fransico Him, MD Primary Gastroenterologist:  Dr. Owens Loffler   Reason for Consultation:  Nausea/vomiting after bowel prep  HPI: Anna Moore is a 65 y.o. female with a past medical history of anxiety, hypertension, hypothyroidism on Methimazole, parotid mass s/p resection, degenerative joint disease and colon polyps.  She has a history of colon polyps.  She underwent a colonoscopy by Dr. Ardis Hughs 06/02/2012 which identified 3 sessile serrated and tubular adenomatous polyps removed from the ascending and sigmoid colon. She was advised to repeat a colonoscopy in 3 years which was not done. However, she was scheduled for a colonoscopy with Dr. Ardis Hughs on 10/19/2020. She was unable to tolerate the bowel prep on 8/16 with persistent nausea and excessive vomiting (drank less than 1/2 of the bowel prep) therefore her procedure was canceled.  Her nausea and vomiting persisted throughout the day and she developed heart racing with associated shortness of breath.  She contacted our office and she was advised by Dr. Ardis Hughs to present to [facility] ED for further evaluation.  Labs in the ED showed a sodium level of 133.  Potassium 2.8.  BUN 13.  Creatinine 0.78.  Magnesium 1.8.  Anion gap 16.  Alk phos 76.  AST 18.  ALT 16.  Lipase 38.  Total bili 1.1.  Lactic acid 1.6.  Troponin 5.  WBC 13.6.  Hemoglobin 16.5.  Platelet 409.  INR 0.9.  TSH 0.456.  SARS coronavirus 2 negative.  Influenza A and B negative.  A chest x-ray showed chronic hyperinflation otherwise was unremarkable.  CTAP with contrast without evidence of any acute intra abdominal/pelvic inflammatory or infectious process.  No evidence of a bowel obstruction.  Physiologically distended gallbladder with a 5 mm hypodensity in the gallbladder fundus, gallbladder polyp  vs adherent stone.  No biliary dilatation.  No pericholecystic fat stranding.  Scattered cysts throughout the  liver, no suspicious liver lesions.  The left hepatic lobe wraps into the left upper quadrant most likely variant anatomy. She received Phenergan IV, LR 1 L IV bolus, Tylenol and KCl IV and p.o. in the ED.  She reports having 2 prior episodes of N/V and upper abdominal pain x 3 to 4 days which resulted in going to the ED Jan. 2022 and 06/07/2020. She was seen in the ED 04/04/2020 with N/V and generalized body aches, minimal diarrhea. K+ 2.6. NA+ 131. T. Bili 1.4. Normal AST/ALT levels. WBC 14.9. She received IV fluids, antiemetics and her symptoms improved so she was discharged home.   She was seen in the ED 06/07/2020 with generalized weakness with associated nausea, vomiting and diarrhea.  She was hypertensive and tachycardic in the ED at that time.  She received IV fluids, antiemetics and her symptoms significantly improved.  She had hypokalemia and she received potassium replacement.  She was discharged home on Phenergan as needed.  She underwent a [facility] HIDA scan on 07/06/2020 to rule out gallbladder dyskinesia which was normal.   She denies having any dysphagia or heartburn.  Infrequent NSAID use.  She intermittently has sweats and chills.  No weight loss.  She has intermittent constipation.  She typically passes a normal formed bowel movement daily but does not feel emptied.  She intermittently passes a narrow golden colored stool.  No rectal bleeding or melena.  He denies ever having an EGD.    CTAP with contrast 10/19/2020: 1. No acute abnormality in the abdomen/pelvis. No evidence of  bowel obstruction or inflammation. 2. A 5 mm hyperdensity in the gallbladder fundus likely represents a polyp versus less likely adherent stone. No pericholecystic fat stranding.  Aortic Atherosclerosis   [facility] HIDA scan 07/06/2020: 1. Normal gallbladder ejection fraction.  EF 47%. 2. Patent cystic duct and common bile duct.  Colonoscopy 06/02/2012 by Dr. Ardis Hughs: One 76m polyp was removed from the ascending  colon One 3 mm and one 10 mm polyp removed from the sigmoid colon. Surgical [P], ascending, sigmoid polyp x 2 - TUBULAR ADENOMA. - SESSILE SERRATED ADENOMA. - NEGATIVE FOR HIGH GRADE DYSPLASIA. 2. Surgical [P], sigmoid colon polyp - TUBULAR ADENOMA. - NEGATIVE FOR HIGH GRADE DYSPLASIA. - STALK BASE IS FREE OF ADENOMATOUS CHANGES   Past Medical History:  Diagnosis Date   Anxiety    DJD (degenerative joint disease)    Of the back   Hypertension    Thyroid disease    hyperthyroid    Past Surgical History:  Procedure Laterality Date   ABDOMINAL HYSTERECTOMY     BACK SURGERY  01/04/2008   On L4 and L5   COLONOSCOPY     NASAL SEPTUM SURGERY     OTHER SURGICAL HISTORY  03/05/1996   Hysterectomy   PAROTIDECTOMY  03/05/1988    Prior to Admission medications   Medication Sig Start Date End Date Taking? Authorizing Provider  ALPRAZolam (XANAX) 0.25 MG tablet Take 1 tablet (0.25 mg total) by mouth daily as needed. for anxiety Patient taking differently: Take 0.25 mg by mouth daily as needed for anxiety. 04/11/20  Yes BJearld Fenton NP  amLODipine (NORVASC) 10 MG tablet Take 1 tablet by mouth once daily 03/30/20  Yes Baity, RCoralie Keens NP  estradiol (ESTRACE) 1 MG tablet Take 1 tablet by mouth twice daily 04/13/20  Yes Baity, RCoralie Keens NP  gabapentin (NEURONTIN) 300 MG capsule Take 1 capsule by mouth at bedtime as needed (pain).   Yes [provider]  hydrochlorothiazide (HYDRODIURIL) 25 MG tablet Take 1 tablet by mouth once daily 05/02/20  Yes Baity, RCoralie Keens NP  promethazine (PHENERGAN) 25 MG tablet Take 1 tablet (25 mg total) by mouth every 6 (six) hours as needed for nausea or vomiting. 06/07/20  Yes MNoemi Chapel MD  magnesium oxide (MAG-OX) 400 MG tablet Take 1 tablet (400 mg total) by mouth daily. 04/04/20   PCharlesetta Shanks MD  methimazole (TAPAZOLE) 5 MG tablet TAKE 1 TABLET BY MOUTH THREE TIMES A WEEK 03/30/20   BJearld Fenton NP  potassium chloride (KLOR-CON) 10 MEQ  tablet Take 1 tablet (10 mEq total) by mouth daily. 06/07/20   MNoemi Chapel MD  potassium chloride SA (KLOR-CON) 20 MEQ tablet Take 1 tablet (20 mEq total) by mouth 2 (two) times daily. 04/04/20   PCharlesetta Shanks MD  promethazine (PHENERGAN) 25 MG suppository Place 1 suppository (25 mg total) rectally every 6 (six) hours as needed for nausea or vomiting. 06/07/20   MNoemi Chapel MD  zolpidem (AMBIEN) 5 MG tablet TAKE 1 TABLET BY MOUTH AT BEDTIME AS NEEDED FOR SLEEP 04/08/19   ALucille Passy MD    Current Facility-Administered Medications  Medication Dose Route Frequency Provider Last Rate Last Admin   0.9 %  sodium chloride infusion  75 mL/hr Intravenous Continuous Doutova, Anastassia, MD 75 mL/hr at 10/20/20 0035 75 mL/hr at 10/20/20 0035   acetaminophen (TYLENOL) tablet 650 mg  650 mg Oral Q6H PRN DToy Baker MD       Or   acetaminophen (TYLENOL) suppository 650  mg  650 mg Rectal Q6H PRN Toy Baker, MD       ALPRAZolam Duanne Moron) tablet 0.25 mg  0.25 mg Oral Daily PRN Toy Baker, MD   0.25 mg at 10/20/20 0052   amLODipine (NORVASC) tablet 10 mg  10 mg Oral QHS Toy Baker, MD   10 mg at 10/20/20 0053   estradiol (ESTRACE) tablet 1 mg  1 mg Oral BID Toy Baker, MD       HYDROcodone-acetaminophen (NORCO/VICODIN) 5-325 MG per tablet 1-2 tablet  1-2 tablet Oral Q4H PRN Toy Baker, MD       ondansetron (ZOFRAN) injection 4 mg  4 mg Intravenous Q6H PRN Doutova, Anastassia, MD       promethazine (PHENERGAN) 12.5 mg in sodium chloride 0.9 % 50 mL IVPB  12.5 mg Intravenous Q6H PRN Doutova, Anastassia, MD   Stopped at 10/19/20 1959   sodium phosphate 10 mmol in dextrose 5 % 250 mL infusion  10 mmol Intravenous Once Toy Baker, MD 42 mL/hr at 10/20/20 0214 10 mmol at 10/20/20 0214   Current Outpatient Medications  Medication Sig Dispense Refill   ALPRAZolam (XANAX) 0.25 MG tablet Take 1 tablet (0.25 mg total) by mouth daily as needed. for anxiety  (Patient taking differently: Take 0.25 mg by mouth daily as needed for anxiety.) 30 tablet 0   amLODipine (NORVASC) 10 MG tablet Take 1 tablet by mouth once daily 90 tablet 0   estradiol (ESTRACE) 1 MG tablet Take 1 tablet by mouth twice daily 180 tablet 0   gabapentin (NEURONTIN) 300 MG capsule Take 1 capsule by mouth at bedtime as needed (pain).     hydrochlorothiazide (HYDRODIURIL) 25 MG tablet Take 1 tablet by mouth once daily 90 tablet 0   promethazine (PHENERGAN) 25 MG tablet Take 1 tablet (25 mg total) by mouth every 6 (six) hours as needed for nausea or vomiting. 12 tablet 0   magnesium oxide (MAG-OX) 400 MG tablet Take 1 tablet (400 mg total) by mouth daily. 14 tablet 0   methimazole (TAPAZOLE) 5 MG tablet TAKE 1 TABLET BY MOUTH THREE TIMES A WEEK 36 tablet 0   potassium chloride (KLOR-CON) 10 MEQ tablet Take 1 tablet (10 mEq total) by mouth daily. 7 tablet 0   potassium chloride SA (KLOR-CON) 20 MEQ tablet Take 1 tablet (20 mEq total) by mouth 2 (two) times daily. 14 tablet 0   promethazine (PHENERGAN) 25 MG suppository Place 1 suppository (25 mg total) rectally every 6 (six) hours as needed for nausea or vomiting. 25 suppository 0   zolpidem (AMBIEN) 5 MG tablet TAKE 1 TABLET BY MOUTH AT BEDTIME AS NEEDED FOR SLEEP 90 tablet 1    Allergies as of 10/19/2020 - Review Complete 10/19/2020  Allergen Reaction Noted   Nitrofuran derivatives  06/07/2020    Family History  Problem Relation Age of Onset   Atrial fibrillation Mother    Hypertension Father    Heart disease Father    Heart attack Father        50's   Cancer Father        non hodgkins lymphoma   Diabetes Father    Colon cancer Neg Hx    Thyroid disease Neg Hx    Esophageal cancer Neg Hx    Rectal cancer Neg Hx    Stomach cancer Neg Hx     Social History   Socioeconomic History   Marital status: Married    Spouse name: Not on file   Number of children:  Not on file   Years of education: Not on file   Highest  education level: Not on file  Occupational History   Not on file  Tobacco Use   Smoking status: Former    Types: Cigarettes    Quit date: 04/08/2016    Years since quitting: 4.5   Smokeless tobacco: Never  Vaping Use   Vaping Use: Never used  Substance and Sexual Activity   Alcohol use: Yes    Alcohol/week: 6.0 standard drinks    Types: 6 Cans of beer per week    Comment: 5 beers a week   Drug use: No   Sexual activity: Not on file  Other Topics Concern   Not on file  Social History Narrative   Married.  One daughter, 49 yo- moving back in with her.   Retired Medical illustrator.   Social Determinants of Health   Financial Resource Strain: Not on file  Food Insecurity: Not on file  Transportation Needs: Not on file  Physical Activity: Not on file  Stress: Not on file  Social Connections: Not on file  Intimate Partner Violence: Not on file    Review of Systems: Gen: Denies fever, sweats or chills. No weight loss.  CV: Denies chest pain, palpitations or edema. Resp: Denies cough, shortness of breath of hemoptysis.  GI: See HPI.   GU : Denies urinary burning, blood in urine, increased urinary frequency or incontinence. MS: Denies joint pain, muscles aches or weakness. Derm: Denies rash, itchiness, skin lesions or unhealing ulcers. Psych: Denies depression, anxiety, memory loss or confusion. Heme: Denies easy bruising, bleeding. Neuro:  Denies headaches, dizziness or paresthesias. Endo:  Denies any problems with DM, thyroid or adrenal function.  Physical Exam: Vital signs in last 24 hours: Temp:  [98.2 F (36.8 C)-100 F (37.8 C)] 98.2 F (36.8 C) (08/17 2230) Pulse Rate:  [63-132] 63 (08/18 0600) Resp:  [14-25] 20 (08/18 0600) BP: (103-171)/(58-105) 117/71 (08/18 0600) SpO2:  [92 %-100 %] 95 % (08/18 0600) Weight:  [61.2 kg] 61.2 kg (08/17 1616)   General:  Alert 65 year old female in no acute distress. Head:  Normocephalic and atraumatic. Eyes:  No scleral  icterus. Conjunctiva pink. Ears:  Normal auditory acuity. Nose:  No deformity, discharge or lesions. Mouth:  Dentition intact. No ulcers or lesions.  Neck:  Supple. No lymphadenopathy or thyromegaly.  Lungs: Breath sounds clear throughout. Heart: Regular rate and rhythm, no murmurs. Abdomen: Soft, nondistended, mild epigastric tenderness without rebound or guarding.  Positive bowel sounds all 4 quadrants. Rectal: Deferred. Musculoskeletal:  Symmetrical without gross deformities.  Pulses:  Normal pulses noted. Extremities:  Without clubbing or edema. Neurologic:  Alert and  oriented x4. No focal deficits.  Skin:  Intact without significant lesions or rashes. Psych:  Alert and cooperative. Normal mood and affect.  Intake/Output from previous day: 08/17 0701 - 08/18 0700 In: 1636.9 [IV Piggyback:1636.9] Out: -  Intake/Output this shift: No intake/output data recorded.  Lab Results: Recent Labs    10/19/20 1553 10/19/20 1642 10/20/20 0315  WBC 13.6*  --  8.6  HGB 16.5* 17.3* 13.5  HCT 47.2* 51.0* 39.3  PLT 409*  --  316   BMET Recent Labs    10/19/20 1553 10/19/20 1642 10/19/20 2325 10/20/20 0315  NA 133* 130* 135 136  K 2.8* 2.7* 3.7 3.4*  CL 93* 93* 102 103  CO2 24  --  26 27  GLUCOSE 136* 140* 110* 111*  BUN 13 11 7* 7*  CREATININE 0.78 0.70 0.50 0.47  CALCIUM 10.2  --  8.9 8.9   LFT Recent Labs    10/20/20 0315  PROT 5.6*  ALBUMIN 3.5  AST 12*  ALT 12  ALKPHOS 55  BILITOT 1.0   PT/INR Recent Labs    10/19/20 1615  LABPROT 12.6  INR 0.9   Hepatitis Panel No results for input(s): HEPBSAG, HCVAB, HEPAIGM, HEPBIGM in the last 72 hours.    Studies/Results: CT Abdomen Pelvis W Contrast  Result Date: 10/19/2020 CLINICAL DATA:  Nausea and vomiting. Vomiting while taking prep for colonoscopy. EXAM: CT ABDOMEN AND PELVIS WITH CONTRAST TECHNIQUE: Multidetector CT imaging of the abdomen and pelvis was performed using the standard protocol following  bolus administration of intravenous contrast. CONTRAST:  53m OMNIPAQUE IOHEXOL 350 MG/ML SOLN COMPARISON:  CT 10/01/2019 FINDINGS: Lower chest: Minor bandlike scarring in the left lower lobe. No acute airspace disease. No pleural effusion. Hepatobiliary: Scattered cysts throughout the liver. No suspicious liver lesion. Left hepatic lobe again wraps into the left upper quadrant, variant anatomy. Physiologically distended gallbladder. 5 mm hyperdensity in the non dependent gallbladder fundus likely represents a polyp versus less likely adherent stone. No pericholecystic fat stranding. No biliary dilatation. Pancreas: No ductal dilatation or inflammation. Spleen: Lobulated splenic contours.  Normal in size. Adrenals/Urinary Tract: Normal adrenal glands. No hydronephrosis or perinephric edema. Homogeneous renal enhancement with symmetric excretion on delayed phase imaging. Unchanged small cyst in the anterior lower right kidney. No suspicious renal lesion. No visualized renal stone. Urinary bladder is physiologically distended without wall thickening. Stomach/Bowel: Stomach is partially distended. No abnormal gastric distension. There is no small bowel inflammation or abnormal distention/obstruction. Normal appendix. Small volume of colonic stool. No colonic wall thickening or inflammation. Vascular/Lymphatic: Normal caliber abdominal aorta. Mild atherosclerosis. Patent portal vein. Circumaortic left renal vein. No acute vascular findings. No enlarged lymph nodes in the abdomen or pelvis. Reproductive: Hysterectomy.  Stable appearance of the adnexa. Other: No free air, free fluid, or intra-abdominal fluid collection. Musculoskeletal: Anterior fusion hardware at L4-L5. Near complete disc space loss at L3-L4. IMPRESSION: 1. No acute abnormality in the abdomen/pelvis. No evidence of bowel obstruction or inflammation. 2. A 5 mm hyperdensity in the gallbladder fundus likely represents a polyp versus less likely adherent  stone. No pericholecystic fat stranding. Aortic Atherosclerosis (ICD10-I70.0). Electronically Signed   By: MKeith RakeM.D.   On: 10/19/2020 19:07   DG Chest Port 1 View  Result Date: 10/19/2020 CLINICAL DATA:  Questionable sepsis - evaluate for abnormality Shortness of breath. EXAM: PORTABLE CHEST 1 VIEW COMPARISON:  04/04/2020 FINDINGS: Similar hyperinflation of prior exam. Normal heart size and mediastinal contours. No focal airspace disease. No pleural effusion or pneumothorax. No pulmonary edema. No acute osseous abnormalities are seen. IMPRESSION: Chronic hyperinflation without acute abnormality. Electronically Signed   By: MKeith RakeM.D.   On: 10/19/2020 17:46    IMPRESSION/PLAN:  [age]year old female with N/V and dehydration after taking a bowel prep  8/16 for a scheduled outpatient colonoscopy on 8/17. Normal LFTs and lipase level. She also had N/V +/- diarrhea episodes which required ER evaluations Jan and April 2022. CTAP with contrast 10/19/2020 without acute intraabdominal/pelvic inflammatory/ infectious process or bowel obstruction.  Physiologically distended gallbladder with a 541mhypodensity in the gallbladder fundus, gallbladder polyp vs adherent stone.  No biliary dilatation.  No pericholecystic fat stranding.  Scattered cysts throughout the liver, no suspicious liver lesions.   -EGD and colonoscopy tomorrow with Dr. GuLyndel SafeEGD and colonoscopy benefits  and risks discussed including risk with sedation, risk of bleeding, perforation and infection  -Phenergan 12.83m IV Q 6hrs  PRN and prior to bowel prep doses  -RUQ sonogram to further assess the gallbladder  -Continue IV fluids -KCL replacement as needed  -Clear liquid diet -Pantoprazole 453mpo QD -BMP and CBC in am -Await further recommendations per Dr .GuLyndel Safe2) Hypokalemia. K+ 2.8 -> 3.4. -KCL replacement per the hospitalist   3) History of tubular adenomatous and sessile serrated colon polyps -Colonoscopy as  ordered above  4) Leukocytosis, unclear etiology. She is afebrile.    CoNoralyn Pick8/18/2022, 09:35 AM    Attending physician's note   I have taken an interval history, reviewed the chart and examined the patient. I agree with the Advanced Practitioner's note, impression and recommendations.   Intermittent N/V with upper abdo pain. Neg CT AP 8/17, neg HIDA 07/2020 IBS-C with occ diarrhea episodes H/O polyps 07/2012.  Could not tolerate outpt prep  Plan: -RUQ [place] -EGD/colon in AM. -Continue Protonix.    I discussed EGD/Colonoscopy- the indications, risks, alternatives and potential complications including, but not limited to, bleeding, infection, reaction to medication, damage to internal organs, cardiac and/or pulmonary problems, and perforation requiring surgery (1 to 2 in 1000). The possibility that significant findings could be missed was explained. All ? were answered. The patient gives verbal consent to proceed.   RaCarmell AustriaMD LeVelora HecklerI [phone]

## 2020-10-20 NOTE — Progress Notes (Signed)
PROGRESS NOTE    Anna Moore  AST:[id number] DOB: 1956/02/22 DOA: 10/19/2020 PCP: Haywood Pao, MD   Brief Narrative:  Anna Moore is a 65 y.o. female with medical history significant of hyperthyroidism, parotid mass sp resection, HTN presented to ED with concerns of dehydration, rapid heart rate, nausea and vomiting.  Patient was in the process of colonoscopy prep and continued to have diarrhea.  Due to her concerns mentioned above, she came to the emergency department.  Upon arrival to ED, she was slightly tachycardic with blood pressure elevated.  Subsequently she was admitted due to nausea vomiting and diarrhea, dehydration as well as hypokalemia.  Assessment & Plan:   Active Problems:   Hypertension   Hyperthyroidism   Hyperlipidemia   Nausea vomiting and diarrhea   Dehydration   Hypokalemia  Nausea/vomiting/diarrhea/dehydration: No more vomiting or diarrhea.  Some nausea but not as bad as it was yesterday.  Still feels weak.  CBC shows improved hemoconcentration that she had yesterday.  We will resume gentle IV hydration.  Continue as needed antiemetics.  Colon cancer screening: Patient was scheduled to have colonoscopy as outpatient.  GI has seen her here.  They are requesting to keep her here so they can do EGD and colonoscopy in the morning.  Will defer to them about bowel prep.  She is on clears now and will be n.p.o. from midnight.  Hypothyroidism: Continue Synthroid.  Essential hypertension: Controlled.  Continue Norvasc and continue to hold hydrochlorothiazide.  Hypokalemia: Improved but still low.  Will replace.  DVT prophylaxis: SCDs Start: 10/20/20 0028   Code Status: Full Code  Family Communication:  None present at bedside.  Plan of care discussed with patient in length and he verbalized understanding and agreed with it.  Status is: Observation  The patient will require care spanning > 2 midnights and should be moved to inpatient because: Ongoing  diagnostic testing needed not appropriate for outpatient work up  Dispo: The patient is from: Home              Anticipated d/c is to: Home              Patient currently is not medically stable to d/c.   Difficult to place patient No        Estimated body mass index is 22.29 kg/m as calculated from the following:   Height as of this encounter: 5' 5.25" (1.657 m).   Weight as of this encounter: 61.2 kg.     Nutritional Assessment: Body mass index is 22.29 kg/m.Marland Kitchen Seen by dietician.  I agree with the assessment and plan as outlined below: Nutrition Status:        .  Skin Assessment: I have examined the patient's skin and I agree with the wound assessment as performed by the wound care RN as outlined below:    Consultants:  GI  Procedures:  None  Antimicrobials:  Anti-infectives (From admission, onward)    None          Subjective: Seen and examined.  She has no complaint other than mild intermittent nausea and some weakness.  No diarrhea or vomiting.  No abdominal pain.  No chest pain or shortness of breath.  Objective: Vitals:   10/20/20 0600 10/20/20 0700 10/20/20 0825 10/20/20 1000  BP: 117/71 116/62 134/75 122/64  Pulse: 63 71 84 84  Resp: 20 20 (!) 21 16  Temp:      TempSrc:      SpO2: 95% 94% 96%  99%  Weight:      Height:        Intake/Output Summary (Last 24 hours) at 10/20/2020 1133 Last data filed at 10/20/2020 0032 Gross per 24 hour  Intake 1636.94 ml  Output --  Net 1636.94 ml   Filed Weights   10/19/20 1616  Weight: 61.2 kg    Examination:  General exam: Appears calm and comfortable  Respiratory system: Clear to auscultation. Respiratory effort normal. Cardiovascular system: S1 & S2 heard, RRR. No JVD, murmurs, rubs, gallops or clicks. No pedal edema. Gastrointestinal system: Abdomen is nondistended, soft and nontender. No organomegaly or masses felt. Normal bowel sounds heard. Central nervous system: Alert and oriented. No  focal neurological deficits. Extremities: Symmetric 5 x 5 power. Skin: No rashes, lesions or ulcers Psychiatry: Judgement and insight appear normal. Mood & affect appropriate.    Data Reviewed: I have personally reviewed following labs and imaging studies  CBC: Recent Labs  Lab 10/19/20 1553 10/19/20 1642 10/20/20 0315  WBC 13.6*  --  8.6  NEUTROABS 10.2*  --  5.2  HGB 16.5* 17.3* 13.5  HCT 47.2* 51.0* 39.3  MCV 88.7  --  91.0  PLT 409*  --  956   Basic Metabolic Panel: Recent Labs  Lab 10/19/20 1553 10/19/20 1642 10/19/20 2325 10/20/20 0315  NA 133* 130* 135 136  K 2.8* 2.7* 3.7 3.4*  CL 93* 93* 102 103  CO2 24  --  26 27  GLUCOSE 136* 140* 110* 111*  BUN 13 11 7* 7*  CREATININE 0.78 0.70 0.50 0.47  CALCIUM 10.2  --  8.9 8.9  MG 1.8  --   --  2.0  PHOS  --   --  1.9* 2.2*   GFR: Estimated Creatinine Clearance: 63.8 mL/min (by C-G formula based on SCr of 0.47 mg/dL). Liver Function Tests: Recent Labs  Lab 10/19/20 1553 10/20/20 0315  AST 18 12*  ALT 16 12  ALKPHOS 76 55  BILITOT 1.1 1.0  PROT 7.7 5.6*  ALBUMIN 4.7 3.5   Recent Labs  Lab 10/19/20 1553  LIPASE 38   No results for input(s): AMMONIA in the last 168 hours. Coagulation Profile: Recent Labs  Lab 10/19/20 1615  INR 0.9   Cardiac Enzymes: Recent Labs  Lab 10/19/20 2330  CKTOTAL 45   BNP (last 3 results) No results for input(s): PROBNP in the last 8760 hours. HbA1C: No results for input(s): HGBA1C in the last 72 hours. CBG: No results for input(s): GLUCAP in the last 168 hours. Lipid Profile: No results for input(s): CHOL, HDL, LDLCALC, TRIG, CHOLHDL, LDLDIRECT in the last 72 hours. Thyroid Function Tests: Recent Labs    10/19/20 2325 10/20/20 0315  TSH  --  0.456  FREET4 1.12  --    Anemia Panel: No results for input(s): VITAMINB12, FOLATE, FERRITIN, TIBC, IRON, RETICCTPCT in the last 72 hours. Sepsis Labs: Recent Labs  Lab 10/19/20 1615  LATICACIDVEN 1.6     Recent Results (from the past 240 hour(s))  Resp Panel by RT-PCR (Flu A&B, Covid) Nasopharyngeal Swab     Status: None   Collection Time: 10/19/20  4:16 PM   Specimen: Nasopharyngeal Swab; Nasopharyngeal(NP) swabs in vial transport medium  Result Value Ref Range Status   SARS Coronavirus 2 by RT PCR NEGATIVE NEGATIVE Final    Comment: (NOTE) SARS-CoV-2 target nucleic acids are NOT DETECTED.  The SARS-CoV-2 RNA is generally detectable in upper respiratory specimens during the acute phase of infection. The lowest concentration  of SARS-CoV-2 viral copies this assay can detect is 138 copies/mL. A negative result does not preclude SARS-Cov-2 infection and should not be used as the sole basis for treatment or other patient management decisions. A negative result may occur with  improper specimen collection/handling, submission of specimen other than nasopharyngeal swab, presence of viral mutation(s) within the areas targeted by this assay, and inadequate number of viral copies(<138 copies/mL). A negative result must be combined with clinical observations, patient history, and epidemiological information. The expected result is Negative.  Fact Sheet for Patients:  EntrepreneurPulse.com.au  Fact Sheet for Healthcare Providers:  IncredibleEmployment.be  This test is no t yet approved or cleared by the [place] FDA and  has been authorized for detection and/or diagnosis of SARS-CoV-2 by FDA under an Emergency Use Authorization (EUA). This EUA will remain  in effect (meaning this test can be used) for the duration of the COVID-19 declaration under Section 564(b)(1) of the Act, 21 U.S.C.section 360bbb-3(b)(1), unless the authorization is terminated  or revoked sooner.       Influenza A by PCR NEGATIVE NEGATIVE Final   Influenza B by PCR NEGATIVE NEGATIVE Final    Comment: (NOTE) The Xpert Xpress SARS-CoV-2/FLU/RSV plus assay is intended as an  aid in the diagnosis of influenza from Nasopharyngeal swab specimens and should not be used as a sole basis for treatment. Nasal washings and aspirates are unacceptable for Xpert Xpress SARS-CoV-2/FLU/RSV testing.  Fact Sheet for Patients: EntrepreneurPulse.com.au  Fact Sheet for Healthcare Providers: IncredibleEmployment.be  This test is not yet approved or cleared by the [place] FDA and has been authorized for detection and/or diagnosis of SARS-CoV-2 by FDA under an Emergency Use Authorization (EUA). This EUA will remain in effect (meaning this test can be used) for the duration of the COVID-19 declaration under Section 564(b)(1) of the Act, 21 U.S.C. section 360bbb-3(b)(1), unless the authorization is terminated or revoked.  Performed at [facility], [place] [address], [place], [place] [address]       Radiology Studies: CT Abdomen Pelvis W Contrast  Result Date: 10/19/2020 CLINICAL DATA:  Nausea and vomiting. Vomiting while taking prep for colonoscopy. EXAM: CT ABDOMEN AND PELVIS WITH CONTRAST TECHNIQUE: Multidetector CT imaging of the abdomen and pelvis was performed using the standard protocol following bolus administration of intravenous contrast. CONTRAST:  46m OMNIPAQUE IOHEXOL 350 MG/ML SOLN COMPARISON:  CT 10/01/2019 FINDINGS: Lower chest: Minor bandlike scarring in the left lower lobe. No acute airspace disease. No pleural effusion. Hepatobiliary: Scattered cysts throughout the liver. No suspicious liver lesion. Left hepatic lobe again wraps into the left upper quadrant, variant anatomy. Physiologically distended gallbladder. 5 mm hyperdensity in the non dependent gallbladder fundus likely represents a polyp versus less likely adherent stone. No pericholecystic fat stranding. No biliary dilatation. Pancreas: No ductal dilatation or inflammation. Spleen: Lobulated splenic contours.  Normal in size. Adrenals/Urinary  Tract: Normal adrenal glands. No hydronephrosis or perinephric edema. Homogeneous renal enhancement with symmetric excretion on delayed phase imaging. Unchanged small cyst in the anterior lower right kidney. No suspicious renal lesion. No visualized renal stone. Urinary bladder is physiologically distended without wall thickening. Stomach/Bowel: Stomach is partially distended. No abnormal gastric distension. There is no small bowel inflammation or abnormal distention/obstruction. Normal appendix. Small volume of colonic stool. No colonic wall thickening or inflammation. Vascular/Lymphatic: Normal caliber abdominal aorta. Mild atherosclerosis. Patent portal vein. Circumaortic left renal vein. No acute vascular findings. No enlarged lymph nodes in the abdomen or pelvis. Reproductive: Hysterectomy.  Stable  appearance of the adnexa. Other: No free air, free fluid, or intra-abdominal fluid collection. Musculoskeletal: Anterior fusion hardware at L4-L5. Near complete disc space loss at L3-L4. IMPRESSION: 1. No acute abnormality in the abdomen/pelvis. No evidence of bowel obstruction or inflammation. 2. A 5 mm hyperdensity in the gallbladder fundus likely represents a polyp versus less likely adherent stone. No pericholecystic fat stranding. Aortic Atherosclerosis (ICD10-I70.0). Electronically Signed   By: Keith Rake M.D.   On: 10/19/2020 19:07   DG Chest Port 1 View  Result Date: 10/19/2020 CLINICAL DATA:  Questionable sepsis - evaluate for abnormality Shortness of breath. EXAM: PORTABLE CHEST 1 VIEW COMPARISON:  04/04/2020 FINDINGS: Similar hyperinflation of prior exam. Normal heart size and mediastinal contours. No focal airspace disease. No pleural effusion or pneumothorax. No pulmonary edema. No acute osseous abnormalities are seen. IMPRESSION: Chronic hyperinflation without acute abnormality. Electronically Signed   By: Keith Rake M.D.   On: 10/19/2020 17:46    Scheduled Meds:  amLODipine  10 mg  Oral QHS   estradiol  1 mg Oral BID   peg 3350 powder  0.5 kit Oral Once   And   [START ON 10/21/2020] peg 3350 powder  0.5 kit Oral Once   Continuous Infusions:  promethazine (PHENERGAN) injection (IM or IVPB) Stopped (10/19/20 1959)   promethazine (PHENERGAN) injection (IM or IVPB)     [START ON 10/21/2020] promethazine (PHENERGAN) injection (IM or IVPB)       LOS: 0 days   Time spent: 35 minutes   Darliss Cheney, MD [facility]  10/20/2020, 11:33 AM   How to contact the [facility] Attending or Consulting provider [place] or covering provider during after hours [place], for this patient?  Check the care team in [facility] and look for a) attending/consulting [facility] provider listed and b) the [facility] team listed. Page or secure chat [phone]. Log into [email or web address] and use [place]'s universal password to access. If you do not have the password, please contact the hospital operator. Locate the [facility] provider you are looking for under [facility] and page to a number that you can be directly reached. If you still have difficulty reaching the provider, please page the [facility] (Director on Call) for the Hospitalists listed on amion for assistance.

## 2020-10-20 NOTE — H&P (View-Only) (Signed)
Referring Provider: Dr. Darliss Cheney  Primary Care Physician:  Tisovec, Fransico Him, MD Primary Gastroenterologist:  Dr. Owens Loffler   Reason for Consultation:  Nausea/vomiting after bowel prep  HPI: Anna Moore is a 64 y.o. female with a past medical history of anxiety, hypertension, hypothyroidism on Methimazole, parotid mass s/p resection, degenerative joint disease and colon polyps.  She has a history of colon polyps.  She underwent a colonoscopy by Dr. Ardis Hughs 06/02/2012 which identified 3 sessile serrated and tubular adenomatous polyps removed from the ascending and sigmoid colon. She was advised to repeat a colonoscopy in 3 years which was not done. However, she was scheduled for a colonoscopy with Dr. Ardis Hughs on 10/19/2020. She was unable to tolerate the bowel prep on 8/16 with persistent nausea and excessive vomiting (drank less than 1/2 of the bowel prep) therefore her procedure was canceled.  Her nausea and vomiting persisted throughout the day and she developed heart racing with associated shortness of breath.  She contacted our office and she was advised by Dr. Ardis Hughs to present to [facility] ED for further evaluation.  Labs in the ED showed a sodium level of 133.  Potassium 2.8.  BUN 13.  Creatinine 0.78.  Magnesium 1.8.  Anion gap 16.  Alk phos 76.  AST 18.  ALT 16.  Lipase 38.  Total bili 1.1.  Lactic acid 1.6.  Troponin 5.  WBC 13.6.  Hemoglobin 16.5.  Platelet 409.  INR 0.9.  TSH 0.456.  SARS coronavirus 2 negative.  Influenza A and B negative.  A chest x-ray showed chronic hyperinflation otherwise was unremarkable.  CTAP with contrast without evidence of any acute intra abdominal/pelvic inflammatory or infectious process.  No evidence of a bowel obstruction.  Physiologically distended gallbladder with a 5 mm hypodensity in the gallbladder fundus, gallbladder polyp  vs adherent stone.  No biliary dilatation.  No pericholecystic fat stranding.  Scattered cysts throughout the  liver, no suspicious liver lesions.  The left hepatic lobe wraps into the left upper quadrant most likely variant anatomy. She received Phenergan IV, LR 1 L IV bolus, Tylenol and KCl IV and p.o. in the ED.  She reports having 2 prior episodes of N/V and upper abdominal pain x 3 to 4 days which resulted in going to the ED Jan. 2022 and 06/07/2020. She was seen in the ED 04/04/2020 with N/V and generalized body aches, minimal diarrhea. K+ 2.6. NA+ 131. T. Bili 1.4. Normal AST/ALT levels. WBC 14.9. She received IV fluids, antiemetics and her symptoms improved so she was discharged home.   She was seen in the ED 06/07/2020 with generalized weakness with associated nausea, vomiting and diarrhea.  She was hypertensive and tachycardic in the ED at that time.  She received IV fluids, antiemetics and her symptoms significantly improved.  She had hypokalemia and she received potassium replacement.  She was discharged home on Phenergan as needed.  She underwent a [facility] HIDA scan on 07/06/2020 to rule out gallbladder dyskinesia which was normal.   She denies having any dysphagia or heartburn.  Infrequent NSAID use.  She intermittently has sweats and chills.  No weight loss.  She has intermittent constipation.  She typically passes a normal formed bowel movement daily but does not feel emptied.  She intermittently passes a narrow golden colored stool.  No rectal bleeding or melena.  He denies ever having an EGD.    CTAP with contrast 10/19/2020: 1. No acute abnormality in the abdomen/pelvis. No evidence of  bowel obstruction or inflammation. 2. A 5 mm hyperdensity in the gallbladder fundus likely represents a polyp versus less likely adherent stone. No pericholecystic fat stranding.  Aortic Atherosclerosis   [facility] HIDA scan 07/06/2020: 1. Normal gallbladder ejection fraction.  EF 47%. 2. Patent cystic duct and common bile duct.  Colonoscopy 06/02/2012 by Dr. Ardis Hughs: One 52m polyp was removed from the ascending  colon One 3 mm and one 10 mm polyp removed from the sigmoid colon. Surgical [P], ascending, sigmoid polyp x 2 - TUBULAR ADENOMA. - SESSILE SERRATED ADENOMA. - NEGATIVE FOR HIGH GRADE DYSPLASIA. 2. Surgical [P], sigmoid colon polyp - TUBULAR ADENOMA. - NEGATIVE FOR HIGH GRADE DYSPLASIA. - STALK BASE IS FREE OF ADENOMATOUS CHANGES   Past Medical History:  Diagnosis Date   Anxiety    DJD (degenerative joint disease)    Of the back   Hypertension    Thyroid disease    hyperthyroid    Past Surgical History:  Procedure Laterality Date   ABDOMINAL HYSTERECTOMY     BACK SURGERY  01/04/2008   On L4 and L5   COLONOSCOPY     NASAL SEPTUM SURGERY     OTHER SURGICAL HISTORY  03/05/1996   Hysterectomy   PAROTIDECTOMY  03/05/1988    Prior to Admission medications   Medication Sig Start Date End Date Taking? Authorizing Provider  ALPRAZolam (XANAX) 0.25 MG tablet Take 1 tablet (0.25 mg total) by mouth daily as needed. for anxiety Patient taking differently: Take 0.25 mg by mouth daily as needed for anxiety. 04/11/20  Yes BJearld Fenton NP  amLODipine (NORVASC) 10 MG tablet Take 1 tablet by mouth once daily 03/30/20  Yes Baity, RCoralie Keens NP  estradiol (ESTRACE) 1 MG tablet Take 1 tablet by mouth twice daily 04/13/20  Yes Baity, RCoralie Keens NP  gabapentin (NEURONTIN) 300 MG capsule Take 1 capsule by mouth at bedtime as needed (pain).   Yes [provider]  hydrochlorothiazide (HYDRODIURIL) 25 MG tablet Take 1 tablet by mouth once daily 05/02/20  Yes Baity, RCoralie Keens NP  promethazine (PHENERGAN) 25 MG tablet Take 1 tablet (25 mg total) by mouth every 6 (six) hours as needed for nausea or vomiting. 06/07/20  Yes MNoemi Chapel MD  magnesium oxide (MAG-OX) 400 MG tablet Take 1 tablet (400 mg total) by mouth daily. 04/04/20   PCharlesetta Shanks MD  methimazole (TAPAZOLE) 5 MG tablet TAKE 1 TABLET BY MOUTH THREE TIMES A WEEK 03/30/20   BJearld Fenton NP  potassium chloride (KLOR-CON) 10 MEQ  tablet Take 1 tablet (10 mEq total) by mouth daily. 06/07/20   MNoemi Chapel MD  potassium chloride SA (KLOR-CON) 20 MEQ tablet Take 1 tablet (20 mEq total) by mouth 2 (two) times daily. 04/04/20   PCharlesetta Shanks MD  promethazine (PHENERGAN) 25 MG suppository Place 1 suppository (25 mg total) rectally every 6 (six) hours as needed for nausea or vomiting. 06/07/20   MNoemi Chapel MD  zolpidem (AMBIEN) 5 MG tablet TAKE 1 TABLET BY MOUTH AT BEDTIME AS NEEDED FOR SLEEP 04/08/19   ALucille Passy MD    Current Facility-Administered Medications  Medication Dose Route Frequency Provider Last Rate Last Admin   0.9 %  sodium chloride infusion  75 mL/hr Intravenous Continuous Doutova, Anastassia, MD 75 mL/hr at 10/20/20 0035 75 mL/hr at 10/20/20 0035   acetaminophen (TYLENOL) tablet 650 mg  650 mg Oral Q6H PRN DToy Baker MD       Or   acetaminophen (TYLENOL) suppository 650  mg  650 mg Rectal Q6H PRN Toy Baker, MD       ALPRAZolam Duanne Moron) tablet 0.25 mg  0.25 mg Oral Daily PRN Toy Baker, MD   0.25 mg at 10/20/20 0052   amLODipine (NORVASC) tablet 10 mg  10 mg Oral QHS Toy Baker, MD   10 mg at 10/20/20 0053   estradiol (ESTRACE) tablet 1 mg  1 mg Oral BID Toy Baker, MD       HYDROcodone-acetaminophen (NORCO/VICODIN) 5-325 MG per tablet 1-2 tablet  1-2 tablet Oral Q4H PRN Toy Baker, MD       ondansetron (ZOFRAN) injection 4 mg  4 mg Intravenous Q6H PRN Doutova, Anastassia, MD       promethazine (PHENERGAN) 12.5 mg in sodium chloride 0.9 % 50 mL IVPB  12.5 mg Intravenous Q6H PRN Doutova, Anastassia, MD   Stopped at 10/19/20 1959   sodium phosphate 10 mmol in dextrose 5 % 250 mL infusion  10 mmol Intravenous Once Toy Baker, MD 42 mL/hr at 10/20/20 0214 10 mmol at 10/20/20 0214   Current Outpatient Medications  Medication Sig Dispense Refill   ALPRAZolam (XANAX) 0.25 MG tablet Take 1 tablet (0.25 mg total) by mouth daily as needed. for anxiety  (Patient taking differently: Take 0.25 mg by mouth daily as needed for anxiety.) 30 tablet 0   amLODipine (NORVASC) 10 MG tablet Take 1 tablet by mouth once daily 90 tablet 0   estradiol (ESTRACE) 1 MG tablet Take 1 tablet by mouth twice daily 180 tablet 0   gabapentin (NEURONTIN) 300 MG capsule Take 1 capsule by mouth at bedtime as needed (pain).     hydrochlorothiazide (HYDRODIURIL) 25 MG tablet Take 1 tablet by mouth once daily 90 tablet 0   promethazine (PHENERGAN) 25 MG tablet Take 1 tablet (25 mg total) by mouth every 6 (six) hours as needed for nausea or vomiting. 12 tablet 0   magnesium oxide (MAG-OX) 400 MG tablet Take 1 tablet (400 mg total) by mouth daily. 14 tablet 0   methimazole (TAPAZOLE) 5 MG tablet TAKE 1 TABLET BY MOUTH THREE TIMES A WEEK 36 tablet 0   potassium chloride (KLOR-CON) 10 MEQ tablet Take 1 tablet (10 mEq total) by mouth daily. 7 tablet 0   potassium chloride SA (KLOR-CON) 20 MEQ tablet Take 1 tablet (20 mEq total) by mouth 2 (two) times daily. 14 tablet 0   promethazine (PHENERGAN) 25 MG suppository Place 1 suppository (25 mg total) rectally every 6 (six) hours as needed for nausea or vomiting. 25 suppository 0   zolpidem (AMBIEN) 5 MG tablet TAKE 1 TABLET BY MOUTH AT BEDTIME AS NEEDED FOR SLEEP 90 tablet 1    Allergies as of 10/19/2020 - Review Complete 10/19/2020  Allergen Reaction Noted   Nitrofuran derivatives  06/07/2020    Family History  Problem Relation Age of Onset   Atrial fibrillation Mother    Hypertension Father    Heart disease Father    Heart attack Father        67's   Cancer Father        non hodgkins lymphoma   Diabetes Father    Colon cancer Neg Hx    Thyroid disease Neg Hx    Esophageal cancer Neg Hx    Rectal cancer Neg Hx    Stomach cancer Neg Hx     Social History   Socioeconomic History   Marital status: Married    Spouse name: Not on file   Number of children:  Not on file   Years of education: Not on file   Highest  education level: Not on file  Occupational History   Not on file  Tobacco Use   Smoking status: Former    Types: Cigarettes    Quit date: 04/08/2016    Years since quitting: 4.5   Smokeless tobacco: Never  Vaping Use   Vaping Use: Never used  Substance and Sexual Activity   Alcohol use: Yes    Alcohol/week: 6.0 standard drinks    Types: 6 Cans of beer per week    Comment: 5 beers a week   Drug use: No   Sexual activity: Not on file  Other Topics Concern   Not on file  Social History Narrative   Married.  One daughter, 65 yo- moving back in with her.   Retired Medical illustrator.   Social Determinants of Health   Financial Resource Strain: Not on file  Food Insecurity: Not on file  Transportation Needs: Not on file  Physical Activity: Not on file  Stress: Not on file  Social Connections: Not on file  Intimate Partner Violence: Not on file    Review of Systems: Gen: Denies fever, sweats or chills. No weight loss.  CV: Denies chest pain, palpitations or edema. Resp: Denies cough, shortness of breath of hemoptysis.  GI: See HPI.   GU : Denies urinary burning, blood in urine, increased urinary frequency or incontinence. MS: Denies joint pain, muscles aches or weakness. Derm: Denies rash, itchiness, skin lesions or unhealing ulcers. Psych: Denies depression, anxiety, memory loss or confusion. Heme: Denies easy bruising, bleeding. Neuro:  Denies headaches, dizziness or paresthesias. Endo:  Denies any problems with DM, thyroid or adrenal function.  Physical Exam: Vital signs in last 24 hours: Temp:  [98.2 F (36.8 C)-100 F (37.8 C)] 98.2 F (36.8 C) (08/17 2230) Pulse Rate:  [63-132] 63 (08/18 0600) Resp:  [14-25] 20 (08/18 0600) BP: (103-171)/(58-105) 117/71 (08/18 0600) SpO2:  [92 %-100 %] 95 % (08/18 0600) Weight:  [61.2 kg] 61.2 kg (08/17 1616)   General:  Alert 65 year old female in no acute distress. Head:  Normocephalic and atraumatic. Eyes:  No scleral  icterus. Conjunctiva pink. Ears:  Normal auditory acuity. Nose:  No deformity, discharge or lesions. Mouth:  Dentition intact. No ulcers or lesions.  Neck:  Supple. No lymphadenopathy or thyromegaly.  Lungs: Breath sounds clear throughout. Heart: Regular rate and rhythm, no murmurs. Abdomen: Soft, nondistended, mild epigastric tenderness without rebound or guarding.  Positive bowel sounds all 4 quadrants. Rectal: Deferred. Musculoskeletal:  Symmetrical without gross deformities.  Pulses:  Normal pulses noted. Extremities:  Without clubbing or edema. Neurologic:  Alert and  oriented x4. No focal deficits.  Skin:  Intact without significant lesions or rashes. Psych:  Alert and cooperative. Normal mood and affect.  Intake/Output from previous day: 08/17 0701 - 08/18 0700 In: 1636.9 [IV Piggyback:1636.9] Out: -  Intake/Output this shift: No intake/output data recorded.  Lab Results: Recent Labs    10/19/20 1553 10/19/20 1642 10/20/20 0315  WBC 13.6*  --  8.6  HGB 16.5* 17.3* 13.5  HCT 47.2* 51.0* 39.3  PLT 409*  --  316   BMET Recent Labs    10/19/20 1553 10/19/20 1642 10/19/20 2325 10/20/20 0315  NA 133* 130* 135 136  K 2.8* 2.7* 3.7 3.4*  CL 93* 93* 102 103  CO2 24  --  26 27  GLUCOSE 136* 140* 110* 111*  BUN 13 11 7* 7*  CREATININE 0.78 0.70 0.50 0.47  CALCIUM 10.2  --  8.9 8.9   LFT Recent Labs    10/20/20 0315  PROT 5.6*  ALBUMIN 3.5  AST 12*  ALT 12  ALKPHOS 55  BILITOT 1.0   PT/INR Recent Labs    10/19/20 1615  LABPROT 12.6  INR 0.9   Hepatitis Panel No results for input(s): HEPBSAG, HCVAB, HEPAIGM, HEPBIGM in the last 72 hours.    Studies/Results: CT Abdomen Pelvis W Contrast  Result Date: 10/19/2020 CLINICAL DATA:  Nausea and vomiting. Vomiting while taking prep for colonoscopy. EXAM: CT ABDOMEN AND PELVIS WITH CONTRAST TECHNIQUE: Multidetector CT imaging of the abdomen and pelvis was performed using the standard protocol following  bolus administration of intravenous contrast. CONTRAST:  67m OMNIPAQUE IOHEXOL 350 MG/ML SOLN COMPARISON:  CT 10/01/2019 FINDINGS: Lower chest: Minor bandlike scarring in the left lower lobe. No acute airspace disease. No pleural effusion. Hepatobiliary: Scattered cysts throughout the liver. No suspicious liver lesion. Left hepatic lobe again wraps into the left upper quadrant, variant anatomy. Physiologically distended gallbladder. 5 mm hyperdensity in the non dependent gallbladder fundus likely represents a polyp versus less likely adherent stone. No pericholecystic fat stranding. No biliary dilatation. Pancreas: No ductal dilatation or inflammation. Spleen: Lobulated splenic contours.  Normal in size. Adrenals/Urinary Tract: Normal adrenal glands. No hydronephrosis or perinephric edema. Homogeneous renal enhancement with symmetric excretion on delayed phase imaging. Unchanged small cyst in the anterior lower right kidney. No suspicious renal lesion. No visualized renal stone. Urinary bladder is physiologically distended without wall thickening. Stomach/Bowel: Stomach is partially distended. No abnormal gastric distension. There is no small bowel inflammation or abnormal distention/obstruction. Normal appendix. Small volume of colonic stool. No colonic wall thickening or inflammation. Vascular/Lymphatic: Normal caliber abdominal aorta. Mild atherosclerosis. Patent portal vein. Circumaortic left renal vein. No acute vascular findings. No enlarged lymph nodes in the abdomen or pelvis. Reproductive: Hysterectomy.  Stable appearance of the adnexa. Other: No free air, free fluid, or intra-abdominal fluid collection. Musculoskeletal: Anterior fusion hardware at L4-L5. Near complete disc space loss at L3-L4. IMPRESSION: 1. No acute abnormality in the abdomen/pelvis. No evidence of bowel obstruction or inflammation. 2. A 5 mm hyperdensity in the gallbladder fundus likely represents a polyp versus less likely adherent  stone. No pericholecystic fat stranding. Aortic Atherosclerosis (ICD10-I70.0). Electronically Signed   By: MKeith RakeM.D.   On: 10/19/2020 19:07   DG Chest Port 1 View  Result Date: 10/19/2020 CLINICAL DATA:  Questionable sepsis - evaluate for abnormality Shortness of breath. EXAM: PORTABLE CHEST 1 VIEW COMPARISON:  04/04/2020 FINDINGS: Similar hyperinflation of prior exam. Normal heart size and mediastinal contours. No focal airspace disease. No pleural effusion or pneumothorax. No pulmonary edema. No acute osseous abnormalities are seen. IMPRESSION: Chronic hyperinflation without acute abnormality. Electronically Signed   By: MKeith RakeM.D.   On: 10/19/2020 17:46    IMPRESSION/PLAN:  188 65year old female with N/V and dehydration after taking a bowel prep  8/16 for a scheduled outpatient colonoscopy on 8/17. Normal LFTs and lipase level. She also had N/V +/- diarrhea episodes which required ER evaluations Jan and April 2022. CTAP with contrast 10/19/2020 without acute intraabdominal/pelvic inflammatory/ infectious process or bowel obstruction.  Physiologically distended gallbladder with a 541mhypodensity in the gallbladder fundus, gallbladder polyp vs adherent stone.  No biliary dilatation.  No pericholecystic fat stranding.  Scattered cysts throughout the liver, no suspicious liver lesions.   -EGD and colonoscopy tomorrow with Dr. GuLyndel SafeEGD and colonoscopy benefits  and risks discussed including risk with sedation, risk of bleeding, perforation and infection  -Phenergan 12.47m IV Q 6hrs  PRN and prior to bowel prep doses  -RUQ sonogram to further assess the gallbladder  -Continue IV fluids -KCL replacement as needed  -Clear liquid diet -Pantoprazole 460mpo QD -BMP and CBC in am -Await further recommendations per Dr .GuLyndel Safe2) Hypokalemia. K+ 2.8 -> 3.4. -KCL replacement per the hospitalist   3) History of tubular adenomatous and sessile serrated colon polyps -Colonoscopy as  ordered above  4) Leukocytosis, unclear etiology. She is afebrile.    CoNoralyn Pick8/18/2022, 09:35 AM    Attending physician's note   I have taken an interval history, reviewed the chart and examined the patient. I agree with the Advanced Practitioner's note, impression and recommendations.   Intermittent N/V with upper abdo pain. Neg CT AP 8/17, neg HIDA 07/2020 IBS-C with occ diarrhea episodes H/O polyps 07/2012.  Could not tolerate outpt prep  Plan: -RUQ [place] -EGD/colon in AM. -Continue Protonix.    I discussed EGD/Colonoscopy- the indications, risks, alternatives and potential complications including, but not limited to, bleeding, infection, reaction to medication, damage to internal organs, cardiac and/or pulmonary problems, and perforation requiring surgery (1 to 2 in 1000). The possibility that significant findings could be missed was explained. All ? were answered. The patient gives verbal consent to proceed.   RaCarmell AustriaMD LeVelora HecklerI [phone]

## 2020-10-20 NOTE — ED Notes (Signed)
Patient concerned about not taking her amlodipine BP medication tonight. Dr. Roel Cluck said to give a dose of the amlodipine tonight verbal order.

## 2020-10-21 ENCOUNTER — Observation Stay (HOSPITAL_COMMUNITY): Payer: Medicare Other | Admitting: Anesthesiology

## 2020-10-21 ENCOUNTER — Encounter (HOSPITAL_COMMUNITY)
Admission: EM | Disposition: A | Discharge status: Home / Self Care | Payer: Self-pay | Source: Home / Self Care | Attending: Family Medicine

## 2020-10-21 DIAGNOSIS — E876 Hypokalemia: Secondary | ICD-10-CM | POA: Diagnosis present

## 2020-10-21 DIAGNOSIS — Z86010 Personal history of colonic polyps: Secondary | ICD-10-CM | POA: Diagnosis not present

## 2020-10-21 DIAGNOSIS — K621 Rectal polyp: Secondary | ICD-10-CM

## 2020-10-21 DIAGNOSIS — Z90710 Acquired absence of both cervix and uterus: Secondary | ICD-10-CM | POA: Diagnosis not present

## 2020-10-21 DIAGNOSIS — K640 First degree hemorrhoids: Secondary | ICD-10-CM | POA: Diagnosis present

## 2020-10-21 DIAGNOSIS — K2100 Gastro-esophageal reflux disease with esophagitis, without bleeding: Secondary | ICD-10-CM

## 2020-10-21 DIAGNOSIS — Z79890 Hormone replacement therapy (postmenopausal): Secondary | ICD-10-CM | POA: Diagnosis not present

## 2020-10-21 DIAGNOSIS — E0590 Thyrotoxicosis, unspecified without thyrotoxic crisis or storm: Secondary | ICD-10-CM | POA: Diagnosis present

## 2020-10-21 DIAGNOSIS — E860 Dehydration: Secondary | ICD-10-CM | POA: Diagnosis present

## 2020-10-21 DIAGNOSIS — I10 Essential (primary) hypertension: Secondary | ICD-10-CM | POA: Diagnosis present

## 2020-10-21 DIAGNOSIS — Z20822 Contact with and (suspected) exposure to covid-19: Secondary | ICD-10-CM | POA: Diagnosis present

## 2020-10-21 DIAGNOSIS — Z79899 Other long term (current) drug therapy: Secondary | ICD-10-CM | POA: Diagnosis not present

## 2020-10-21 DIAGNOSIS — D72829 Elevated white blood cell count, unspecified: Secondary | ICD-10-CM | POA: Diagnosis present

## 2020-10-21 DIAGNOSIS — F411 Generalized anxiety disorder: Secondary | ICD-10-CM | POA: Diagnosis present

## 2020-10-21 DIAGNOSIS — K5730 Diverticulosis of large intestine without perforation or abscess without bleeding: Secondary | ICD-10-CM | POA: Diagnosis present

## 2020-10-21 DIAGNOSIS — K2970 Gastritis, unspecified, without bleeding: Secondary | ICD-10-CM

## 2020-10-21 DIAGNOSIS — K209 Esophagitis, unspecified without bleeding: Secondary | ICD-10-CM | POA: Diagnosis not present

## 2020-10-21 DIAGNOSIS — D124 Benign neoplasm of descending colon: Secondary | ICD-10-CM | POA: Diagnosis present

## 2020-10-21 DIAGNOSIS — K5900 Constipation, unspecified: Secondary | ICD-10-CM | POA: Diagnosis present

## 2020-10-21 DIAGNOSIS — Z888 Allergy status to other drugs, medicaments and biological substances status: Secondary | ICD-10-CM | POA: Diagnosis not present

## 2020-10-21 DIAGNOSIS — E785 Hyperlipidemia, unspecified: Secondary | ICD-10-CM | POA: Diagnosis present

## 2020-10-21 DIAGNOSIS — Z8719 Personal history of other diseases of the digestive system: Secondary | ICD-10-CM | POA: Diagnosis not present

## 2020-10-21 DIAGNOSIS — Z8249 Family history of ischemic heart disease and other diseases of the circulatory system: Secondary | ICD-10-CM | POA: Diagnosis not present

## 2020-10-21 DIAGNOSIS — R112 Nausea with vomiting, unspecified: Secondary | ICD-10-CM | POA: Diagnosis present

## 2020-10-21 DIAGNOSIS — G8929 Other chronic pain: Secondary | ICD-10-CM | POA: Diagnosis present

## 2020-10-21 HISTORY — PX: POLYPECTOMY: SHX5525

## 2020-10-21 HISTORY — PX: ESOPHAGOGASTRODUODENOSCOPY (EGD) WITH PROPOFOL: SHX5813

## 2020-10-21 HISTORY — PX: COLONOSCOPY WITH PROPOFOL: SHX5780

## 2020-10-21 HISTORY — PX: BIOPSY: SHX5522

## 2020-10-21 LAB — CBC
HCT: 43.9 % (ref 36.0–46.0)
Hemoglobin: 14.4 g/dL (ref 12.0–15.0)
MCH: 31.1 pg (ref 26.0–34.0)
MCHC: 32.8 g/dL (ref 30.0–36.0)
MCV: 94.8 fL (ref 80.0–100.0)
Platelets: 361 10*3/uL (ref 150–400)
RBC: 4.63 MIL/uL (ref 3.87–5.11)
RDW: 14.5 % (ref 11.5–15.5)
WBC: 9.4 10*3/uL (ref 4.0–10.5)
nRBC: 0 % (ref 0.0–0.2)

## 2020-10-21 LAB — BASIC METABOLIC PANEL
Anion gap: 10 (ref 5–15)
BUN: 6 mg/dL — ABNORMAL LOW (ref 8–23)
CO2: 22 mmol/L (ref 22–32)
Calcium: 9.6 mg/dL (ref 8.9–10.3)
Chloride: 106 mmol/L (ref 98–111)
Creatinine, Ser: 0.37 mg/dL — ABNORMAL LOW (ref 0.44–1.00)
GFR, Estimated: >60 mL/min (ref >60)
Glucose, Bld: 85 mg/dL (ref 70–99)
Potassium: 3.5 mmol/L (ref 3.5–5.1)
Sodium: 138 mmol/L (ref 135–145)

## 2020-10-21 LAB — T3: T3, Total: 93 ng/dL (ref 71–180)

## 2020-10-21 SURGERY — ESOPHAGOGASTRODUODENOSCOPY (EGD) WITH PROPOFOL
Anesthesia: Monitor Anesthesia Care

## 2020-10-21 MED ORDER — PANTOPRAZOLE SODIUM 40 MG PO TBEC
40.0000 mg | DELAYED_RELEASE_TABLET | Freq: Every day | ORAL | Status: DC
  Administered 2020-10-22: 40 mg via ORAL
  Filled 2020-10-21: qty 1

## 2020-10-21 MED ORDER — PROPOFOL 500 MG/50ML IV EMUL
INTRAVENOUS | Status: DC | PRN
  Administered 2020-10-21: 125 ug/kg/min via INTRAVENOUS

## 2020-10-21 MED ORDER — LACTATED RINGERS IV SOLN: INTRAVENOUS | Status: DC

## 2020-10-21 MED ORDER — LIDOCAINE HCL 1 % IJ SOLN
INTRAMUSCULAR | Status: DC | PRN
  Administered 2020-10-21: 60 mg via INTRADERMAL

## 2020-10-21 MED ORDER — METHIMAZOLE 5 MG PO TABS
5.0000 mg | ORAL_TABLET | Freq: Once | ORAL | Status: AC
  Administered 2020-10-21: 5 mg via ORAL
  Filled 2020-10-21: qty 1

## 2020-10-21 MED ORDER — PROPOFOL 1000 MG/100ML IV EMUL
INTRAVENOUS | Status: AC
  Filled 2020-10-21: qty 100

## 2020-10-21 MED ORDER — PROPOFOL 10 MG/ML IV BOLUS
INTRAVENOUS | Status: DC | PRN
  Administered 2020-10-21: 10 mg via INTRAVENOUS
  Administered 2020-10-21: 20 mg via INTRAVENOUS
  Administered 2020-10-21 (×6): 10 mg via INTRAVENOUS

## 2020-10-21 SURGICAL SUPPLY — 25 items

## 2020-10-21 NOTE — Progress Notes (Signed)
PROGRESS NOTE    Anna Moore  [id number] DOB: December 04, 1955 DOA: 10/19/2020 PCP: Haywood Pao, MD   Brief Narrative:  Anna Moore is a 65 y.o. female with medical history significant of hyperthyroidism, parotid mass sp resection, HTN presented to ED with concerns of dehydration, rapid heart rate, nausea and vomiting.  Patient was in the process of colonoscopy prep and continued to have diarrhea.  Due to her concerns mentioned above, she came to the emergency department.  Upon arrival to ED, she was slightly tachycardic with blood pressure elevated.  Subsequently she was admitted due to nausea vomiting and diarrhea, dehydration as well as hypokalemia.  Assessment & Plan:   Active Problems:   Hypertension   Hyperthyroidism   Hyperlipidemia   Nausea vomiting and diarrhea   Dehydration   Hypokalemia  Nausea/vomiting/diarrhea/dehydration: No more vomiting or diarrhea.  Still feels weak.  Prefers to stay here and see how she does with the regular diet by tomorrow after she has her EGD and colonoscopy today.  Colon cancer screening: Patient was scheduled to have colonoscopy as outpatient.  GI has seen her here.  GI on board and they have scheduled her for EGD and colonoscopy today.  Hypothyroidism: Continue Synthroid.  Essential hypertension: Controlled.  Continue Norvasc and continue to hold hydrochlorothiazide.  Hypokalemia: Resolved.  DVT prophylaxis: SCDs Start: 10/20/20 0028   Code Status: Full Code  Family Communication:  None present at bedside.  Plan of care discussed with patient in length and he verbalized understanding and agreed with it.  Status is: Observation  The patient will require care spanning > 2 midnights and should be moved to inpatient because: Ongoing diagnostic testing needed not appropriate for outpatient work up  Dispo: The patient is from: Home              Anticipated d/c is to: Home              Patient currently is not medically stable  to d/c.   Difficult to place patient No        Estimated body mass index is 22.29 kg/m as calculated from the following:   Height as of this encounter: 5' 5.25" (1.657 m).   Weight as of this encounter: 61.2 kg.     Nutritional Assessment: Body mass index is 22.29 kg/m.Marland Kitchen Seen by dietician.  I agree with the assessment and plan as outlined below: Nutrition Status:        .  Skin Assessment: I have examined the patient's skin and I agree with the wound assessment as performed by the wound care RN as outlined below:    Consultants:  GI  Procedures:  None  Antimicrobials:  Anti-infectives (From admission, onward)    None          Subjective: Seen and examined.  She has no complaints.  No nausea or vomiting.  Objective: Vitals:   10/20/20 1530 10/20/20 1546 10/20/20 2034 10/21/20 0459  BP: (!) 105/94 (!) 147/71 (!) 161/77 133/78  Pulse: 70 72 84 82  Resp: '16 17 20 20  '$ Temp: 98.5 F (36.9 C) 98.2 F (36.8 C) 98.6 F (37 C) 97.6 F (36.4 C)  TempSrc: Oral Oral Oral Oral  SpO2: 99% 99% 98% 98%  Weight:      Height:        Intake/Output Summary (Last 24 hours) at 10/21/2020 1045 Last data filed at 10/21/2020 0900 Gross per 24 hour  Intake 0 ml  Output --  Net 0  ml    Filed Weights   10/19/20 1616  Weight: 61.2 kg    Examination:  General exam: Appears calm and comfortable  Respiratory system: Clear to auscultation. Respiratory effort normal. Cardiovascular system: S1 & S2 heard, RRR. No JVD, murmurs, rubs, gallops or clicks. No pedal edema. Gastrointestinal system: Abdomen is nondistended, soft and nontender. No organomegaly or masses felt. Normal bowel sounds heard. Central nervous system: Alert and oriented. No focal neurological deficits. Extremities: Symmetric 5 x 5 power. Skin: No rashes, lesions or ulcers.  Psychiatry: Judgement and insight appear normal. Mood & affect appropriate.    Data Reviewed: I have personally reviewed  following labs and imaging studies  CBC: Recent Labs  Lab 10/19/20 1553 10/19/20 1642 10/20/20 0315 10/21/20 0532  WBC 13.6*  --  8.6 9.4  NEUTROABS 10.2*  --  5.2  --   HGB 16.5* 17.3* 13.5 14.4  HCT 47.2* 51.0* 39.3 43.9  MCV 88.7  --  91.0 94.8  PLT 409*  --  316 [id number]    Basic Metabolic Panel: Recent Labs  Lab 10/19/20 1553 10/19/20 1642 10/19/20 2325 10/20/20 0315 10/21/20 0532  NA 133* 130* 135 136 138  K 2.8* 2.7* 3.7 3.4* 3.5  CL 93* 93* 102 103 106  CO2 24  --  '26 27 22  '$ GLUCOSE 136* 140* 110* 111* 85  BUN 13 11 7* 7* 6*  CREATININE 0.78 0.70 0.50 0.47 0.37*  CALCIUM 10.2  --  8.9 8.9 9.6  MG 1.8  --   --  2.0  --   PHOS  --   --  1.9* 2.2*  --     GFR: Estimated Creatinine Clearance: 63.8 mL/min (A) (by C-G formula based on SCr of 0.37 mg/dL (L)). Liver Function Tests: Recent Labs  Lab 10/19/20 1553 10/20/20 0315  AST 18 12*  ALT 16 12  ALKPHOS 76 55  BILITOT 1.1 1.0  PROT 7.7 5.6*  ALBUMIN 4.7 3.5    Recent Labs  Lab 10/19/20 1553  LIPASE 38    No results for input(s): AMMONIA in the last 168 hours. Coagulation Profile: Recent Labs  Lab 10/19/20 1615  INR 0.9    Cardiac Enzymes: Recent Labs  Lab 10/19/20 2330  CKTOTAL 45    BNP (last 3 results) No results for input(s): PROBNP in the last 8760 hours. HbA1C: No results for input(s): HGBA1C in the last 72 hours. CBG: No results for input(s): GLUCAP in the last 168 hours. Lipid Profile: No results for input(s): CHOL, HDL, LDLCALC, TRIG, CHOLHDL, LDLDIRECT in the last 72 hours. Thyroid Function Tests: Recent Labs    10/19/20 2325 10/20/20 0315  TSH  --  0.456  FREET4 1.12  --     Anemia Panel: No results for input(s): VITAMINB12, FOLATE, FERRITIN, TIBC, IRON, RETICCTPCT in the last 72 hours. Sepsis Labs: Recent Labs  Lab 10/19/20 1615  LATICACIDVEN 1.6     Recent Results (from the past 240 hour(s))  Resp Panel by RT-PCR (Flu A&B, Covid) Nasopharyngeal Swab      Status: None   Collection Time: 10/19/20  4:16 PM   Specimen: Nasopharyngeal Swab; Nasopharyngeal(NP) swabs in vial transport medium  Result Value Ref Range Status   SARS Coronavirus 2 by RT PCR NEGATIVE NEGATIVE Final    Comment: (NOTE) SARS-CoV-2 target nucleic acids are NOT DETECTED.  The SARS-CoV-2 RNA is generally detectable in upper respiratory specimens during the acute phase of infection. The lowest concentration of SARS-CoV-2 viral copies this assay  can detect is 138 copies/mL. A negative result does not preclude SARS-Cov-2 infection and should not be used as the sole basis for treatment or other patient management decisions. A negative result may occur with  improper specimen collection/handling, submission of specimen other than nasopharyngeal swab, presence of viral mutation(s) within the areas targeted by this assay, and inadequate number of viral copies(<138 copies/mL). A negative result must be combined with clinical observations, patient history, and epidemiological information. The expected result is Negative.  Fact Sheet for Patients:  EntrepreneurPulse.com.au  Fact Sheet for Healthcare Providers:  IncredibleEmployment.be  This test is no t yet approved or cleared by the [place] FDA and  has been authorized for detection and/or diagnosis of SARS-CoV-2 by FDA under an Emergency Use Authorization (EUA). This EUA will remain  in effect (meaning this test can be used) for the duration of the COVID-19 declaration under Section 564(b)(1) of the Act, 21 U.S.C.section 360bbb-3(b)(1), unless the authorization is terminated  or revoked sooner.       Influenza A by PCR NEGATIVE NEGATIVE Final   Influenza B by PCR NEGATIVE NEGATIVE Final    Comment: (NOTE) The Xpert Xpress SARS-CoV-2/FLU/RSV plus assay is intended as an aid in the diagnosis of influenza from Nasopharyngeal swab specimens and should not be used as a sole basis  for treatment. Nasal washings and aspirates are unacceptable for Xpert Xpress SARS-CoV-2/FLU/RSV testing.  Fact Sheet for Patients: EntrepreneurPulse.com.au  Fact Sheet for Healthcare Providers: IncredibleEmployment.be  This test is not yet approved or cleared by the [place] FDA and has been authorized for detection and/or diagnosis of SARS-CoV-2 by FDA under an Emergency Use Authorization (EUA). This EUA will remain in effect (meaning this test can be used) for the duration of the COVID-19 declaration under Section 564(b)(1) of the Act, 21 U.S.C. section 360bbb-3(b)(1), unless the authorization is terminated or revoked.  Performed at [facility], [place] [address], [place], [place] [address]   Blood culture (routine x 2)     Status: None (Preliminary result)   Collection Time: 10/19/20  4:21 PM   Specimen: BLOOD LEFT FOREARM  Result Value Ref Range Status   Specimen Description   Final    BLOOD LEFT FOREARM Performed at [facility] [address] [place], [place] [address]    Special Requests   Final    BOTTLES DRAWN AEROBIC AND ANAEROBIC Blood Culture adequate volume Performed at [facility] [address], [place], [place] [address]    Culture   Final    NO GROWTH 2 DAYS Performed at [facility] [address], [place], [place] [address]    Report Status PENDING  Incomplete  Blood culture (routine x 2)     Status: None (Preliminary result)   Collection Time: 10/19/20  4:22 PM   Specimen: BLOOD  Result Value Ref Range Status   Specimen Description   Final    BLOOD RIGHT WRIST Performed at [facility] [address], [place], [place] [address]    Special Requests   Final    BOTTLES DRAWN AEROBIC AND ANAEROBIC Blood Culture adequate volume Performed at [facility] [address], [place], [place] [address]    Culture    Final    NO GROWTH 2 DAYS Performed at [facility] [address], [place], [place] [address]    Report Status PENDING  Incomplete       Radiology Studies: CT  Abdomen Pelvis W Contrast  Result Date: 10/19/2020 CLINICAL DATA:  Nausea and vomiting. Vomiting while taking prep for colonoscopy. EXAM: CT ABDOMEN AND PELVIS WITH CONTRAST TECHNIQUE: Multidetector CT imaging of the abdomen and pelvis was performed using the standard protocol following bolus administration of intravenous contrast. CONTRAST:  15m OMNIPAQUE IOHEXOL 350 MG/ML SOLN COMPARISON:  CT 10/01/2019 FINDINGS: Lower chest: Minor bandlike scarring in the left lower lobe. No acute airspace disease. No pleural effusion. Hepatobiliary: Scattered cysts throughout the liver. No suspicious liver lesion. Left hepatic lobe again wraps into the left upper quadrant, variant anatomy. Physiologically distended gallbladder. 5 mm hyperdensity in the non dependent gallbladder fundus likely represents a polyp versus less likely adherent stone. No pericholecystic fat stranding. No biliary dilatation. Pancreas: No ductal dilatation or inflammation. Spleen: Lobulated splenic contours.  Normal in size. Adrenals/Urinary Tract: Normal adrenal glands. No hydronephrosis or perinephric edema. Homogeneous renal enhancement with symmetric excretion on delayed phase imaging. Unchanged small cyst in the anterior lower right kidney. No suspicious renal lesion. No visualized renal stone. Urinary bladder is physiologically distended without wall thickening. Stomach/Bowel: Stomach is partially distended. No abnormal gastric distension. There is no small bowel inflammation or abnormal distention/obstruction. Normal appendix. Small volume of colonic stool. No colonic wall thickening or inflammation. Vascular/Lymphatic: Normal caliber abdominal aorta. Mild atherosclerosis. Patent portal vein. Circumaortic left renal vein. No acute vascular findings. No enlarged lymph  nodes in the abdomen or pelvis. Reproductive: Hysterectomy.  Stable appearance of the adnexa. Other: No free air, free fluid, or intra-abdominal fluid collection. Musculoskeletal: Anterior fusion hardware at L4-L5. Near complete disc space loss at L3-L4. IMPRESSION: 1. No acute abnormality in the abdomen/pelvis. No evidence of bowel obstruction or inflammation. 2. A 5 mm hyperdensity in the gallbladder fundus likely represents a polyp versus less likely adherent stone. No pericholecystic fat stranding. Aortic Atherosclerosis (ICD10-I70.0). Electronically Signed   By: MKeith RakeM.D.   On: 10/19/2020 19:07   DG Chest Port 1 View  Result Date: 10/19/2020 CLINICAL DATA:  Questionable sepsis - evaluate for abnormality Shortness of breath. EXAM: PORTABLE CHEST 1 VIEW COMPARISON:  04/04/2020 FINDINGS: Similar hyperinflation of prior exam. Normal heart size and mediastinal contours. No focal airspace disease. No pleural effusion or pneumothorax. No pulmonary edema. No acute osseous abnormalities are seen. IMPRESSION: Chronic hyperinflation without acute abnormality. Electronically Signed   By: MKeith RakeM.D.   On: 10/19/2020 17:46   [place]Abdomen Limited RUQ (LIVER/GB)  Result Date: 10/20/2020 CLINICAL DATA:  Nausea, vomiting, CT abdomen/pelvis showed gallbladder polyps EXAM: ULTRASOUND ABDOMEN LIMITED RIGHT UPPER QUADRANT COMPARISON:  CT abdomen/pelvis 1 day prior FINDINGS: Gallbladder: Multiple gallbladder polyps are seen adherent to the gallbladder wall measuring up to 7 mm. There is no gallbladder wall thickening or pericholecystic fluid. There was no sonographic Murphy sign. Common bile duct: Diameter: 8 mm Liver: No focal lesion identified. Within normal limits in parenchymal echogenicity. Portal vein is patent on color Doppler imaging with normal direction of blood flow towards the liver. Other: None. IMPRESSION: 1. Multiple gallbladder polyps measuring up to 7 mm. Recommend follow up ultrasound in  1 year to reassess. 2. Mildly dilated common bile duct measuring up to 867m no obstructing lesion is seen. Correlate with LFT's, and MRI abdomen with MRCP may be considered for further evaluation if indicated. Electronically Signed   By: PeValetta Mole.D.   On: 10/20/2020 11:49    Scheduled Meds:  amLODipine  10 mg Oral QHS   estradiol  1 mg Oral BID  Continuous Infusions:  lactated ringers 75 mL/hr at 10/21/20 0356   promethazine (PHENERGAN) injection (IM or IVPB) Stopped (10/19/20 1959)     LOS: 0 days   Time spent: 27 minutes   Darliss Cheney, MD [facility]  10/21/2020, 10:45 AM   How to contact the [facility] Attending or Consulting provider [place] or covering provider during after hours [place], for this patient?  Check the care team in [employer] and look for a) attending/consulting [facility] provider listed and b) the [facility] team listed. Page or secure chat [phone]. Log into [email or web address] and use [facility] universal password to access. If you do not have the password, please contact the hospital operator. Locate the [facility] provider you are looking for under [facility] and page to a number that you can be directly reached. If you still have difficulty reaching the provider, please page the [facility] (Director on Call) for the Hospitalists listed on amion for assistance.

## 2020-10-21 NOTE — Interval H&P Note (Signed)
History and Physical Interval Note:  10/21/2020 2:10 PM  Anna Moore  has presented today for surgery, with the diagnosis of colon polyps, nausea/vomiting, upper abdomial pain.  The various methods of treatment have been discussed with the patient and family. After consideration of risks, benefits and other options for treatment, the patient has consented to  Procedure(s): ESOPHAGOGASTRODUODENOSCOPY (EGD) WITH PROPOFOL (N/A) COLONOSCOPY WITH PROPOFOL (N/A) as a surgical intervention.  The patient's history has been reviewed, patient examined, no change in status, stable for surgery.  I have reviewed the patient's chart and labs.  Questions were answered to the patient's satisfaction.     Jackquline Denmark

## 2020-10-21 NOTE — Anesthesia Preprocedure Evaluation (Addendum)
Anesthesia Evaluation  Patient identified by MRN, date of birth, ID band Patient awake    Reviewed: Allergy & Precautions, NPO status , Patient's Chart, lab work & pertinent test results  Airway Mallampati: II  TM Distance: >3 FB Neck ROM: Full    Dental  (+) Teeth Intact   Pulmonary neg pulmonary ROS, former smoker,    Pulmonary exam normal        Cardiovascular hypertension, Pt. on medications  Rhythm:Regular Rate:Normal     Neuro/Psych Anxiety negative neurological ROS     GI/Hepatic Neg liver ROS, N/V, colon polyps    Endo/Other  Hyperthyroidism   Renal/GU negative Renal ROS  negative genitourinary   Musculoskeletal  (+) Arthritis , Osteoarthritis,    Abdominal (+)  Abdomen: soft. Bowel sounds: normal.  Peds  Hematology negative hematology ROS (+)   Anesthesia Other Findings   Reproductive/Obstetrics                            Anesthesia Physical Anesthesia Plan  ASA: 2  Anesthesia Plan: MAC   Post-op Pain Management:    Induction: Intravenous  PONV Risk Score and Plan: 2 and Propofol infusion and Treatment may vary due to age or medical condition  Airway Management Planned: Simple Face Mask, Natural Airway and Nasal Cannula  Additional Equipment: None  Intra-op Plan:   Post-operative Plan:   Informed Consent: I have reviewed the patients History and Physical, chart, labs and discussed the procedure including the risks, benefits and alternatives for the proposed anesthesia with the patient or authorized representative who has indicated his/her understanding and acceptance.     Dental advisory given  Plan Discussed with: CRNA  Anesthesia Plan Comments: (Lab Results      Component                Value               Date                      WBC                      9.4                 10/21/2020                HGB                      14.4                 10/21/2020                HCT                      43.9                10/21/2020                MCV                      94.8                10/21/2020                PLT                      361  10/21/2020           Lab Results      Component                Value               Date                      NA                       138                 10/21/2020                K                        3.5                 10/21/2020                CO2                      22                  10/21/2020                GLUCOSE                  85                  10/21/2020                BUN                      6 (L)               10/21/2020                CREATININE               0.37 (L)            10/21/2020                CALCIUM                  9.6                 10/21/2020                GFRNONAA                 >60                 10/21/2020                GFRAA                    >60                 10/08/2016          )       Anesthesia Quick Evaluation

## 2020-10-21 NOTE — Op Note (Addendum)
[facility] Patient Name: Anna Moore Procedure Date: 10/21/2020 MRN: [id number] Attending MD: Jackquline Denmark , MD Date of Birth: Oct 07, 1955 CSN: [id number] Age: 65 Admit Type: Inpatient Procedure:                Colonoscopy Indications:              High risk colon cancer surveillance: Personal                            history of colonic polyps Providers:                Jackquline Denmark, MD, Carlyn Reichert, RN, Benetta Spar, Technician Referring MD:              Medicines:                Monitored Anesthesia Care Complications:            No immediate complications. Estimated Blood Loss:     Estimated blood loss: none. Procedure:                Pre-Anesthesia Assessment:                           - Prior to the procedure, a History and Physical                            was performed, and patient medications and                            allergies were reviewed. The patient's tolerance of                            previous anesthesia was also reviewed. The risks                            and benefits of the procedure and the sedation                            options and risks were discussed with the patient.                            All questions were answered, and informed consent                            was obtained. Prior Anticoagulants: The patient has                            taken no previous anticoagulant or antiplatelet                            agents. ASA Grade Assessment: II - A patient with                            mild systemic  disease. After reviewing the risks                            and benefits, the patient was deemed in                            satisfactory condition to undergo the procedure.                           - Prior to the procedure, a History and Physical                            was performed, and patient medications and                            allergies were reviewed. The patient's  tolerance of                            previous anesthesia was also reviewed. The risks                            and benefits of the procedure and the sedation                            options and risks were discussed with the patient.                            All questions were answered, and informed consent                            was obtained. Prior Anticoagulants: The patient has                            taken no previous anticoagulant or antiplatelet                            agents. ASA Grade Assessment: II - A patient with                            mild systemic disease. After reviewing the risks                            and benefits, the patient was deemed in                            satisfactory condition to undergo the procedure.                           After obtaining informed consent, the colonoscope                            was passed under direct vision. Throughout the  procedure, the patient's blood pressure, pulse, and                            oxygen saturations were monitored continuously. The                            [id number]) Olympus colonoscope was                            introduced through the anus and advanced to the 2                            cm into the ileum. The colonoscopy was performed                            without difficulty. The patient tolerated the                            procedure well. The quality of the bowel                            preparation was good. The terminal ileum, ileocecal                            valve, appendiceal orifice, and rectum were                            photographed. Scope In: 3:09:36 PM Scope Out: 3:35:54 PM Scope Withdrawal Time: 0 hours 22 minutes 41 seconds  Total Procedure Duration: 0 hours 26 minutes 18 seconds  Findings:      Five sessile polyps were found in the rectum, proximal sigmoid colon,       mid sigmoid colon and descending colon.  The polyps were 4 to 6 mm in       size. These polyps were removed with a cold snare. Resection and       retrieval were complete.      A few rare small-mouthed diverticula were found in the sigmoid colon.      Non-bleeding internal hemorrhoids were found during retroflexion. The       hemorrhoids were small and Grade I (internal hemorrhoids that do not       prolapse).      The terminal ileum appeared normal.      The exam was otherwise without abnormality on direct and retroflexion       views. Impression:               - Five 4 to 6 mm polyps in the rectum, in the                            proximal sigmoid colon, in the mid sigmoid colon                            and in the descending colon, removed with a cold  snare. Resected and retrieved.                           - Minimal sigmoid diverticulosis.                           - Non-bleeding internal hemorrhoids.                           - The examined portion of the ileum was normal.                           - The examination was otherwise normal on direct                            and retroflexion views. Moderate Sedation:      Not Applicable - Patient had care per Anesthesia. Recommendation:           - Patient has a contact number available for                            emergencies. The signs and symptoms of potential                            delayed complications were discussed with the                            patient. Return to normal activities tomorrow.                            Written discharge instructions were provided to the                            patient.                           - High fiber diet.                           - Continue present medications.                           - Miralax 1 capful (17 grams) in 8 ounces of water                            PO daily for symptomatic management of constipation.                           - Await pathology results.                            - Repeat colonoscopy for surveillance based on                            pathology results.                           -  The findings and recommendations were discussed                            with the patient's family.                           - Will sign off for now.                           - She should FU in GI clinic in 6-8 weeks. Earlier,                            if still with problems. Procedure Code(s):        --- Professional ---                           [phone], Colonoscopy, flexible; with removal of                            tumor(s), polyp(s), or other lesion(s) by snare                            technique Diagnosis Code(s):        --- Professional ---                           Z86.010, Personal history of colonic polyps                           K62.1, Rectal polyp                           K63.5, Polyp of colon                           K64.0, First degree hemorrhoids                           K57.30, Diverticulosis of large intestine without                            perforation or abscess without bleeding CPT copyright 2019 [employer]. All rights reserved. The codes documented in this report are preliminary and upon coder review may  be revised to meet current compliance requirements. Jackquline Denmark, MD 10/21/2020 3:39:06 PM This report has been signed electronically. Number of Addenda: 0

## 2020-10-21 NOTE — Transfer of Care (Signed)
Immediate Anesthesia Transfer of Care Note  Patient: Anna Moore  Procedure(s) Performed: ESOPHAGOGASTRODUODENOSCOPY (EGD) WITH PROPOFOL COLONOSCOPY WITH PROPOFOL BIOPSY POLYPECTOMY  Patient Location: PACU and Endoscopy Unit  Anesthesia Type:MAC  Level of Consciousness: awake, alert , oriented and patient cooperative  Airway & Oxygen Therapy: Patient Spontanous Breathing and Patient connected to face mask oxygen  Post-op Assessment: Report given to RN and Post -op Vital signs reviewed and stable  Post vital signs: Reviewed and stable  Last Vitals:  Vitals Value Taken Time  BP 140/97 10/21/20 1541  Temp 36.6 C 10/21/20 1541  Pulse 94 10/21/20 1546  Resp 21 10/21/20 1546  SpO2 100 % 10/21/20 1546  Vitals shown include unvalidated device data.  Last Pain:  Vitals:   10/21/20 1541  TempSrc: Oral  PainSc: 0-No pain         Complications: No notable events documented.

## 2020-10-21 NOTE — Op Note (Signed)
[facility] Patient Name: Anna Moore Procedure Date: 10/21/2020 MRN: [id number] Attending MD: Jackquline Denmark , MD Date of Birth: Nov 23, 1955 CSN: [id number] Age: 65 Admit Type: Inpatient Procedure:                Upper GI endoscopy Indications:              Epigastric abdominal pain with intermittent                            nausea/vomiting and heartburn. Providers:                Jackquline Denmark, MD, Carlyn Reichert, RN, Benetta Spar, Technician Referring MD:              Medicines:                Monitored Anesthesia Care Complications:            No immediate complications. Estimated Blood Loss:     Estimated blood loss: none. Procedure:                Pre-Anesthesia Assessment:                           - Prior to the procedure, a History and Physical                            was performed, and patient medications and                            allergies were reviewed. The patient's tolerance of                            previous anesthesia was also reviewed. The risks                            and benefits of the procedure and the sedation                            options and risks were discussed with the patient.                            All questions were answered, and informed consent                            was obtained. Prior Anticoagulants: The patient has                            taken no previous anticoagulant or antiplatelet                            agents. ASA Grade Assessment: II - A patient with                            mild systemic  disease. After reviewing the risks                            and benefits, the patient was deemed in                            satisfactory condition to undergo the procedure.                           After obtaining informed consent, the endoscope was                            passed under direct vision. Throughout the                            procedure, the patient's  blood pressure, pulse, and                            oxygen saturations were monitored continuously. The                            [id number] Olympus endoscope was introduced                            through the mouth, and advanced to the second part                            of duodenum. The upper GI endoscopy was                            accomplished without difficulty. The patient                            tolerated the procedure well. Scope In: Scope Out: Findings:      LA Grade A (one or more mucosal breaks less than 5 mm, not extending       between tops of 2 mucosal folds) esophagitis with no bleeding was found       37 to 38 cm from the incisors. Biopsies were taken with a cold forceps       for histology.      Localized minimal inflammation characterized by erythema was found in       the gastric antrum. Biopsies were taken with a cold forceps for       histology.      The examined duodenum was normal. Biopsies for histology were taken with       a cold forceps for evaluation of celiac disease. Impression:               - LA Grade A reflux esophagitis with no bleeding.                            Biopsied.                           - Gastritis. Biopsied.                           -  Normal examined duodenum. Biopsied. Moderate Sedation:      Not Applicable - Patient had care per Anesthesia. Recommendation:           - Patient has a contact number available for                            emergencies. The signs and symptoms of potential                            delayed complications were discussed with the                            patient. Return to normal activities tomorrow.                            Written discharge instructions were provided to the                            patient.                           - Resume previous diet.                           - Continue present medications including Protonix                            40 mg p.o. once a  day                           - Avoid nonsteroidals.                           - The findings and recommendations were discussed                            with the patient's family. Procedure Code(s):        --- Professional ---                           [phone], Esophagogastroduodenoscopy, flexible,                            transoral; with biopsy, single or multiple Diagnosis Code(s):        --- Professional ---                           K21.00, Gastro-esophageal reflux disease with                            esophagitis, without bleeding                           K29.70, Gastritis, unspecified, without bleeding                           R10.13, Epigastric pain CPT copyright 2019 American  Medical Association. All rights reserved. The codes documented in this report are preliminary and upon coder review may  be revised to meet current compliance requirements. Jackquline Denmark, MD 10/21/2020 3:07:47 PM This report has been signed electronically. Number of Addenda: 0

## 2020-10-21 NOTE — Anesthesia Postprocedure Evaluation (Signed)
Anesthesia Post Note  Patient: Kytzia Gienger  Procedure(s) Performed: ESOPHAGOGASTRODUODENOSCOPY (EGD) WITH PROPOFOL COLONOSCOPY WITH PROPOFOL BIOPSY POLYPECTOMY     Patient location during evaluation: PACU Anesthesia Type: MAC Level of consciousness: awake and alert and oriented Pain management: pain level controlled Vital Signs Assessment: post-procedure vital signs reviewed and stable Respiratory status: spontaneous breathing, nonlabored ventilation and respiratory function stable Cardiovascular status: blood pressure returned to baseline Postop Assessment: no apparent nausea or vomiting Anesthetic complications: no   No notable events documented.  Last Vitals:  Vitals:   10/21/20 1550 10/21/20 1600  BP: 139/78 138/70  Pulse: 89 73  Resp: (!) 30 16  Temp:    SpO2: 98% 99%    Last Pain:  Vitals:   10/21/20 1600  TempSrc:   PainSc: 0-No pain                 Marthenia Rolling

## 2020-10-22 DIAGNOSIS — K209 Esophagitis, unspecified without bleeding: Secondary | ICD-10-CM

## 2020-10-22 DIAGNOSIS — K2100 Gastro-esophageal reflux disease with esophagitis, without bleeding: Secondary | ICD-10-CM

## 2020-10-22 DIAGNOSIS — K2970 Gastritis, unspecified, without bleeding: Secondary | ICD-10-CM

## 2020-10-22 MED ORDER — OMEPRAZOLE 40 MG PO CPDR: 40.0000 mg | DELAYED_RELEASE_CAPSULE | Freq: Every day | ORAL | 0 refills | Status: DC

## 2020-10-22 NOTE — Progress Notes (Signed)
Patient will be discharging home with family. Belongings will be returned. Education on medication will be provided.

## 2020-10-22 NOTE — Discharge Summary (Signed)
Physician Discharge Summary  Anna Moore [id number] DOB: Jul 09, 1955 DOA: 10/19/2020  PCP: Haywood Pao, MD  Admit date: 10/19/2020 Discharge date: 10/22/2020 30 Day Unplanned Readmission Risk Score    Flowsheet Row ED to Hosp-Admission (Current) from 10/19/2020 in [facility]  30 Day Unplanned Readmission Risk Score (%) 10.93 Filed at 10/22/2020 0800       This score is the patient's risk of an unplanned readmission within 30 days of being discharged (0 -100%). The score is based on dignosis, age, lab data, medications, orders, and past utilization.   Low:  0-14.9   Medium: 15-21.9   High: 22-29.9   Extreme: 30 and above          Admitted From: Home Disposition: Home  Recommendations for Outpatient Follow-up:  Follow up with PCP in 1-2 weeks Please obtain BMP/CBC in one week Follow-up with GI in 1 to 2 weeks Please follow up with your PCP on the following pending results: Unresulted Labs (From admission, onward)    None         Home Health: None Equipment/Devices: None  Discharge Condition: Stable CODE STATUS: Full code Diet recommendation: Cardiac  Subjective: Seen and examined.  Feels well.  No nausea vomiting.  Tolerated diet.  Ready to go home.  Brief/Interim Summary: Anna Moore is a 65 y.o. female with medical history significant of hyperthyroidism, parotid mass sp resection, HTN presented to ED with concerns of dehydration, rapid heart rate, nausea and vomiting.  Patient was in the process of colonoscopy prep and continued to have diarrhea.  Due to her concerns mentioned above, she came to the emergency department.  Upon arrival to ED, she was slightly tachycardic with blood pressure elevated.  Subsequently she was admitted due to nausea vomiting and diarrhea, dehydration as well as hypokalemia.  Electrolytes were replaced, she was hydrated, antiemetics were provided.  Symptoms improved.  GI was consulted.   She underwent EGD and colonoscopy.  EGD showed LA grade a reflux esophagitis with no bleeding, biopsy taken, gastritis, biopsy taken.  Normal duodenum.  Colonoscopy showed five 4 to 6 mm polyps in the rectum in the proximal sigmoid colon, in the mid sigmoid colon and in the descending colon they were removed and retrieved.  Sigmoid diverticulosis but no diverticulitis.  Nonbleeding internal hemorrhoids.  GI recommended discharging on omeprazole.  Patient was cleared for discharge.  She has no symptoms and tolerated diet well postprocedure and she is being discharged home on 1 month of omeprazole.  She will follow-up with GI for biopsy results and further dosage of omeprazole if needed.  Discharge Diagnoses:  Active Problems:   Hypertension   Hyperthyroidism   Hyperlipidemia   Nausea vomiting and diarrhea   Dehydration   Hypokalemia   Nausea & vomiting   Adenomatous polyp of descending colon    Discharge Instructions   Allergies as of 10/22/2020       Reactions   Nitrofuran Derivatives    Became very sick        Medication List     TAKE these medications    ALPRAZolam 0.25 MG tablet Commonly known as: XANAX Take 1 tablet (0.25 mg total) by mouth daily as needed. for anxiety What changed:  reasons to take this additional instructions   amLODipine 10 MG tablet Commonly known as: NORVASC Take 1 tablet by mouth once daily   estradiol 1 MG tablet Commonly known as: ESTRACE Take 1 tablet by mouth twice daily  gabapentin 300 MG capsule Commonly known as: NEURONTIN Take 1 capsule by mouth at bedtime as needed (pain).   hydrochlorothiazide 25 MG tablet Commonly known as: HYDRODIURIL Take 1 tablet by mouth once daily   magnesium oxide 400 MG tablet Commonly known as: MAG-OX Take 1 tablet (400 mg total) by mouth daily.   methimazole 5 MG tablet Commonly known as: TAPAZOLE TAKE 1 TABLET BY MOUTH THREE TIMES A WEEK   omeprazole 40 MG capsule Commonly known as:  PRILOSEC Take 1 capsule (40 mg total) by mouth daily.   potassium chloride 10 MEQ tablet Commonly known as: KLOR-CON Take 1 tablet (10 mEq total) by mouth daily.   potassium chloride SA 20 MEQ tablet Commonly known as: KLOR-CON Take 1 tablet (20 mEq total) by mouth 2 (two) times daily.   promethazine 25 MG tablet Commonly known as: PHENERGAN Take 1 tablet (25 mg total) by mouth every 6 (six) hours as needed for nausea or vomiting.   promethazine 25 MG suppository Commonly known as: PHENERGAN Place 1 suppository (25 mg total) rectally every 6 (six) hours as needed for nausea or vomiting.   zolpidem 5 MG tablet Commonly known as: AMBIEN TAKE 1 TABLET BY MOUTH AT BEDTIME AS NEEDED FOR SLEEP        Follow-up Information     Tisovec, Fransico Him, MD Follow up in 1 week(s).   Specialty: Internal Medicine Contact information: [place] [address] [phone]                Allergies  Allergen Reactions   Nitrofuran Derivatives     Became very sick    Consultations: GI   Procedures/Studies: CT Abdomen Pelvis W Contrast  Result Date: 10/19/2020 CLINICAL DATA:  Nausea and vomiting. Vomiting while taking prep for colonoscopy. EXAM: CT ABDOMEN AND PELVIS WITH CONTRAST TECHNIQUE: Multidetector CT imaging of the abdomen and pelvis was performed using the standard protocol following bolus administration of intravenous contrast. CONTRAST:  73m OMNIPAQUE IOHEXOL 350 MG/ML SOLN COMPARISON:  CT 10/01/2019 FINDINGS: Lower chest: Minor bandlike scarring in the left lower lobe. No acute airspace disease. No pleural effusion. Hepatobiliary: Scattered cysts throughout the liver. No suspicious liver lesion. Left hepatic lobe again wraps into the left upper quadrant, variant anatomy. Physiologically distended gallbladder. 5 mm hyperdensity in the non dependent gallbladder fundus likely represents a polyp versus less likely adherent stone. No pericholecystic fat  stranding. No biliary dilatation. Pancreas: No ductal dilatation or inflammation. Spleen: Lobulated splenic contours.  Normal in size. Adrenals/Urinary Tract: Normal adrenal glands. No hydronephrosis or perinephric edema. Homogeneous renal enhancement with symmetric excretion on delayed phase imaging. Unchanged small cyst in the anterior lower right kidney. No suspicious renal lesion. No visualized renal stone. Urinary bladder is physiologically distended without wall thickening. Stomach/Bowel: Stomach is partially distended. No abnormal gastric distension. There is no small bowel inflammation or abnormal distention/obstruction. Normal appendix. Small volume of colonic stool. No colonic wall thickening or inflammation. Vascular/Lymphatic: Normal caliber abdominal aorta. Mild atherosclerosis. Patent portal vein. Circumaortic left renal vein. No acute vascular findings. No enlarged lymph nodes in the abdomen or pelvis. Reproductive: Hysterectomy.  Stable appearance of the adnexa. Other: No free air, free fluid, or intra-abdominal fluid collection. Musculoskeletal: Anterior fusion hardware at L4-L5. Near complete disc space loss at L3-L4. IMPRESSION: 1. No acute abnormality in the abdomen/pelvis. No evidence of bowel obstruction or inflammation. 2. A 5 mm hyperdensity in the gallbladder fundus likely represents a polyp versus less likely adherent stone. No  pericholecystic fat stranding. Aortic Atherosclerosis (ICD10-I70.0). Electronically Signed   By: Keith Rake M.D.   On: 10/19/2020 19:07   DG Chest Port 1 View  Result Date: 10/19/2020 CLINICAL DATA:  Questionable sepsis - evaluate for abnormality Shortness of breath. EXAM: PORTABLE CHEST 1 VIEW COMPARISON:  04/04/2020 FINDINGS: Similar hyperinflation of prior exam. Normal heart size and mediastinal contours. No focal airspace disease. No pleural effusion or pneumothorax. No pulmonary edema. No acute osseous abnormalities are seen. IMPRESSION: Chronic  hyperinflation without acute abnormality. Electronically Signed   By: Keith Rake M.D.   On: 10/19/2020 17:46   US Abdomen Limited RUQ (LIVER/GB)  Result Date: 10/20/2020 CLINICAL DATA:  Nausea, vomiting, CT abdomen/pelvis showed gallbladder polyps EXAM: ULTRASOUND ABDOMEN LIMITED RIGHT UPPER QUADRANT COMPARISON:  CT abdomen/pelvis 1 day prior FINDINGS: Gallbladder: Multiple gallbladder polyps are seen adherent to the gallbladder wall measuring up to 7 mm. There is no gallbladder wall thickening or pericholecystic fluid. There was no sonographic Murphy sign. Common bile duct: Diameter: 8 mm Liver: No focal lesion identified. Within normal limits in parenchymal echogenicity. Portal vein is patent on color Doppler imaging with normal direction of blood flow towards the liver. Other: None. IMPRESSION: 1. Multiple gallbladder polyps measuring up to 7 mm. Recommend follow up ultrasound in 1 year to reassess. 2. Mildly dilated common bile duct measuring up to 69m; no obstructing lesion is seen. Correlate with LFT's, and MRI abdomen with MRCP may be considered for further evaluation if indicated. Electronically Signed   By: PValetta MoleM.D.   On: 10/20/2020 11:49     Discharge Exam: Vitals:   10/21/20 2055 10/22/20 0638  BP: 136/77 119/68  Pulse: 72 76  Resp: 20 18  Temp: 99.1 F (37.3 C) 98.8 F (37.1 C)  SpO2: 94% 94%   Vitals:   10/21/20 1600 10/21/20 1725 10/21/20 2055 10/22/20 0638  BP: 138/70 (!) 141/80 136/77 119/68  Pulse: 73 71 72 76  Resp: '16 17 20 18  '$ Temp:  98.1 F (36.7 C) 99.1 F (37.3 C) 98.8 F (37.1 C)  TempSrc:  Oral Oral   SpO2: 99% 93% 94% 94%  Weight:      Height:        General: Pt is alert, awake, not in acute distress Cardiovascular: RRR, S1/S2 +, no rubs, no gallops Respiratory: CTA bilaterally, no wheezing, no rhonchi Abdominal: Soft, NT, ND, bowel sounds + Extremities: no edema, no cyanosis    The results of significant diagnostics from this  hospitalization (including imaging, microbiology, ancillary and laboratory) are listed below for reference.     Microbiology: Recent Results (from the past 240 hour(s))  Resp Panel by RT-PCR (Flu A&B, Covid) Nasopharyngeal Swab     Status: None   Collection Time: 10/19/20  4:16 PM   Specimen: Nasopharyngeal Swab; Nasopharyngeal(NP) swabs in vial transport medium  Result Value Ref Range Status   SARS Coronavirus 2 by RT PCR NEGATIVE NEGATIVE Final    Comment: (NOTE) SARS-CoV-2 target nucleic acids are NOT DETECTED.  The SARS-CoV-2 RNA is generally detectable in upper respiratory specimens during the acute phase of infection. The lowest concentration of SARS-CoV-2 viral copies this assay can detect is 138 copies/mL. A negative result does not preclude SARS-Cov-2 infection and should not be used as the sole basis for treatment or other patient management decisions. A negative result may occur with  improper specimen collection/handling, submission of specimen other than nasopharyngeal swab, presence of viral mutation(s) within the areas targeted by this assay,  and inadequate number of viral copies(<138 copies/mL). A negative result must be combined with clinical observations, patient history, and epidemiological information. The expected result is Negative.  Fact Sheet for Patients:  EntrepreneurPulse.com.au  Fact Sheet for Healthcare Providers:  IncredibleEmployment.be  This test is no t yet approved or cleared by the [place] FDA and  has been authorized for detection and/or diagnosis of SARS-CoV-2 by FDA under an Emergency Use Authorization (EUA). This EUA will remain  in effect (meaning this test can be used) for the duration of the COVID-19 declaration under Section 564(b)(1) of the Act, 21 U.S.C.section 360bbb-3(b)(1), unless the authorization is terminated  or revoked sooner.       Influenza A by PCR NEGATIVE NEGATIVE Final    Influenza B by PCR NEGATIVE NEGATIVE Final    Comment: (NOTE) The Xpert Xpress SARS-CoV-2/FLU/RSV plus assay is intended as an aid in the diagnosis of influenza from Nasopharyngeal swab specimens and should not be used as a sole basis for treatment. Nasal washings and aspirates are unacceptable for Xpert Xpress SARS-CoV-2/FLU/RSV testing.  Fact Sheet for Patients: EntrepreneurPulse.com.au  Fact Sheet for Healthcare Providers: IncredibleEmployment.be  This test is not yet approved or cleared by the [place] FDA and has been authorized for detection and/or diagnosis of SARS-CoV-2 by FDA under an Emergency Use Authorization (EUA). This EUA will remain in effect (meaning this test can be used) for the duration of the COVID-19 declaration under Section 564(b)(1) of the Act, 21 U.S.C. section 360bbb-3(b)(1), unless the authorization is terminated or revoked.  Performed at [facility], [place] [address] [place], [place] [address]   Blood culture (routine x 2)     Status: None (Preliminary result)   Collection Time: 10/19/20  4:21 PM   Specimen: BLOOD LEFT FOREARM  Result Value Ref Range Status   Specimen Description   Final    BLOOD LEFT FOREARM Performed at [facility] [address] [place], [place] [address]    Special Requests   Final    BOTTLES DRAWN AEROBIC AND ANAEROBIC Blood Culture adequate volume Performed at [facility] [address], [place], [place] [address]    Culture   Final    NO GROWTH 2 DAYS Performed at [facility] [address], [place], [place] [address]    Report Status PENDING  Incomplete  Blood culture (routine x 2)     Status: None (Preliminary result)   Collection Time: 10/19/20  4:22 PM   Specimen: BLOOD  Result Value Ref Range Status   Specimen Description   Final    BLOOD RIGHT WRIST Performed at [facility]  [address], [place], [place] [address]    Special Requests   Final    BOTTLES DRAWN AEROBIC AND ANAEROBIC Blood Culture adequate volume Performed at [facility] [address], [place], [place] [address]    Culture   Final    NO GROWTH 2 DAYS Performed at [facility] [address], [place], [place] [address]    Report Status PENDING  Incomplete     Labs: BNP (last 3 results) Recent Labs    04/04/20 1000  BNP 99991111   Basic Metabolic Panel: Recent Labs  Lab 10/19/20 1553 10/19/20 1642 10/19/20 2325 10/20/20 0315 10/21/20 0532  NA 133* 130* 135 136 138  K 2.8* 2.7* 3.7 3.4* 3.5  CL 93* 93* 102 103 106  CO2 24  --  26  27 22  GLUCOSE 136* 140* 110* 111* 85  BUN 13 11 7* 7* 6*  CREATININE 0.78 0.70 0.50 0.47 0.37*  CALCIUM 10.2  --  8.9 8.9 9.6  MG 1.8  --   --  2.0  --   PHOS  --   --  1.9* 2.2*  --    Liver Function Tests: Recent Labs  Lab 10/19/20 1553 10/20/20 0315  AST 18 12*  ALT 16 12  ALKPHOS 76 55  BILITOT 1.1 1.0  PROT 7.7 5.6*  ALBUMIN 4.7 3.5   Recent Labs  Lab 10/19/20 1553  LIPASE 38   No results for input(s): AMMONIA in the last 168 hours. CBC: Recent Labs  Lab 10/19/20 1553 10/19/20 1642 10/20/20 0315 10/21/20 0532  WBC 13.6*  --  8.6 9.4  NEUTROABS 10.2*  --  5.2  --   HGB 16.5* 17.3* 13.5 14.4  HCT 47.2* 51.0* 39.3 43.9  MCV 88.7  --  91.0 94.8  PLT 409*  --  316 361   Cardiac Enzymes: Recent Labs  Lab 10/19/20 2330  CKTOTAL 45   BNP: Invalid input(s): POCBNP CBG: No results for input(s): GLUCAP in the last 168 hours. D-Dimer No results for input(s): DDIMER in the last 72 hours. Hgb A1c No results for input(s): HGBA1C in the last 72 hours. Lipid Profile No results for input(s): CHOL, HDL, LDLCALC, TRIG, CHOLHDL, LDLDIRECT in the last 72 hours. Thyroid function studies Recent Labs    10/20/20 0315  TSH 0.456   Anemia work up No results for input(s): VITAMINB12, FOLATE, FERRITIN, TIBC,  IRON, RETICCTPCT in the last 72 hours. Urinalysis    Component Value Date/Time   COLORURINE STRAW (A) 10/19/2020 1712   APPEARANCEUR CLEAR 10/19/2020 1712   LABSPEC 1.004 (L) 10/19/2020 1712   PHURINE 7.0 10/19/2020 1712   GLUCOSEU NEGATIVE 10/19/2020 1712   GLUCOSEU NEGATIVE 02/13/2019 1018   HGBUR NEGATIVE 10/19/2020 1712   BILIRUBINUR NEGATIVE 10/19/2020 1712   BILIRUBINUR Negative 10/01/2019 1056   KETONESUR 20 (A) 10/19/2020 1712   PROTEINUR NEGATIVE 10/19/2020 1712   UROBILINOGEN 0.2 10/01/2019 1056   UROBILINOGEN 0.2 02/13/2019 1018   NITRITE NEGATIVE 10/19/2020 1712   LEUKOCYTESUR NEGATIVE 10/19/2020 1712   Sepsis Labs Invalid input(s): PROCALCITONIN,  WBC,  LACTICIDVEN Microbiology Recent Results (from the past 240 hour(s))  Resp Panel by RT-PCR (Flu A&B, Covid) Nasopharyngeal Swab     Status: None   Collection Time: 10/19/20  4:16 PM   Specimen: Nasopharyngeal Swab; Nasopharyngeal(NP) swabs in vial transport medium  Result Value Ref Range Status   SARS Coronavirus 2 by RT PCR NEGATIVE NEGATIVE Final    Comment: (NOTE) SARS-CoV-2 target nucleic acids are NOT DETECTED.  The SARS-CoV-2 RNA is generally detectable in upper respiratory specimens during the acute phase of infection. The lowest concentration of SARS-CoV-2 viral copies this assay can detect is 138 copies/mL. A negative result does not preclude SARS-Cov-2 infection and should not be used as the sole basis for treatment or other patient management decisions. A negative result may occur with  improper specimen collection/handling, submission of specimen other than nasopharyngeal swab, presence of viral mutation(s) within the areas targeted by this assay, and inadequate number of viral copies(<138 copies/mL). A negative result must be combined with clinical observations, patient history, and epidemiological information. The expected result is Negative.  Fact Sheet for Patients:   EntrepreneurPulse.com.au  Fact Sheet for Healthcare Providers:  IncredibleEmployment.be  This test is no t yet approved or cleared by  the [employer] and  has been authorized for detection and/or diagnosis of SARS-CoV-2 by FDA under an Emergency Use Authorization (EUA). This EUA will remain  in effect (meaning this test can be used) for the duration of the COVID-19 declaration under Section 564(b)(1) of the Act, 21 U.S.C.section 360bbb-3(b)(1), unless the authorization is terminated  or revoked sooner.       Influenza A by PCR NEGATIVE NEGATIVE Final   Influenza B by PCR NEGATIVE NEGATIVE Final    Comment: (NOTE) The Xpert Xpress SARS-CoV-2/FLU/RSV plus assay is intended as an aid in the diagnosis of influenza from Nasopharyngeal swab specimens and should not be used as a sole basis for treatment. Nasal washings and aspirates are unacceptable for Xpert Xpress SARS-CoV-2/FLU/RSV testing.  Fact Sheet for Patients: EntrepreneurPulse.com.au  Fact Sheet for Healthcare Providers: IncredibleEmployment.be  This test is not yet approved or cleared by the [place] FDA and has been authorized for detection and/or diagnosis of SARS-CoV-2 by FDA under an Emergency Use Authorization (EUA). This EUA will remain in effect (meaning this test can be used) for the duration of the COVID-19 declaration under Section 564(b)(1) of the Act, 21 U.S.C. section 360bbb-3(b)(1), unless the authorization is terminated or revoked.  Performed at [facility], [place] [address], [place], [place] [address]   Blood culture (routine x 2)     Status: None (Preliminary result)   Collection Time: 10/19/20  4:21 PM   Specimen: BLOOD LEFT FOREARM  Result Value Ref Range Status   Specimen Description   Final    BLOOD LEFT FOREARM Performed at [facility] [address], [place], [place]  [address]    Special Requests   Final    BOTTLES DRAWN AEROBIC AND ANAEROBIC Blood Culture adequate volume Performed at [facility] [address], [place], [place] [address]    Culture   Final    NO GROWTH 2 DAYS Performed at [facility] [address] [place], [place] [address]    Report Status PENDING  Incomplete  Blood culture (routine x 2)     Status: None (Preliminary result)   Collection Time: 10/19/20  4:22 PM   Specimen: BLOOD  Result Value Ref Range Status   Specimen Description   Final    BLOOD RIGHT WRIST Performed at [facility] [address] [place], [place] [address]    Special Requests   Final    BOTTLES DRAWN AEROBIC AND ANAEROBIC Blood Culture adequate volume Performed at [facility] [address], [place], [place] [address]    Culture   Final    NO GROWTH 2 DAYS Performed at [facility] [address], [place],  [address]    Report Status PENDING  Incomplete     Time coordinating discharge: Over 30 minutes  SIGNED:   Darliss Cheney, MD  [facility] 10/22/2020, 8:51 AM  If 7PM-7AM, please contact night-coverage [email or web address]

## 2020-10-24 ENCOUNTER — Encounter (HOSPITAL_COMMUNITY): Payer: Self-pay | Admitting: Gastroenterology

## 2020-10-24 LAB — CULTURE, BLOOD (ROUTINE X 2)
Culture: NO GROWTH
Culture: NO GROWTH
Special Requests: ADEQUATE
Special Requests: ADEQUATE

## 2020-10-25 ENCOUNTER — Ambulatory Visit: Payer: Medicare Other | Admitting: Gastroenterology

## 2020-10-25 ENCOUNTER — Telehealth: Payer: Self-pay

## 2020-10-25 LAB — SURGICAL PATHOLOGY

## 2020-10-25 NOTE — Telephone Encounter (Signed)
Patient aware that she does not need follow up appointment scheduled for 10-25-2020.  Patient was advised by Dr Lyndel Safe at time of procedure to follow up in 6 - 8 weeks.  Patient scheduled to follow up with Dr Ardis Hughs per her request on 12-27-2020 at 11:10am.  Patient agreed to plan and verbalized understanding.  No further questions.

## 2020-10-25 NOTE — Telephone Encounter (Signed)
-----   Message from Milus Banister, MD sent at 10/25/2020  7:22 AM EDT ----- Regarding: RE: Appt on 10-25-20 No she does not, thanks  ----- Message ----- From: Stevan Born, CMA Sent: 10/24/2020   3:28 PM EDT To: Milus Banister, MD Subject: Appt on 10-25-20                                Patient is scheduled to follow up tomorrow to discuss colonoscopy.  Looks like she had ECL on 10-21-2020 at [facility].  Does she still need the appointment for tomorrow?   Thank you,  Elmyra Ricks

## 2020-10-26 DIAGNOSIS — E876 Hypokalemia: Secondary | ICD-10-CM | POA: Diagnosis not present

## 2020-10-27 ENCOUNTER — Encounter: Payer: Self-pay | Admitting: Gastroenterology

## 2020-10-27 NOTE — Progress Notes (Signed)
Date:  10/28/2020   ID:  Anna Moore, DOB Oct 27, 1955, MRN [id number]  PCP:  Leanna Battles, MD  Cardiologist:  Rex Kras, DO, [facility] (established care 10/28/2020) Former Cardiology Providers: Dr. Dorris Carnes (10/2016)  REASON FOR CONSULT: Dyspnea on exertion and sinus tachycardia  REQUESTING PHYSICIAN:  Tisovec, Fransico Him, MD [address] [place],  [place] [address]  Chief Complaint  Patient presents with   Sinus tachycardia   New Patient (Initial Visit)   Shortness of Breath    HPI  Anna Moore is a 65 y.o. female who presents to the office with a chief complaint of " tachycardia and shortness of breath." Patient's past medical history and cardiovascular risk factors include: Benign essential hypertension, hyperthyroidism in 2015 without radioactive iodine, anxiety, panic attacks, DJD, hyperlipidemia,  family history of premature CAD (father PCI in his 9s), advanced age, postmenopausal female.  She is referred to the office at the request of Tisovec, Fransico Him, MD for evaluation of dyspnea.  Patient was diagnosed with hyperthyroidism back in 2015 and chose not to undergo radioactive iodine intervention and has been on methimazole.  Her most recent TSH was noted to be within normal limits but additional lab work has been ordered and currently pending.  She has been experiencing episodes of being nervous, temperature regulation issues, and trouble maintaining her weight.  In addition, patient states that she has tachycardia even at rest which progressively gets worse with effort related activities.  This usually results in shortness of breath with effort related activities as well.  Recently she started noticing reduced functional capacity secondary to generalized fatigue.  She was evaluated by Dr. Harrington Challenger back in 2018 last office note reviewed that is available in epic.  She denies any chest pain at rest or with effort related activities.  Shortness of breath mostly with  effort which is chronic and stable over the last couple years.  But because of reduced functional capacity she is referred to cardiology for further evaluation and management.  Patient states that she is in the process of transitioning her care to Dr. Sharlett Iles and would like the records sent over to him for reference.  ALLERGIES: Allergies  Allergen Reactions   Nitrofuran Derivatives     Became very sick    MEDICATION LIST PRIOR TO VISIT: Current Meds  Medication Sig   ALPRAZolam (XANAX) 0.25 MG tablet Take 1 tablet (0.25 mg total) by mouth daily as needed. for anxiety (Patient taking differently: Take 0.25 mg by mouth daily as needed for anxiety.)   amLODipine (NORVASC) 10 MG tablet Take 1 tablet by mouth once daily   escitalopram (LEXAPRO) 10 MG tablet Take 10 mg by mouth daily.   estradiol (ESTRACE) 1 MG tablet Take 1 tablet by mouth twice daily   gabapentin (NEURONTIN) 300 MG capsule Take 1 capsule by mouth at bedtime as needed (pain).   hydrochlorothiazide (HYDRODIURIL) 25 MG tablet Take 1 tablet by mouth once daily (Patient taking differently: Take 12.5 mg by mouth daily.)   magnesium oxide (MAG-OX) 400 MG tablet Take 1 tablet (400 mg total) by mouth daily. (Patient taking differently: Take 200 mg by mouth daily.)   methimazole (TAPAZOLE) 5 MG tablet TAKE 1 TABLET BY MOUTH THREE TIMES A WEEK   omeprazole (PRILOSEC) 40 MG capsule Take 1 capsule (40 mg total) by mouth daily.   promethazine (PHENERGAN) 25 MG tablet Take 1 tablet (25 mg total) by mouth every 6 (six) hours as needed for nausea or vomiting.  PAST MEDICAL HISTORY: Past Medical History:  Diagnosis Date   Anxiety    DJD (degenerative joint disease)    Of the back   Hypertension    Thyroid disease    hyperthyroid    PAST SURGICAL HISTORY: Past Surgical History:  Procedure Laterality Date   ABDOMINAL HYSTERECTOMY     BACK SURGERY  01/04/2008   On L4 and L5   BIOPSY  10/21/2020   Procedure: BIOPSY;  Surgeon:  Jackquline Denmark, MD;  Location: [facility];  Service: Endoscopy;;   COLONOSCOPY     COLONOSCOPY WITH PROPOFOL N/A 10/21/2020   Procedure: COLONOSCOPY WITH PROPOFOL;  Surgeon: Jackquline Denmark, MD;  Location: [facility];  Service: Endoscopy;  Laterality: N/A;   ESOPHAGOGASTRODUODENOSCOPY (EGD) WITH PROPOFOL N/A 10/21/2020   Procedure: ESOPHAGOGASTRODUODENOSCOPY (EGD) WITH PROPOFOL;  Surgeon: Jackquline Denmark, MD;  Location: [facility];  Service: Endoscopy;  Laterality: N/A;   NASAL SEPTUM SURGERY     OTHER SURGICAL HISTORY  03/05/1996   Hysterectomy   PAROTIDECTOMY  03/05/1988   POLYPECTOMY  10/21/2020   Procedure: POLYPECTOMY;  Surgeon: Jackquline Denmark, MD;  Location: [facility];  Service: Endoscopy;;    FAMILY HISTORY: The patient family history includes Atrial fibrillation in her mother; Cancer in her father; Diabetes in her father; Heart attack in her father; Heart disease in her father; Hypertension in her father.  SOCIAL HISTORY:  The patient  reports that she has never smoked. She has never used smokeless tobacco. She reports that she does not currently use alcohol after a past usage of about 6.0 standard drinks per week. She reports that she does not currently use drugs after having used the following drugs: Marijuana.  REVIEW OF SYSTEMS: Review of Systems  Constitutional: Negative for chills and fever.  HENT:  Negative for hoarse voice and nosebleeds.   Eyes:  Negative for discharge, double vision and pain.  Cardiovascular:  Positive for dyspnea on exertion and palpitations. Negative for chest pain, claudication, leg swelling, near-syncope, orthopnea, paroxysmal nocturnal dyspnea and syncope.  Respiratory:  Negative for hemoptysis and shortness of breath.   Endocrine: Positive for heat intolerance.  Musculoskeletal:  Negative for muscle cramps and myalgias.  Gastrointestinal:  Positive for nausea. Negative for abdominal pain, constipation, diarrhea, hematemesis, hematochezia, melena  and vomiting.  Neurological:  Negative for dizziness and light-headedness.  Psychiatric/Behavioral:  The patient is nervous/anxious.    PHYSICAL EXAM: Vitals with BMI 10/28/2020 10/28/2020 10/22/2020  Height - '5\' 5"'$  -  Weight - 128 lbs -  BMI - Q000111Q -  Systolic [id number] Q000111Q [id number]  Diastolic 83 92 68  Pulse 96 81 76    CONSTITUTIONAL: Well-developed and well-nourished. No acute distress.  SKIN: Skin is warm and dry. No rash noted. No cyanosis. No pallor. No jaundice HEAD: Normocephalic and atraumatic.  EYES: No scleral icterus MOUTH/THROAT: Moist oral membranes.  NECK: No JVD present. No thyromegaly noted. No carotid bruits  LYMPHATIC: No visible cervical adenopathy.  CHEST Normal respiratory effort. No intercostal retractions  LUNGS: Clear to auscultation bilaterally.  No stridor. No wheezes. No rales.  CARDIOVASCULAR: Tachycardic, regular, positive Q000111Q, extrasystolic beats appreciated, no gallops or rubs. ABDOMINAL: Nonobese, soft, nontender, nondistended, positive bowel sounds in all 4 quadrants, no apparent ascites.  EXTREMITIES: No peripheral edema HEMATOLOGIC: No significant bruising NEUROLOGIC: Oriented to person, place, and time. Nonfocal. Normal muscle tone.  PSYCHIATRIC: Normal mood and affect. Normal behavior. Cooperative  CARDIAC DATABASE: EKG: 10/28/2020: Normal sinus rhythm, 90 bpm, without underlying ischemia or injury pattern.  Echocardiogram:  10/2016: - Left ventricle: The cavity size was normal. Systolic function was    normal. The estimated ejection fraction was in the range of 55%    to 60%. Wall motion was normal; there were no regional wall    motion abnormalities. Doppler parameters are consistent with    abnormal left ventricular relaxation (grade 1 diastolic    dysfunction).  - Right ventricle: The cavity size was normal. Wall thickness was    normal.    Stress Testing: No results found for this or any previous visit from the past 1095 days.  Heart  Catheterization: None  LABORATORY DATA: CBC Latest Ref Rng & Units 10/21/2020 10/20/2020 10/19/2020  WBC 4.0 - 10.5 K/uL 9.4 8.6 -  Hemoglobin 12.0 - 15.0 g/dL 14.4 13.5 17.3(H)  Hematocrit 36.0 - 46.0 % 43.9 39.3 51.0(H)  Platelets 150 - 400 K/uL 361 316 -    CMP Latest Ref Rng & Units 10/21/2020 10/20/2020 10/19/2020  Glucose 70 - 99 mg/dL 85 111(H) 110(H)  BUN 8 - 23 mg/dL 6(L) 7(L) 7(L)  Creatinine 0.44 - 1.00 mg/dL 0.37(L) 0.47 0.50  Sodium 135 - 145 mmol/L 138 136 135  Potassium 3.5 - 5.1 mmol/L 3.5 3.4(L) 3.7  Chloride 98 - 111 mmol/L 106 103 102  CO2 22 - 32 mmol/L '22 27 26  '$ Calcium 8.9 - 10.3 mg/dL 9.6 8.9 8.9  Total Protein 6.5 - 8.1 g/dL - 5.6(L) -  Total Bilirubin 0.3 - 1.2 mg/dL - 1.0 -  Alkaline Phos 38 - 126 U/L - 55 -  AST 15 - 41 U/L - 12(L) -  ALT 0 - 44 U/L - 12 -    Lipid Panel  Lab Results  Component Value Date   CHOL 254 (H) 08/27/2018   HDL 85 08/27/2018   LDLCALC 144 (H) 08/27/2018   LDLDIRECT 127.1 04/30/2012   TRIG 128 08/27/2018   CHOLHDL 3.0 08/27/2018     No components found for: NTPROBNP No results for input(s): PROBNP in the last 8760 hours. Recent Labs    04/11/20 1638 10/19/20 2300 10/20/20 0315  TSH 0.60 0.473 0.456    BMP Recent Labs    10/19/20 2325 10/20/20 0315 10/21/20 0532  NA 135 136 138  K 3.7 3.4* 3.5  CL 102 103 106  CO2 '26 27 22  '$ GLUCOSE 110* 111* 85  BUN 7* 7* 6*  CREATININE 0.50 0.47 0.37*  CALCIUM 8.9 8.9 9.6  GFRNONAA >60 >60 >60    HEMOGLOBIN A1C Lab Results  Component Value Date   HGBA1C 5.8 10/27/2019   MPG 108 08/27/2018    IMPRESSION:    ICD-10-CM   1. Dyspnea on exertion  R06.00 PCV ECHOCARDIOGRAM COMPLETE    Basic metabolic panel    Pro b natriuretic peptide (BNP)    2. Tachycardia  R00.0 EKG 12-Lead    LONG TERM MONITOR (3-14 DAYS)    3. Hyperthyroidism  E05.90 LONG TERM MONITOR (3-14 DAYS)    4. Family history of premature CAD  Z82.49     5. Pure hypercholesterolemia  E78.00         RECOMMENDATIONS: Anna Moore is a 65 y.o. female whose past medical history and cardiac risk factors include: Benign essential hypertension, hyperthyroidism in 2015 without radioactive iodine, anxiety, panic attacks, DJD, hyperlipidemia,  family history of premature CAD (father PCI in his 12s), advanced age, postmenopausal female.  Dyspnea on exertion Chronic and progressive Echocardiogram will be ordered to evaluate for structural heart disease and left ventricular  systolic function. Check BMP, BNP  Tachycardia: Most likely secondary to underlying hyperthyroidism. EKG nonischemic. 7-day extended Holter monitor to evaluate for underlying dysrhythmias. We will consider propranolol for symptom management if she does not have any underlying dysrhythmias noted  Hyperthyroidism: I suspect her tachycardia and dyspnea on exertion is exacerbated due to possible untreated hyperthyroidism.  I have asked her to consider seeing endocrinology for further evaluation and management. Patient states that she will discuss it further with PCP.  Hyperlipidemia: Most recent lipid profile independently reviewed and noted above. Patient states that she will be establishing care with Dr. Sharlett Iles and is scheduled to have upcoming labs.  I have asked her to bring a copy with her at the next visit.  As a part of this consultation reviewed outside records available in [facility], lab work,  including office notes.  Also reviewed last cardiology notes available in epic.  Discussed disease management and coordination of care.   FINAL MEDICATION LIST END OF ENCOUNTER: No orders of the defined types were placed in this encounter.   Medications Discontinued During This Encounter  Medication Reason   potassium chloride (KLOR-CON) 10 MEQ tablet Error   potassium chloride SA (KLOR-CON) 20 MEQ tablet Error   zolpidem (AMBIEN) 5 MG tablet Error   promethazine (PHENERGAN) 25 MG suppository Duplicate      Current Outpatient Medications:    ALPRAZolam (XANAX) 0.25 MG tablet, Take 1 tablet (0.25 mg total) by mouth daily as needed. for anxiety (Patient taking differently: Take 0.25 mg by mouth daily as needed for anxiety.), Disp: 30 tablet, Rfl: 0   amLODipine (NORVASC) 10 MG tablet, Take 1 tablet by mouth once daily, Disp: 90 tablet, Rfl: 0   escitalopram (LEXAPRO) 10 MG tablet, Take 10 mg by mouth daily., Disp: , Rfl:    estradiol (ESTRACE) 1 MG tablet, Take 1 tablet by mouth twice daily, Disp: 180 tablet, Rfl: 0   gabapentin (NEURONTIN) 300 MG capsule, Take 1 capsule by mouth at bedtime as needed (pain)., Disp: , Rfl:    hydrochlorothiazide (HYDRODIURIL) 25 MG tablet, Take 1 tablet by mouth once daily (Patient taking differently: Take 12.5 mg by mouth daily.), Disp: 90 tablet, Rfl: 0   magnesium oxide (MAG-OX) 400 MG tablet, Take 1 tablet (400 mg total) by mouth daily. (Patient taking differently: Take 200 mg by mouth daily.), Disp: 14 tablet, Rfl: 0   methimazole (TAPAZOLE) 5 MG tablet, TAKE 1 TABLET BY MOUTH THREE TIMES A WEEK, Disp: 36 tablet, Rfl: 0   omeprazole (PRILOSEC) 40 MG capsule, Take 1 capsule (40 mg total) by mouth daily., Disp: 30 capsule, Rfl: 0   promethazine (PHENERGAN) 25 MG tablet, Take 1 tablet (25 mg total) by mouth every 6 (six) hours as needed for nausea or vomiting., Disp: 12 tablet, Rfl: 0  Orders Placed This Encounter  Procedures   Basic metabolic panel   Pro b natriuretic peptide (BNP)   LONG TERM MONITOR (3-14 DAYS)   EKG 12-Lead   PCV ECHOCARDIOGRAM COMPLETE    There are no Patient Instructions on file for this visit.   --Continue cardiac medications as reconciled in final medication list. --Return in about 4 weeks (around 11/25/2020) for Follow up, Dyspnea, Review test results. Or sooner if needed. --Continue follow-up with your primary care physician regarding the management of your other chronic comorbid conditions.  Patient's questions and concerns  were addressed to her satisfaction. She voices understanding of the instructions provided during this encounter.   This note was created  using a voice recognition software as a result there may be grammatical errors inadvertently enclosed that do not reflect the nature of this encounter. Every attempt is made to correct such errors.  Rex Kras, [place], [facility]  Pager: [phone] Office: [phone]

## 2020-10-28 ENCOUNTER — Ambulatory Visit: Payer: Medicare Other | Admitting: Cardiology

## 2020-10-28 ENCOUNTER — Other Ambulatory Visit: Payer: Self-pay

## 2020-10-28 ENCOUNTER — Encounter: Payer: Self-pay | Admitting: Cardiology

## 2020-10-28 ENCOUNTER — Other Ambulatory Visit: Payer: Self-pay | Admitting: Cardiology

## 2020-10-28 VITALS — BP 142/83 | HR 96 | Temp 97.4°F | Resp 16 | Ht 65.0 in | Wt 128.0 lb

## 2020-10-28 DIAGNOSIS — E7800 Pure hypercholesterolemia, unspecified: Secondary | ICD-10-CM

## 2020-10-28 DIAGNOSIS — R000 Tachycardia, unspecified: Secondary | ICD-10-CM

## 2020-10-28 DIAGNOSIS — R0609 Other forms of dyspnea: Secondary | ICD-10-CM

## 2020-10-28 DIAGNOSIS — Z8249 Family history of ischemic heart disease and other diseases of the circulatory system: Secondary | ICD-10-CM

## 2020-10-28 DIAGNOSIS — E0590 Thyrotoxicosis, unspecified without thyrotoxic crisis or storm: Secondary | ICD-10-CM

## 2020-10-28 DIAGNOSIS — I10 Essential (primary) hypertension: Secondary | ICD-10-CM

## 2020-10-28 DIAGNOSIS — R9431 Abnormal electrocardiogram [ECG] [EKG]: Secondary | ICD-10-CM

## 2020-11-02 ENCOUNTER — Ambulatory Visit: Payer: Medicare Other

## 2020-11-02 ENCOUNTER — Inpatient Hospital Stay: Payer: Medicare Other

## 2020-11-02 ENCOUNTER — Other Ambulatory Visit: Payer: Self-pay

## 2020-11-02 DIAGNOSIS — R0609 Other forms of dyspnea: Secondary | ICD-10-CM

## 2020-11-02 DIAGNOSIS — E0590 Thyrotoxicosis, unspecified without thyrotoxic crisis or storm: Secondary | ICD-10-CM

## 2020-11-02 DIAGNOSIS — R0600 Dyspnea, unspecified: Secondary | ICD-10-CM

## 2020-11-02 DIAGNOSIS — R000 Tachycardia, unspecified: Secondary | ICD-10-CM

## 2020-11-03 ENCOUNTER — Other Ambulatory Visit: Payer: Self-pay

## 2020-11-03 DIAGNOSIS — R0609 Other forms of dyspnea: Secondary | ICD-10-CM

## 2020-11-03 DIAGNOSIS — R0600 Dyspnea, unspecified: Secondary | ICD-10-CM

## 2020-11-14 ENCOUNTER — Telehealth: Payer: Self-pay | Admitting: Gastroenterology

## 2020-11-14 ENCOUNTER — Other Ambulatory Visit: Payer: Self-pay

## 2020-11-14 MED ORDER — OMEPRAZOLE 40 MG PO CPDR: 40.0000 mg | DELAYED_RELEASE_CAPSULE | Freq: Every day | ORAL | 1 refills | Status: DC

## 2020-11-14 NOTE — Telephone Encounter (Signed)
Rx for omeprazole sent to pharmacy as requested.  Patient is scheduled to follow up with Dr Ardis Hughs on 12-27-2020.

## 2020-11-14 NOTE — Telephone Encounter (Signed)
Patient called requested Omeprazole refill stated it was given to her at the hospital said its been helping her a lot also requested a 90 day supple if possible.

## 2020-11-16 LAB — BASIC METABOLIC PANEL
BUN/Creatinine Ratio: 19 (ref 12–28)
BUN: 13 mg/dL (ref 8–27)
CO2: 23 mmol/L (ref 20–29)
Calcium: 10.9 mg/dL — ABNORMAL HIGH (ref 8.7–10.3)
Chloride: 97 mmol/L (ref 96–106)
Creatinine, Ser: 0.68 mg/dL (ref 0.57–1.00)
Glucose: 96 mg/dL (ref 65–99)
Potassium: 4.1 mmol/L (ref 3.5–5.2)
Sodium: 139 mmol/L (ref 134–144)
eGFR: 97 mL/min/{1.73_m2} (ref >59)

## 2020-11-16 LAB — PRO B NATRIURETIC PEPTIDE: NT-Pro BNP: 62 pg/mL (ref 0–301)

## 2020-11-18 ENCOUNTER — Ambulatory Visit
Admission: RE | Admit: 2020-11-18 | Discharge: 2020-11-18 | Disposition: A | Discharge status: Home / Self Care | Payer: Medicare Other | Source: Ambulatory Visit | Attending: Cardiology | Admitting: Cardiology

## 2020-11-18 DIAGNOSIS — E7800 Pure hypercholesterolemia, unspecified: Secondary | ICD-10-CM

## 2020-12-01 DIAGNOSIS — R8290 Unspecified abnormal findings in urine: Secondary | ICD-10-CM | POA: Diagnosis not present

## 2020-12-01 DIAGNOSIS — I10 Essential (primary) hypertension: Secondary | ICD-10-CM | POA: Diagnosis not present

## 2020-12-01 DIAGNOSIS — R82998 Other abnormal findings in urine: Secondary | ICD-10-CM | POA: Diagnosis not present

## 2020-12-01 DIAGNOSIS — Z1212 Encounter for screening for malignant neoplasm of rectum: Secondary | ICD-10-CM | POA: Diagnosis not present

## 2020-12-05 ENCOUNTER — Ambulatory Visit: Payer: Medicare Other | Admitting: Cardiology

## 2020-12-05 ENCOUNTER — Encounter: Payer: Self-pay | Admitting: Cardiology

## 2020-12-05 ENCOUNTER — Other Ambulatory Visit: Payer: Self-pay

## 2020-12-05 VITALS — BP 143/75 | HR 76 | Resp 16 | Ht 65.0 in | Wt 134.6 lb

## 2020-12-05 DIAGNOSIS — Z8249 Family history of ischemic heart disease and other diseases of the circulatory system: Secondary | ICD-10-CM

## 2020-12-05 DIAGNOSIS — I2510 Atherosclerotic heart disease of native coronary artery without angina pectoris: Secondary | ICD-10-CM

## 2020-12-05 DIAGNOSIS — E0590 Thyrotoxicosis, unspecified without thyrotoxic crisis or storm: Secondary | ICD-10-CM

## 2020-12-05 DIAGNOSIS — E7800 Pure hypercholesterolemia, unspecified: Secondary | ICD-10-CM

## 2020-12-05 NOTE — Progress Notes (Signed)
Date:  12/05/2020   ID:  Anna Moore, DOB [birth year], MRN [id number]  PCP:  Leanna Battles, MD  Cardiologist:  Rex Kras, DO, [facility] (established care 10/28/2020) Former Cardiology Providers: Dr. Dorris Carnes (10/2016)  Date: 12/05/20 Last Office Visit: 10/28/2020  Chief Complaint  Patient presents with   Dyspnea on exertion   Results   Follow-up    HPI  Anna Moore is a 65 y.o. female who presents to the office with a chief complaint of " reevaluation of sinus tachycardia, shortness of breath, and discuss test results. " Patient's past medical history and cardiovascular risk factors include: Benign essential hypertension, hyperthyroidism in 2015 without radioactive iodine, anxiety, panic attacks, DJD, hyperlipidemia,  family history of premature CAD (father PCI in his 51s), advanced age, postmenopausal female.  She is referred to the office at the request of Leanna Battles, MD for evaluation of dyspnea.  Patient was initially referred to the office for evaluation of dyspnea that has been present for the last couple years and getting progressively worse with reduced functional capacity.  In addition, she was experiencing symptoms of palpitations in the setting of hyperthyroidism diagnosed in 2015 and did not want to proceed with radioactive iodine intervention and currently on methimazole.  Given her symptoms, risk factors, that shared decision was to proceed with coronary artery calcium score and echocardiogram for further risk stratification.  Given her symptoms of palpitations shared decision was to proceed with a Holter monitor.  Since last office visit patient states that her home blood pressures are very well controlled.  Blood pressure log reviewed.  Systolic blood pressures range between 782-956 mmHg and diastolic blood pressures are around 80 mmHg or less.  Patient's pulse is also very well controlled in the 60s.  Since last visit patient has been started on  omeprazole which has significantly helped her GERD symptoms.  And she will also establish care with Dr. Sharlett Iles and brings in her most recent labs for review.  She denies any chest pain at rest or with effort related activities.  Her shortness of breath is improved significantly.  Patient states that she feels much better today compared to the last visit.   ALLERGIES: Allergies  Allergen Reactions   Nitrofuran Derivatives     Became very sick    MEDICATION LIST PRIOR TO VISIT: Current Meds  Medication Sig   ALPRAZolam (XANAX) 0.25 MG tablet Take 1 tablet (0.25 mg total) by mouth daily as needed. for anxiety (Patient taking differently: Take 0.25 mg by mouth daily as needed for anxiety.)   amLODipine (NORVASC) 10 MG tablet Take 1 tablet by mouth once daily   escitalopram (LEXAPRO) 10 MG tablet Take 10 mg by mouth daily.   estradiol (ESTRACE) 1 MG tablet Take 1 tablet by mouth twice daily   gabapentin (NEURONTIN) 300 MG capsule Take 1 capsule by mouth at bedtime as needed (pain).   hydrochlorothiazide (HYDRODIURIL) 25 MG tablet Take 1 tablet by mouth once daily (Patient taking differently: Take 12.5 mg by mouth daily.)   methimazole (TAPAZOLE) 5 MG tablet TAKE 1 TABLET BY MOUTH THREE TIMES A WEEK   omeprazole (PRILOSEC) 40 MG capsule Take 1 capsule (40 mg total) by mouth daily.   promethazine (PHENERGAN) 25 MG tablet Take 1 tablet (25 mg total) by mouth every 6 (six) hours as needed for nausea or vomiting.     PAST MEDICAL HISTORY: Past Medical History:  Diagnosis Date   Anxiety    DJD (degenerative joint disease)  Of the back   Hypertension    Thyroid disease    hyperthyroid    PAST SURGICAL HISTORY: Past Surgical History:  Procedure Laterality Date   ABDOMINAL HYSTERECTOMY     BACK SURGERY  01/04/2008   On L4 and L5   BIOPSY  10/21/2020   Procedure: BIOPSY;  Surgeon: Jackquline Denmark, MD;  Location: [facility];  Service: Endoscopy;;   COLONOSCOPY     COLONOSCOPY WITH  PROPOFOL N/A 10/21/2020   Procedure: COLONOSCOPY WITH PROPOFOL;  Surgeon: Jackquline Denmark, MD;  Location: [facility];  Service: Endoscopy;  Laterality: N/A;   ESOPHAGOGASTRODUODENOSCOPY (EGD) WITH PROPOFOL N/A 10/21/2020   Procedure: ESOPHAGOGASTRODUODENOSCOPY (EGD) WITH PROPOFOL;  Surgeon: Jackquline Denmark, MD;  Location: [facility];  Service: Endoscopy;  Laterality: N/A;   NASAL SEPTUM SURGERY     OTHER SURGICAL HISTORY  03/05/1996   Hysterectomy   PAROTIDECTOMY  03/05/1988   POLYPECTOMY  10/21/2020   Procedure: POLYPECTOMY;  Surgeon: Jackquline Denmark, MD;  Location: [facility];  Service: Endoscopy;;    FAMILY HISTORY: The patient family history includes Atrial fibrillation in her mother; Cancer in her father; Diabetes in her father; Heart attack in her father; Heart disease in her father; Hypertension in her father.  SOCIAL HISTORY:  The patient  reports that she has never smoked. She has never used smokeless tobacco. She reports that she does not currently use alcohol after a past usage of about 6.0 standard drinks per week. She reports that she does not currently use drugs after having used the following drugs: Marijuana.  REVIEW OF SYSTEMS: Review of Systems  Constitutional: Negative for chills and fever.  HENT:  Negative for hoarse voice and nosebleeds.   Eyes:  Negative for discharge, double vision and pain.  Cardiovascular:  Negative for chest pain, claudication, dyspnea on exertion, leg swelling, near-syncope, orthopnea, palpitations, paroxysmal nocturnal dyspnea and syncope.  Respiratory:  Negative for hemoptysis and shortness of breath.   Endocrine: Negative for heat intolerance.  Musculoskeletal:  Negative for muscle cramps and myalgias.  Gastrointestinal:  Negative for abdominal pain, constipation, diarrhea, hematemesis, hematochezia, melena, nausea and vomiting.  Neurological:  Negative for dizziness and light-headedness.  Psychiatric/Behavioral:  The patient is not  nervous/anxious.    PHYSICAL EXAM: Vitals with BMI 12/05/2020 10/28/2020 10/28/2020  Height 5\' 5"  - 5\' 5"   Weight 134 lbs 10 oz - 128 lbs  BMI 35.4 - 65.6  Systolic 812 751 700  Diastolic 75 83 92  Pulse 76 96 81    CONSTITUTIONAL: Well-developed and well-nourished. No acute distress.  SKIN: Skin is warm and dry. No rash noted. No cyanosis. No pallor. No jaundice HEAD: Normocephalic and atraumatic.  EYES: No scleral icterus MOUTH/THROAT: Moist oral membranes.  NECK: No JVD present. No thyromegaly noted. No carotid bruits  LYMPHATIC: No visible cervical adenopathy.  CHEST Normal respiratory effort. No intercostal retractions  LUNGS: Clear to auscultation bilaterally.  No stridor. No wheezes. No rales.  CARDIOVASCULAR: Tachycardic, regular, positive F7-C9, extrasystolic beats appreciated, no gallops or rubs. ABDOMINAL: Nonobese, soft, nontender, nondistended, positive bowel sounds in all 4 quadrants, no apparent ascites.  EXTREMITIES: No peripheral edema HEMATOLOGIC: No significant bruising NEUROLOGIC: Oriented to person, place, and time. Nonfocal. Normal muscle tone.  PSYCHIATRIC: Normal mood and affect. Normal behavior. Cooperative  CARDIAC DATABASE: EKG: 10/28/2020: Normal sinus rhythm, 90 bpm, without underlying ischemia or injury pattern.  Echocardiogram: 11/02/2020:  Left ventricle cavity is normal in size and wall thickness. Hyperdynamic LV systolic function with visual EF >70%. Normal diastolic  filling pattern.  No significant valvular abnormality.  No evidence of pulmonary hypertension.   Stress Testing: No results found for this or any previous visit from the past 1095 days.  Heart Catheterization: None  CT Cardiac Scoring:  11/19/2020 Total CAC 20.1 AU, 65th percentile for patient's age, sex, and race.  7 day extended Holter monitor:  Dominant rhythm normal sinus. Heart rate 47-156 bpm. Avg HR 64 bpm. No atrial fibrillation, ventricular tachycardia, high grade AV  block, pauses (3 seconds or longer). Total ventricular ectopic burden <1%. Total supraventricular ectopic burden <1%. Patient triggered events: 0.   LABORATORY DATA: CBC Latest Ref Rng & Units 10/21/2020 10/20/2020 10/19/2020  WBC 4.0 - 10.5 K/uL 9.4 8.6 -  Hemoglobin 12.0 - 15.0 g/dL 14.4 13.5 17.3(H)  Hematocrit 36.0 - 46.0 % 43.9 39.3 51.0(H)  Platelets 150 - 400 K/uL 361 316 -    CMP Latest Ref Rng & Units 11/15/2020 10/21/2020 10/20/2020  Glucose 65 - 99 mg/dL 96 85 111(H)  BUN 8 - 27 mg/dL 13 6(L) 7(L)  Creatinine 0.57 - 1.00 mg/dL 0.68 0.37(L) 0.47  Sodium 134 - 144 mmol/L 139 138 136  Potassium 3.5 - 5.2 mmol/L 4.1 3.5 3.4(L)  Chloride 96 - 106 mmol/L 97 106 103  CO2 20 - 29 mmol/L 23 22 27   Calcium 8.7 - 10.3 mg/dL 10.9(H) 9.6 8.9  Total Protein 6.5 - 8.1 g/dL - - 5.6(L)  Total Bilirubin 0.3 - 1.2 mg/dL - - 1.0  Alkaline Phos 38 - 126 U/L - - 55  AST 15 - 41 U/L - - 12(L)  ALT 0 - 44 U/L - - 12    Lipid Panel  Lab Results  Component Value Date   CHOL 254 (H) 08/27/2018   HDL 85 08/27/2018   LDLCALC 144 (H) 08/27/2018   LDLDIRECT 127.1 04/30/2012   TRIG 128 08/27/2018   CHOLHDL 3.0 08/27/2018     No components found for: NTPROBNP Recent Labs    11/15/20 1203  PROBNP 62   Recent Labs    04/11/20 1638 10/19/20 2300 10/20/20 0315  TSH 0.60 0.473 0.456    BMP Recent Labs    10/19/20 2325 10/20/20 0315 10/21/20 0532 11/15/20 1203  NA 135 136 138 139  K 3.7 3.4* 3.5 4.1  CL 102 103 106 97  CO2 26 27 22 23   GLUCOSE 110* 111* 85 96  BUN 7* 7* 6* 13  CREATININE 0.50 0.47 0.37* 0.68  CALCIUM 8.9 8.9 9.6 10.9*  GFRNONAA >60 >60 >60  --     HEMOGLOBIN A1C Lab Results  Component Value Date   HGBA1C 5.8 10/27/2019   MPG 108 08/27/2018    External Labs: Collected: 11/24/2020 provided by the patient. Total cholesterol 217, triglycerides 113, HDL 67, LDL 127, non-HDL 150. Apolipoprotein B 104 (normal levels 49-90) TSH 0.58, free T4 1.0 (within  normal limits) Sodium 136, potassium 4.2, chloride 102, bicarb 24, BUN 16, creatinine 0.7. AST 16, ALT 16, alkaline phosphatase 58. Hemoglobin 15.3 g/dL, hematocrit 45.8%  IMPRESSION:    ICD-10-CM   1. Coronary atherosclerosis due to calcified coronary lesion  I25.10 PCV CARDIAC STRESS TEST   I25.84     2. Pure hypercholesterolemia  E78.00     3. Family history of premature CAD  Z82.49     4. Hyperthyroidism  E05.90        RECOMMENDATIONS: Anna Moore is a 65 y.o. female whose past medical history and cardiac risk factors include: Benign essential hypertension, hyperthyroidism  in 2015 without radioactive iodine, anxiety, panic attacks, DJD, hyperlipidemia,  family history of premature CAD (father PCI in his 53s), advanced age, postmenopausal female.  Coronary atherosclerosis due to calcified coronary lesion Total CAC 20.1 AU, 65th percentile for patient's age, sex, and race. Given her coronary calcium score, most recent lipid profile, elevated apolipoprotein levels, and family history of premature CAD recommended aspirin and statin therapy. Patient is hesitant with regards to initiating statin therapy but will discuss it further with family and PCP in the interim. Will schedule for a exercise treadmill stress test to evaluate for reversible ischemia. Monitor for now. Educated in the importance of secondary prevention.  Pure hypercholesterolemia Patient's LDL levels are within acceptable ranges; however, given her other risk factors as mentioned above would recommend statin therapy. She would like to reconsider this and will discuss at the next visit.  Hyperthyroidism Currently on methimazole. May consider establishing care with endocrinology-will defer to PCP  Hypertension: Home blood pressures are very well controlled. Blood pressure log reviewed. Medications reconciled. Low-salt diet recommended.  Results of the coronary calcium score, monitor results, and  echocardiogram reviewed with her in great detail and noted above for further reference.  Independently reviewed outside labs provided by the patient and summarized above for further reference.  FINAL MEDICATION LIST END OF ENCOUNTER: No orders of the defined types were placed in this encounter.   Medications Discontinued During This Encounter  Medication Reason   magnesium oxide (MAG-OX) 400 MG tablet Error     Current Outpatient Medications:    ALPRAZolam (XANAX) 0.25 MG tablet, Take 1 tablet (0.25 mg total) by mouth daily as needed. for anxiety (Patient taking differently: Take 0.25 mg by mouth daily as needed for anxiety.), Disp: 30 tablet, Rfl: 0   amLODipine (NORVASC) 10 MG tablet, Take 1 tablet by mouth once daily, Disp: 90 tablet, Rfl: 0   escitalopram (LEXAPRO) 10 MG tablet, Take 10 mg by mouth daily., Disp: , Rfl:    estradiol (ESTRACE) 1 MG tablet, Take 1 tablet by mouth twice daily, Disp: 180 tablet, Rfl: 0   gabapentin (NEURONTIN) 300 MG capsule, Take 1 capsule by mouth at bedtime as needed (pain)., Disp: , Rfl:    hydrochlorothiazide (HYDRODIURIL) 25 MG tablet, Take 1 tablet by mouth once daily (Patient taking differently: Take 12.5 mg by mouth daily.), Disp: 90 tablet, Rfl: 0   methimazole (TAPAZOLE) 5 MG tablet, TAKE 1 TABLET BY MOUTH THREE TIMES A WEEK, Disp: 36 tablet, Rfl: 0   omeprazole (PRILOSEC) 40 MG capsule, Take 1 capsule (40 mg total) by mouth daily., Disp: 90 capsule, Rfl: 1   promethazine (PHENERGAN) 25 MG tablet, Take 1 tablet (25 mg total) by mouth every 6 (six) hours as needed for nausea or vomiting., Disp: 12 tablet, Rfl: 0  Orders Placed This Encounter  Procedures   PCV CARDIAC STRESS TEST    There are no Patient Instructions on file for this visit.   --Continue cardiac medications as reconciled in final medication list. --Return in about 6 weeks (around 01/16/2021) for Follow up, Coronary artery calcification, Review test results. Or sooner if  needed. --Continue follow-up with your primary care physician regarding the management of your other chronic comorbid conditions.  Patient's questions and concerns were addressed to her satisfaction. She voices understanding of the instructions provided during this encounter.   This note was created using a voice recognition software as a result there may be grammatical errors inadvertently enclosed that do not reflect the nature of  this encounter. Every attempt is made to correct such errors.  Rex Kras, [place], [facility]  Pager: [phone] Office: [phone]

## 2020-12-27 ENCOUNTER — Ambulatory Visit: Payer: Medicare Other | Admitting: Gastroenterology

## 2020-12-30 ENCOUNTER — Other Ambulatory Visit: Payer: Self-pay

## 2020-12-30 ENCOUNTER — Ambulatory Visit: Payer: Medicare Other

## 2020-12-30 DIAGNOSIS — I2510 Atherosclerotic heart disease of native coronary artery without angina pectoris: Secondary | ICD-10-CM

## 2021-01-18 ENCOUNTER — Encounter: Payer: Self-pay | Admitting: Cardiology

## 2021-01-18 ENCOUNTER — Other Ambulatory Visit: Payer: Self-pay

## 2021-01-18 ENCOUNTER — Ambulatory Visit: Payer: Medicare Other | Admitting: Cardiology

## 2021-01-18 VITALS — BP 128/75 | HR 70 | Resp 16 | Ht 65.0 in | Wt 136.4 lb

## 2021-01-18 DIAGNOSIS — Z8249 Family history of ischemic heart disease and other diseases of the circulatory system: Secondary | ICD-10-CM

## 2021-01-18 DIAGNOSIS — I2510 Atherosclerotic heart disease of native coronary artery without angina pectoris: Secondary | ICD-10-CM

## 2021-01-18 DIAGNOSIS — E7800 Pure hypercholesterolemia, unspecified: Secondary | ICD-10-CM

## 2021-01-18 DIAGNOSIS — E0590 Thyrotoxicosis, unspecified without thyrotoxic crisis or storm: Secondary | ICD-10-CM

## 2021-01-18 NOTE — Progress Notes (Signed)
Date:  01/18/2021   ID:  Anna Moore, DOB [birth year], MRN [id number]  PCP:  Leanna Battles, MD  Cardiologist:  Rex Kras, DO, [facility] (established care 10/28/2020) Former Cardiology Providers: Dr. Dorris Carnes (10/2016)  Date: 01/18/21 Last Office Visit: 12/05/2020  Chief Complaint  Patient presents with   Coronary artery calcification   Results   Follow-up    HPI  Anna Moore is a 65 y.o. female who presents to the office with a chief complaint of " history of CAC and discuss test results. " Patient's past medical history and cardiovascular risk factors include: Benign essential hypertension, hyperthyroidism in 2015 without radioactive iodine, anxiety, panic attacks, DJD, hyperlipidemia,  family history of premature CAD (father PCI in his 34s), advanced age, postmenopausal female.  She is referred to the office at the request of Leanna Battles, MD for evaluation of dyspnea.  Initially referred to the office for evaluation of dyspnea that has been present for the last several years and getting progressively worse with reduced functional capacity.  She underwent coronary calcium score which noted mild CAC placing her at the 65th percentile.  Since last office visit she is undergone an exercise treadmill stress test which was overall a low risk study.  However, given her multiple cardiovascular risk factors including premature family history of CAD, elevated estimated 10-year risk of ASCVD, mild CAC placing patient at the 65th percentile, we discussed statin therapy at the last visit.  Patient states that she still reluctant to be on either statin or nonstatin therapy and would like to hold off on/pharmacological therapy.    She denies any chest pain and shortness of breath has essentially resolved as well.  ALLERGIES: Allergies  Allergen Reactions   Nitrofuran Derivatives     Became very sick    MEDICATION LIST PRIOR TO VISIT: Current Meds  Medication Sig   ALPRAZolam  (XANAX) 0.25 MG tablet Take 1 tablet (0.25 mg total) by mouth daily as needed. for anxiety (Patient taking differently: Take 0.25 mg by mouth daily as needed for anxiety.)   amLODipine (NORVASC) 10 MG tablet Take 1 tablet by mouth once daily   aspirin EC 81 MG tablet Take 81 mg by mouth daily. Swallow whole.   escitalopram (LEXAPRO) 10 MG tablet Take 10 mg by mouth daily.   estradiol (ESTRACE) 1 MG tablet Take 1 tablet by mouth twice daily   gabapentin (NEURONTIN) 300 MG capsule Take 1 capsule by mouth at bedtime as needed (pain).   hydrochlorothiazide (HYDRODIURIL) 25 MG tablet Take 1 tablet by mouth once daily (Patient taking differently: Take 12.5 mg by mouth daily.)   methimazole (TAPAZOLE) 5 MG tablet TAKE 1 TABLET BY MOUTH THREE TIMES A WEEK   omeprazole (PRILOSEC) 40 MG capsule Take 1 capsule (40 mg total) by mouth daily.   promethazine (PHENERGAN) 25 MG tablet Take 1 tablet (25 mg total) by mouth every 6 (six) hours as needed for nausea or vomiting.     PAST MEDICAL HISTORY: Past Medical History:  Diagnosis Date   Anxiety    DJD (degenerative joint disease)    Of the back   Hypertension    Thyroid disease    hyperthyroid    PAST SURGICAL HISTORY: Past Surgical History:  Procedure Laterality Date   ABDOMINAL HYSTERECTOMY     BACK SURGERY  01/04/2008   On L4 and L5   BIOPSY  10/21/2020   Procedure: BIOPSY;  Surgeon: Jackquline Denmark, MD;  Location: [facility];  Service: Endoscopy;;  COLONOSCOPY     COLONOSCOPY WITH PROPOFOL N/A 10/21/2020   Procedure: COLONOSCOPY WITH PROPOFOL;  Surgeon: Jackquline Denmark, MD;  Location: [facility];  Service: Endoscopy;  Laterality: N/A;   ESOPHAGOGASTRODUODENOSCOPY (EGD) WITH PROPOFOL N/A 10/21/2020   Procedure: ESOPHAGOGASTRODUODENOSCOPY (EGD) WITH PROPOFOL;  Surgeon: Jackquline Denmark, MD;  Location: [facility];  Service: Endoscopy;  Laterality: N/A;   NASAL SEPTUM SURGERY     OTHER SURGICAL HISTORY  03/05/1996   Hysterectomy   PAROTIDECTOMY   03/05/1988   POLYPECTOMY  10/21/2020   Procedure: POLYPECTOMY;  Surgeon: Jackquline Denmark, MD;  Location: [facility];  Service: Endoscopy;;    FAMILY HISTORY: The patient family history includes Atrial fibrillation in her mother; Cancer in her father; Diabetes in her father; Heart attack in her father; Heart disease in her father; Hypertension in her father.  SOCIAL HISTORY:  The patient  reports that she has never smoked. She has never used smokeless tobacco. She reports that she does not currently use alcohol after a past usage of about 6.0 standard drinks per week. She reports that she does not currently use drugs after having used the following drugs: Marijuana.  REVIEW OF SYSTEMS: Review of Systems  Constitutional: Negative for chills and fever.  HENT:  Negative for hoarse voice and nosebleeds.   Eyes:  Negative for discharge, double vision and pain.  Cardiovascular:  Negative for chest pain, claudication, dyspnea on exertion, leg swelling, near-syncope, orthopnea, palpitations, paroxysmal nocturnal dyspnea and syncope.  Respiratory:  Negative for hemoptysis and shortness of breath.   Endocrine: Negative for heat intolerance.  Musculoskeletal:  Negative for muscle cramps and myalgias.  Gastrointestinal:  Negative for abdominal pain, constipation, diarrhea, hematemesis, hematochezia, melena, nausea and vomiting.  Neurological:  Negative for dizziness and light-headedness.  Psychiatric/Behavioral:  The patient is not nervous/anxious.    PHYSICAL EXAM: Vitals with BMI 01/18/2021 12/05/2020 10/28/2020  Height 5\' 5"  5\' 5"  -  Weight 136 lbs 6 oz 134 lbs 10 oz -  BMI 76.2 26.3 -  Systolic 335 456 256  Diastolic 75 75 83  Pulse 70 76 96    CONSTITUTIONAL: Well-developed and well-nourished. No acute distress.  SKIN: Skin is warm and dry. No rash noted. No cyanosis. No pallor. No jaundice HEAD: Normocephalic and atraumatic.  EYES: No scleral icterus MOUTH/THROAT: Moist oral membranes.   NECK: No JVD present. No thyromegaly noted. No carotid bruits  LYMPHATIC: No visible cervical adenopathy.  CHEST Normal respiratory effort. No intercostal retractions  LUNGS: Clear to auscultation bilaterally.  No stridor. No wheezes. No rales.  CARDIOVASCULAR: Tachycardic, regular, positive L8-L3, extrasystolic beats appreciated, no gallops or rubs. ABDOMINAL: Nonobese, soft, nontender, nondistended, positive bowel sounds in all 4 quadrants, no apparent ascites.  EXTREMITIES: No peripheral edema HEMATOLOGIC: No significant bruising NEUROLOGIC: Oriented to person, place, and time. Nonfocal. Normal muscle tone.  PSYCHIATRIC: Normal mood and affect. Normal behavior. Cooperative  CARDIAC DATABASE: EKG: 10/28/2020: Normal sinus rhythm, 90 bpm, without underlying ischemia or injury pattern.  Echocardiogram: 11/02/2020:  Left ventricle cavity is normal in size and wall thickness. Hyperdynamic LV systolic function with visual EF >70%. Normal diastolic filling pattern.  No significant valvular abnormality.  No evidence of pulmonary hypertension.   Stress Testing: Exercise treadmill stress test 12/30/2020: Functional status: Average. Chest pain: No. Reason for stopping exercise: Fatigue/weakness. Hypertensive response to exercise: No. Exercise time 6 minutes 13 seconds on Bruce protocol, achieved 7.36 METS, 102% of APMHR.  Stress ECG negative for ischemia. Low risk study.  Heart Catheterization: None  CT Cardiac Scoring:  11/19/2020 Total CAC 20.1 AU, 65th percentile for patient's age, sex, and race.  7 day extended Holter monitor:  Dominant rhythm normal sinus. Heart rate 47-156 bpm. Avg HR 64 bpm. No atrial fibrillation, ventricular tachycardia, high grade AV block, pauses (3 seconds or longer). Total ventricular ectopic burden <1%. Total supraventricular ectopic burden <1%. Patient triggered events: 0.   LABORATORY DATA: CBC Latest Ref Rng & Units 10/21/2020 10/20/2020 10/19/2020   WBC 4.0 - 10.5 K/uL 9.4 8.6 -  Hemoglobin 12.0 - 15.0 g/dL 14.4 13.5 17.3(H)  Hematocrit 36.0 - 46.0 % 43.9 39.3 51.0(H)  Platelets 150 - 400 K/uL 361 316 -    CMP Latest Ref Rng & Units 11/15/2020 10/21/2020 10/20/2020  Glucose 65 - 99 mg/dL 96 85 111(H)  BUN 8 - 27 mg/dL 13 6(L) 7(L)  Creatinine 0.57 - 1.00 mg/dL 0.68 0.37(L) 0.47  Sodium 134 - 144 mmol/L 139 138 136  Potassium 3.5 - 5.2 mmol/L 4.1 3.5 3.4(L)  Chloride 96 - 106 mmol/L 97 106 103  CO2 20 - 29 mmol/L 23 22 27   Calcium 8.7 - 10.3 mg/dL 10.9(H) 9.6 8.9  Total Protein 6.5 - 8.1 g/dL - - 5.6(L)  Total Bilirubin 0.3 - 1.2 mg/dL - - 1.0  Alkaline Phos 38 - 126 U/L - - 55  AST 15 - 41 U/L - - 12(L)  ALT 0 - 44 U/L - - 12    Lipid Panel  Lab Results  Component Value Date   CHOL 254 (H) 08/27/2018   HDL 85 08/27/2018   LDLCALC 144 (H) 08/27/2018   LDLDIRECT 127.1 04/30/2012   TRIG 128 08/27/2018   CHOLHDL 3.0 08/27/2018     No components found for: NTPROBNP Recent Labs    11/15/20 1203  PROBNP 62   Recent Labs    04/11/20 1638 10/19/20 2300 10/20/20 0315  TSH 0.60 0.473 0.456    BMP Recent Labs    10/19/20 2325 10/20/20 0315 10/21/20 0532 11/15/20 1203  NA 135 136 138 139  K 3.7 3.4* 3.5 4.1  CL 102 103 106 97  CO2 26 27 22 23   GLUCOSE 110* 111* 85 96  BUN 7* 7* 6* 13  CREATININE 0.50 0.47 0.37* 0.68  CALCIUM 8.9 8.9 9.6 10.9*  GFRNONAA >60 >60 >60  --     HEMOGLOBIN A1C Lab Results  Component Value Date   HGBA1C 5.8 10/27/2019   MPG 108 08/27/2018    External Labs: Collected: 11/24/2020 provided by the patient. Total cholesterol 217, triglycerides 113, HDL 67, LDL 127, non-HDL 150. Apolipoprotein B 104 (normal levels 49-90) TSH 0.58, free T4 1.0 (within normal limits) Sodium 136, potassium 4.2, chloride 102, bicarb 24, BUN 16, creatinine 0.7. AST 16, ALT 16, alkaline phosphatase 58. Hemoglobin 15.3 g/dL, hematocrit 45.8%  IMPRESSION:    ICD-10-CM   1. Coronary atherosclerosis  due to calcified coronary lesion  I25.10    I25.84     2. Pure hypercholesterolemia  E78.00     3. Family history of premature CAD  Z82.49     4. Hyperthyroidism  E05.90         RECOMMENDATIONS: Anna Moore is a 65 y.o. female whose past medical history and cardiac risk factors include: Benign essential hypertension, hyperthyroidism in 2015 without radioactive iodine, anxiety, panic attacks, DJD, hyperlipidemia,  family history of premature CAD (father PCI in his 78s), advanced age, postmenopausal female.  Coronary atherosclerosis due to calcified coronary lesion Total CAC 20.1 AU, 65th  percentile for patient's age, sex, and race. Given her coronary calcium score, estimated 10-year risk of ASCVD 7.2%, LDL 127mg /dL, elevated apolipoprotein levels, and family history of premature CAD recommended aspirin and statin therapy. Has tolerated the initiation of aspirin 81 mg p.o. daily well. Patient remains hesitant with regards to initiating statin or nonstatin therapy.  Exercise treadmill stress test: Low risk.  Monitor for now. Educated in the importance of secondary prevention.  Pure hypercholesterolemia Patient's LDL levels are within acceptable ranges; however, given her other risk factors as mentioned above would recommend statin therapy. Still hesitant with regards to initiating statin therapy.  She will discuss it further with PCP. We also discussed nonstatin therapy such as Zetia, Nexletol, Nexlizet. She will look into that a bit more in detail Patient was considering starting fenofibrate.  However this may not be the ideal medication of choice as her triglyceride levels are currently at goal.  Hyperthyroidism Currently on methimazole. May consider establishing care with endocrinology-will defer to PCP  Hypertension: Home blood pressures are very well controlled. Blood pressure log reviewed. Medications reconciled. Low-salt diet recommended.  FINAL MEDICATION LIST END OF  ENCOUNTER: No orders of the defined types were placed in this encounter.   There are no discontinued medications.    Current Outpatient Medications:    ALPRAZolam (XANAX) 0.25 MG tablet, Take 1 tablet (0.25 mg total) by mouth daily as needed. for anxiety (Patient taking differently: Take 0.25 mg by mouth daily as needed for anxiety.), Disp: 30 tablet, Rfl: 0   amLODipine (NORVASC) 10 MG tablet, Take 1 tablet by mouth once daily, Disp: 90 tablet, Rfl: 0   aspirin EC 81 MG tablet, Take 81 mg by mouth daily. Swallow whole., Disp: , Rfl:    escitalopram (LEXAPRO) 10 MG tablet, Take 10 mg by mouth daily., Disp: , Rfl:    estradiol (ESTRACE) 1 MG tablet, Take 1 tablet by mouth twice daily, Disp: 180 tablet, Rfl: 0   gabapentin (NEURONTIN) 300 MG capsule, Take 1 capsule by mouth at bedtime as needed (pain)., Disp: , Rfl:    hydrochlorothiazide (HYDRODIURIL) 25 MG tablet, Take 1 tablet by mouth once daily (Patient taking differently: Take 12.5 mg by mouth daily.), Disp: 90 tablet, Rfl: 0   methimazole (TAPAZOLE) 5 MG tablet, TAKE 1 TABLET BY MOUTH THREE TIMES A WEEK, Disp: 36 tablet, Rfl: 0   omeprazole (PRILOSEC) 40 MG capsule, Take 1 capsule (40 mg total) by mouth daily., Disp: 90 capsule, Rfl: 1   promethazine (PHENERGAN) 25 MG tablet, Take 1 tablet (25 mg total) by mouth every 6 (six) hours as needed for nausea or vomiting., Disp: 12 tablet, Rfl: 0  No orders of the defined types were placed in this encounter.   There are no Patient Instructions on file for this visit.   --Continue cardiac medications as reconciled in final medication list. --Return in about 1 year (around 01/18/2022) for Follow up, Coronary artery calcification,secondary prevention. . Or sooner if needed. --Continue follow-up with your primary care physician regarding the management of your other chronic comorbid conditions.  Patient's questions and concerns were addressed to her satisfaction. She voices understanding of the  instructions provided during this encounter.   This note was created using a voice recognition software as a result there may be grammatical errors inadvertently enclosed that do not reflect the nature of this encounter. Every attempt is made to correct such errors.  Rex Kras, [place], [facility]  Pager: [phone] Office: [phone]

## 2021-04-05 ENCOUNTER — Encounter: Payer: Self-pay | Admitting: Gastroenterology

## 2021-04-05 ENCOUNTER — Ambulatory Visit (INDEPENDENT_AMBULATORY_CARE_PROVIDER_SITE_OTHER): Payer: Medicare Other | Admitting: Gastroenterology

## 2021-04-05 VITALS — BP 140/86 | HR 98 | Ht 66.0 in | Wt 136.8 lb

## 2021-04-05 DIAGNOSIS — K2100 Gastro-esophageal reflux disease with esophagitis, without bleeding: Secondary | ICD-10-CM | POA: Diagnosis not present

## 2021-04-05 MED ORDER — OMEPRAZOLE 40 MG PO CPDR: 40.0000 mg | DELAYED_RELEASE_CAPSULE | Freq: Every day | ORAL | 1 refills | Status: DC

## 2021-04-05 NOTE — Progress Notes (Signed)
04/05/2021 Anna Moore [id number] 1955/04/03   HISTORY OF PRESENT ILLNESS: This is a 66 year old female who is a patient of Dr. Ardis Hughs.  She is here today for hospital follow-up from back in August 2022.  She was hospitalized after trying to drink bowel prep for her colonoscopy and had a lot of vomiting with electrolyte abnormalities.  She ended up having EGD and colonoscopy while in the hospital with Dr. Lyndel Safe.  EGD revealed grade A esophagitis.  Colonoscopy showed 5 polyps that were tubular adenomas.  Esophageal biopsies showed severely active chronic nonspecific esophagitis with ulceration, negative for metaplasia or dysplasia.  Gastric and duodenal biopsies were normal.  No H. pylori.  She has been on omeprazole 40 mg daily.  She says that she feels well with that medication.  She not having any issues at all at this time.   Past Medical History:  Diagnosis Date   Anxiety    DJD (degenerative joint disease)    Of the back   Hypertension    Thyroid disease    hyperthyroid   Past Surgical History:  Procedure Laterality Date   ABDOMINAL HYSTERECTOMY     BACK SURGERY  01/04/2008   On L4 and L5   BIOPSY  10/21/2020   Procedure: BIOPSY;  Surgeon: Jackquline Denmark, MD;  Location: [facility];  Service: Endoscopy;;   COLONOSCOPY     COLONOSCOPY WITH PROPOFOL N/A 10/21/2020   Procedure: COLONOSCOPY WITH PROPOFOL;  Surgeon: Jackquline Denmark, MD;  Location: [facility];  Service: Endoscopy;  Laterality: N/A;   ESOPHAGOGASTRODUODENOSCOPY (EGD) WITH PROPOFOL N/A 10/21/2020   Procedure: ESOPHAGOGASTRODUODENOSCOPY (EGD) WITH PROPOFOL;  Surgeon: Jackquline Denmark, MD;  Location: [facility];  Service: Endoscopy;  Laterality: N/A;   NASAL SEPTUM SURGERY     OTHER SURGICAL HISTORY  03/05/1996   Hysterectomy   PAROTIDECTOMY  03/05/1988   POLYPECTOMY  10/21/2020   Procedure: POLYPECTOMY;  Surgeon: Jackquline Denmark, MD;  Location: [facility];  Service: Endoscopy;;    reports that she has never  smoked. She has never used smokeless tobacco. She reports that she does not currently use alcohol after a past usage of about 6.0 standard drinks per week. She reports that she does not currently use drugs after having used the following drugs: Marijuana. family history includes Atrial fibrillation in her mother; Cancer in her father; Diabetes in her father; Heart attack in her father; Heart disease in her father; Hypertension in her father. Allergies  Allergen Reactions   Nitrofuran Derivatives     Became very sick      Outpatient Encounter Medications as of 04/05/2021  Medication Sig   ALPRAZolam (XANAX) 0.25 MG tablet Take 1 tablet (0.25 mg total) by mouth daily as needed. for anxiety (Patient taking differently: Take 0.25 mg by mouth daily as needed for anxiety.)   amLODipine (NORVASC) 10 MG tablet Take 1 tablet by mouth once daily   aspirin EC 81 MG tablet Take 81 mg by mouth daily. Swallow whole.   escitalopram (LEXAPRO) 10 MG tablet Take 10 mg by mouth daily.   estradiol (ESTRACE) 1 MG tablet Take 1 tablet by mouth twice daily   gabapentin (NEURONTIN) 300 MG capsule Take 1 capsule by mouth at bedtime as needed (pain).   hydrochlorothiazide (HYDRODIURIL) 25 MG tablet Take 1 tablet by mouth once daily (Patient taking differently: Take 12.5 mg by mouth daily.)   methimazole (TAPAZOLE) 5 MG tablet TAKE 1 TABLET BY MOUTH THREE TIMES A WEEK   omeprazole (PRILOSEC) 40 MG  capsule Take 1 capsule (40 mg total) by mouth daily.   promethazine (PHENERGAN) 25 MG tablet Take 1 tablet (25 mg total) by mouth every 6 (six) hours as needed for nausea or vomiting.   No facility-administered encounter medications on file as of 04/05/2021.     REVIEW OF SYSTEMS  : All other systems reviewed and negative except where noted in the History of Present Illness.   PHYSICAL EXAM: BP 140/86    Pulse 98    Ht 5\' 6"  (1.676 m)    Wt 136 lb 12.8 oz (62.1 kg)    SpO2 96%    BMI 22.08 kg/m  General: Well developed  white female in no acute distress Head: Normocephalic and atraumatic Eyes:  Sclerae anicteric, conjunctiva pink. Ears: Normal auditory acuity Lungs: Clear throughout to auscultation; no W/R/R. Heart: Regular rate and rhythm; no M/R/G. Abdomen: Soft, non-distended.  BS present.  Non-tender. Musculoskeletal: Symmetrical with no gross deformities  Skin: No lesions on visible extremities Extremities: No edema  Neurological: Alert oriented x 4, grossly non-focal  Psychological:  Alert and cooperative. Normal mood and affect  ASSESSMENT AND PLAN: *GERD and Grade A esophagitis on EGD in August 2022.  Has been on omeprazole 40 mg daily with great control of symptoms.  She will continue this.  She says that she does not need a new prescription at this time, but she will contact us or have the pharmacy contact us when she needs more medication.  Follow-up in about a year for further refills or sooner if needed for any other issues.   CC:  Tisovec, Fransico Him, MD

## 2021-04-05 NOTE — Progress Notes (Signed)
I agree with the above note, plan 

## 2021-04-05 NOTE — Patient Instructions (Addendum)
If you are age 67 or older, your body mass index should be between 23-30. Your Body mass index is 22.08 kg/m. If this is out of the aforementioned range listed, please consider follow up with your Primary Care Provider. ________________________________________________________  The [place] [facility] providers would like to encourage you to use [facility] to communicate with providers for non-urgent requests or questions.  Due to long hold times on the telephone, sending your provider a message by [facility] may be a faster and more efficient way to get a response.  Please allow 48 business hours for a response.  Please remember that this is for non-urgent requests.  _______________________________________________________  Continue Omeprazole 40 mg daily.  Follow up as needed.  Thank you for entrusting me with your care and choosing [facility].  Alonza Bogus, PA-C

## 2021-11-04 ENCOUNTER — Other Ambulatory Visit: Payer: Self-pay | Admitting: Gastroenterology

## 2021-12-22 DIAGNOSIS — Z1231 Encounter for screening mammogram for malignant neoplasm of breast: Secondary | ICD-10-CM | POA: Diagnosis not present

## 2022-01-11 DIAGNOSIS — E0590 Thyrotoxicosis, unspecified without thyrotoxic crisis or storm: Secondary | ICD-10-CM | POA: Diagnosis not present

## 2022-01-11 DIAGNOSIS — R5383 Other fatigue: Secondary | ICD-10-CM | POA: Diagnosis not present

## 2022-01-11 DIAGNOSIS — E7800 Pure hypercholesterolemia, unspecified: Secondary | ICD-10-CM | POA: Diagnosis not present

## 2022-01-11 DIAGNOSIS — I10 Essential (primary) hypertension: Secondary | ICD-10-CM | POA: Diagnosis not present

## 2022-01-11 DIAGNOSIS — M85851 Other specified disorders of bone density and structure, right thigh: Secondary | ICD-10-CM | POA: Diagnosis not present

## 2022-01-18 DIAGNOSIS — M47816 Spondylosis without myelopathy or radiculopathy, lumbar region: Secondary | ICD-10-CM | POA: Diagnosis not present

## 2022-01-18 DIAGNOSIS — R82998 Other abnormal findings in urine: Secondary | ICD-10-CM | POA: Diagnosis not present

## 2022-01-18 DIAGNOSIS — Z0000 Encounter for general adult medical examination without abnormal findings: Secondary | ICD-10-CM | POA: Diagnosis not present

## 2022-01-18 DIAGNOSIS — J449 Chronic obstructive pulmonary disease, unspecified: Secondary | ICD-10-CM | POA: Diagnosis not present

## 2022-01-18 DIAGNOSIS — I10 Essential (primary) hypertension: Secondary | ICD-10-CM | POA: Diagnosis not present

## 2022-01-18 DIAGNOSIS — Z23 Encounter for immunization: Secondary | ICD-10-CM | POA: Diagnosis not present

## 2022-01-18 DIAGNOSIS — E0590 Thyrotoxicosis, unspecified without thyrotoxic crisis or storm: Secondary | ICD-10-CM | POA: Diagnosis not present

## 2022-01-18 DIAGNOSIS — E7800 Pure hypercholesterolemia, unspecified: Secondary | ICD-10-CM | POA: Diagnosis not present

## 2022-02-21 ENCOUNTER — Ambulatory Visit: Payer: Medicare Other | Admitting: Cardiology

## 2022-07-18 DIAGNOSIS — K080 Exfoliation of teeth due to systemic causes: Secondary | ICD-10-CM | POA: Diagnosis not present

## 2022-09-03 DIAGNOSIS — H5213 Myopia, bilateral: Secondary | ICD-10-CM | POA: Diagnosis not present

## 2022-09-03 DIAGNOSIS — H5211 Myopia, right eye: Secondary | ICD-10-CM | POA: Diagnosis not present

## 2022-09-03 DIAGNOSIS — H2513 Age-related nuclear cataract, bilateral: Secondary | ICD-10-CM | POA: Diagnosis not present

## 2022-11-19 DIAGNOSIS — H0279 Other degenerative disorders of eyelid and periocular area: Secondary | ICD-10-CM | POA: Diagnosis not present

## 2022-11-19 DIAGNOSIS — H02413 Mechanical ptosis of bilateral eyelids: Secondary | ICD-10-CM | POA: Diagnosis not present

## 2022-11-19 DIAGNOSIS — H02834 Dermatochalasis of left upper eyelid: Secondary | ICD-10-CM | POA: Diagnosis not present

## 2022-11-19 DIAGNOSIS — H02831 Dermatochalasis of right upper eyelid: Secondary | ICD-10-CM | POA: Diagnosis not present

## 2022-12-25 DIAGNOSIS — Z1231 Encounter for screening mammogram for malignant neoplasm of breast: Secondary | ICD-10-CM | POA: Diagnosis not present

## 2022-12-31 DIAGNOSIS — H53483 Generalized contraction of visual field, bilateral: Secondary | ICD-10-CM | POA: Diagnosis not present

## 2023-01-21 DIAGNOSIS — Z79899 Other long term (current) drug therapy: Secondary | ICD-10-CM | POA: Diagnosis not present

## 2023-01-21 DIAGNOSIS — E0590 Thyrotoxicosis, unspecified without thyrotoxic crisis or storm: Secondary | ICD-10-CM | POA: Diagnosis not present

## 2023-01-21 DIAGNOSIS — E7800 Pure hypercholesterolemia, unspecified: Secondary | ICD-10-CM | POA: Diagnosis not present

## 2023-01-21 DIAGNOSIS — I10 Essential (primary) hypertension: Secondary | ICD-10-CM | POA: Diagnosis not present

## 2023-01-22 DIAGNOSIS — K080 Exfoliation of teeth due to systemic causes: Secondary | ICD-10-CM | POA: Diagnosis not present

## 2023-01-27 DIAGNOSIS — Z1212 Encounter for screening for malignant neoplasm of rectum: Secondary | ICD-10-CM | POA: Diagnosis not present

## 2023-01-28 DIAGNOSIS — Z23 Encounter for immunization: Secondary | ICD-10-CM | POA: Diagnosis not present

## 2023-01-28 DIAGNOSIS — I10 Essential (primary) hypertension: Secondary | ICD-10-CM | POA: Diagnosis not present

## 2023-01-28 DIAGNOSIS — J449 Chronic obstructive pulmonary disease, unspecified: Secondary | ICD-10-CM | POA: Diagnosis not present

## 2023-01-28 DIAGNOSIS — Z0000 Encounter for general adult medical examination without abnormal findings: Secondary | ICD-10-CM | POA: Diagnosis not present

## 2023-01-28 DIAGNOSIS — R82998 Other abnormal findings in urine: Secondary | ICD-10-CM | POA: Diagnosis not present

## 2023-02-11 DIAGNOSIS — H02831 Dermatochalasis of right upper eyelid: Secondary | ICD-10-CM | POA: Diagnosis not present

## 2023-02-11 DIAGNOSIS — H02834 Dermatochalasis of left upper eyelid: Secondary | ICD-10-CM | POA: Diagnosis not present

## 2023-08-06 DIAGNOSIS — K080 Exfoliation of teeth due to systemic causes: Secondary | ICD-10-CM | POA: Diagnosis not present

## 2023-08-14 DIAGNOSIS — K080 Exfoliation of teeth due to systemic causes: Secondary | ICD-10-CM | POA: Diagnosis not present

## 2023-12-26 DIAGNOSIS — Z1231 Encounter for screening mammogram for malignant neoplasm of breast: Secondary | ICD-10-CM | POA: Diagnosis not present

## 2024-01-08 DIAGNOSIS — H5213 Myopia, bilateral: Secondary | ICD-10-CM | POA: Diagnosis not present

## 2024-01-08 DIAGNOSIS — H2513 Age-related nuclear cataract, bilateral: Secondary | ICD-10-CM | POA: Diagnosis not present

## 2024-02-04 DIAGNOSIS — R82998 Other abnormal findings in urine: Secondary | ICD-10-CM | POA: Diagnosis not present

## 2024-02-12 DIAGNOSIS — K080 Exfoliation of teeth due to systemic causes: Secondary | ICD-10-CM | POA: Diagnosis not present
